# Patient Record
Sex: Female | Born: 1955 | Race: White | Hispanic: No | Marital: Married | Smoking: Never smoker
Health system: Northeastern US, Academic
[De-identification: ages and names within clinical notes are randomized; demographics above are authoritative.]

---

## 2015-05-04 ENCOUNTER — Ambulatory Visit

## 2015-05-13 ENCOUNTER — Ambulatory Visit

## 2015-05-13 NOTE — Progress Notes (Signed)
Primary Provider:  [name] MD      History of Present Illness:  [name] is a 60 Year Old Female who presents today for: 6 month f/u   Specialists seen since last visit? Dr Sherlean Foot  Has previsit planning and a huddle been performed on this patient? yes ..................................................................Marland KitchenJoannie Springs RN  May 13, 2015 9:23 AM     This note is generated with a voice-activated system, transcription errors can occur.    here for 6 month check.  Last seen by me in November 2016.  Seen by endocrine on 11/30/14.  History of osteoporosis, had been on alendronate and Actonel.  Advised no treatment now, follow-up one year.  Spine T score better then 2011  Labs done.    GI, on Prilosec.  In November 2015 B12 and magnesium were normal    Citalopram check.  On 20 mg daily.  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 each day  OMEPRAZOLE 20 MG CPDR: 1 by mouth daily  IBUPROFEN 600 MG TABS: 1 by mouth every six hours as needed for pain.  Take with food    Current Allergies- Reviewed during today's visit  PCN (Critical)  SULFA (Critical)  *  TETANUS (Critical)  * FLU VACCINATION (Critical)  * SEASONAL (Mild)    Past Medical History  No hx of mental illness    Surgical History  none  Family History  FH of Colon Cancer: FATHER d 47  fa estranged from her mother at a young age  Mo alive 31 arthritis in [place]  bro d lymphoma  - no breast or ovarian cancer    No FH of mental illness/substance abuse     Social History  Born in [place], emigrated at age 48.  Marital Status:  married--her husb at [employer]  Children:  4  Lives With:  husb, grandson, 2 youngest children.  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, community in [place]   one child with drug problem: h/o incarcerated for flunking drug tests and not coming for prob officer appt  now in jail for 2 yrs  96 yo grandson  Occupation: shift leader at [employer]  during day takes care of her grandchild  65 yo (daughters child) 4 d  Personnel officer of maintenance at [employer]      Risk Factors  Smoking Status:never smoked  Passive smoke exposure: No    Drug use: no  Alcohol use: no  HIV high risk behavior: no    Exercise: No  Times per week: no  Exercise Comments:  "when I can"  Caffeine (drinks/day): 1  Sun exposure: rarely  Seatbelt use (%): 100            Vital Signs     Weight: 118.20 lb. Height: 59  in.    BMI: 23.96  BSA: 1.48    Wt chg: -0.80  Weight: 119 lbs   BMI: 24.12 on 11/30/2014  Temperature: 98.01 deg F.     Temp Site: oral    Pulse rate: 80    Pulse rhythm: regular    Respirations: 16  On Oxygen? No  BP: 112/78, right arm      Patient is experiencing Pain  Location: left shoulder    Intensity: 8    Type: aching    Duration: constant    Comments: Contact list- in chart  Health care proxy- in chart  Med Compliance and SE's: Pt is compliant with meds with no side effects   Pharmacy- correct   Over the counter medications, vitamins or herbal supplements?   citracal, multivitamins, omeprazole, lutein   Pain in left shoulder x many months.     Medications and Allergies Reviewed    Signed: Joannie Springs RN....May 13, 2015 9:25 AM  PHQ 2    Over the last 2 weeks, how often have you been bothered by any of the following problems?  1. Little interest or pleasure in doing things:  0   - Not at all  2. Feeling down, depressed, or hopeless:  0   - Not at all    Barriers of Learning:  Visual, Language, Other  Comments(Visual): glasses  Comments(Language): English, Portugeses  Comments(Other): Feels Safe in her life            Physical Exam    General:      well developed, well nourished, in no acute distress.    Lungs:      clear bilaterally to auscultation.    Heart:      non-displaced PMI, chest non-tender; regular rate and rhythm, S1, S2 without murmurs, rubs, or gallops  Extremities:      left shoulder decr ROM  abduction to 80.  Markedly guarding movement of the left shoulder.   unable to tolerate any pressure on forward extended left arm    Psych:      overall doing well.  Does not cry during the visit.  Steady tone of voice.         Assessment and Plan:     next appointment already scheduled  12/16/15  Mammogram: MAMDIGSC on 04/06/2015.  Bone Density: Complete on 11/16/2014  Colonoscopy: Complete on 08/14/2012.  PAP: SOUT on 11/11/2013.  Hemoccult: negative on 11/23/2010  Above represents pre-visit preparations for this patient.     ~ EPIGASTRIC PAIN:    Symptoms minimal now.  Only takes the omeprazole periodically.  But increase omeprazole to taking daily while you are taking the ibuprofen.     ~ ADJUSTMENT DISORDER WITH ANXIOUS MOOD:    things going better at home and at work.  Hopes to adjust her work hours.  Son is doing remarkably well.  Life is more stable.     ~ OSTEOPOROSIS:    followed by endocrine.  At this point not on any osteoporosis medication.  Plan to repeat bone density in 2018       ~ SHOULDER PAIN, LEFT  x-ray today.  Rotator cuff tear versus impingement syndrome.  Begin ibuprofen 600 mg 3 times daily always with food for 10 days.  If  not improving follow-up with shoulder specialist  orthopedic doctor name given        Impression:            No acute osseous abnormality is identified. Slight lateral downsloping      of the acromion which can cause impingement type symptoms with      shoulder abduction. AC joint osteoarthritis.            Dictated: 05/13/15 1021    Med Compliance and SE's: Pt is compliant with meds with no side effects   Patient/Caregiver understand instructions and plan.    New/Revised  Medications Today:   IBUPROFEN 600 MG TABS (IBUPROFEN) 1 by mouth every six hours as needed for pain.  Take with food        Patient Instructions    next appointment already scheduled  12/16/15  Mammogram: MAMDIGSC on 04/06/2015.  Bone Density: Complete on 11/16/2014  Colonoscopy: Complete on 08/14/2012.  PAP: SOUT on 11/11/2013.  Hemoccult: negative on 11/23/2010  Above represents pre-visit preparations for this patient.     ~ EPIGASTRIC PAIN:    Symptoms minimal now.  Only takes the omeprazole periodically.  But increase omeprazole to taking daily while you are taking the ibuprofen.     ~ ADJUSTMENT DISORDER WITH ANXIOUS MOOD:    things going better at home and at work.  Hopes to adjust her work hours.  Son is doing remarkably well.  Life is more stable. no changes made in citalopram.  Discussed further at next visit     ~ OSTEOPOROSIS:    followed by endocrine.  At this point not on any osteoporosis medication.  Plan to repeat bone density in 2018       ~ SHOULDER PAIN, LEFT  x-ray today.  Rotator cuff tear versus impingement syndrome.  Begin ibuprofen 600 mg 3 times daily always with food for 10 days.  If not improving follow-up with shoulder specialist orthopedic doctor name given.      Prescriptions:  IBUPROFEN 600 MG TABS (IBUPROFEN) 1 by mouth every six hours as needed for pain.  Take with food  #30 x 1   Entered and Authorized by: [name] MD   Signed by: [name] MD on 05/13/2015   Method used: Electronically to      [facility] (retail)     [address] [place],  [place]  [id number]     Ph: [phone]     Fax: [phone]   RxID: [id number]  OMEPRAZOLE 20 MG CPDR (OMEPRAZOLE) 1 by mouth daily  #30[Unspecified] x 3   Entered by: Joannie Springs RN   Authorized by: [name] MD   Signed by: [name] MD on 05/13/2015   Method used: Electronically to      [facility] (retail)     [address] [place], [place]  [id number]     Ph: [phone]     Fax: [phone]   RxID: [id number]  CITALOPRAM HYDROBROMIDE 20 MG TABS (CITALOPRAM HYDROBROMIDE) one p.o. each day  #30 x 6   Entered by: Joannie Springs RN   Authorized by: [name] MD   Signed by: [name] MD on 05/13/2015   Method used: Electronically to      [facility] (retail)     [address] [place], [place]  [id number]     Ph: [phone]     Fax: [phone]  RxID: [id number]

## 2015-05-13 NOTE — Progress Notes (Signed)
[facility] - [name], MD   [address]   [place], [place] [address]  Office: [phone] Fax: [phone]          05/13/2015    [name]  [address]  [place], [place]  [address]    Dear [name]:    The x-rays of the left shoulder showed no fracture.  There is signs of impingement and/or rotator cuff abnormalities.  If the ibuprofen does not help please follow up with orthopedics.            Sincerely,          [name] MD

## 2015-05-16 ENCOUNTER — Ambulatory Visit

## 2015-05-16 NOTE — Progress Notes (Signed)
[facility] - [name], MD   [address]   [place], [place] [address]  Office: [phone] Fax: [phone]          05/16/2015    Kashina Edward Jolly  [address]  [place], [place]  [address]    Dear Ms. Edward Jolly:    The x-ray of the left shoulder shows signs of rotator cuff issues and possible nerve impingement.  Please follow up with orthopedics if you are not improving on the ibuprofen.  I have given you the name of the specialist.            Sincerely,          [name] MD

## 2015-05-17 ENCOUNTER — Ambulatory Visit

## 2015-05-17 NOTE — Progress Notes (Signed)
Referring MD: Dr. [name]     Type of Visit:  New Patient     Chief Complaint: Left Shoulder Pain       History of Present Illness  Dear Dr Parke Poisson,     Thank you for referring your patient [name] for evaluation of her shoulder pain.    As you know, [name] is a 60 year-old RHD female who presents with five months of left shoulder pain and limited range of motion. Today she presents with a localized stabbing pain on the superior lateral aspect of her shoulder. It does not radiate. She rates the pain seven 8/10. Subjective shoulder value is 80%. She has pain when putting on a jacket as well as reaching behind her. She has had no change last five months. She is tried heat and. Screams, and ibuprofen. These seemed to help somewhat. Pain is worse at night. She denies numbness or tingling in her hand. She occasionally gets neck pain.    Past Medical History  No Significant Medical History    Past Surgeries  No Past Surgery  Current Medication List:  1. Ibuprofen 600 mg oral tabs   2. Omeprazole 20 mg oral cpdr   3. Citalopram hydrobromide 20 mg oral tabs       Current Drug Allergies    PENICILLIN  SULFA  TETANUS  FLU VACCINE  Done    Social History   Marital Status: Married  Risk Factors   Tobacco use: never smoker  Alcohol use: Never  Drug use: Never  Exercise/Athletics: Yes  Family History   Cancer    Review of Systems   Cardio: Negative Endo: Negative ENT: Negative Eyes: Negative  GI: Acid Reflux Heme/Lymph: Negative Skin: Negative Neuro: Negative  Psych: Negative Resp: Negative Uro: Negative Other: Patient provided educational material regarding their condition including BMI and smoking cessation as necessary.     Weight: 118 lb   Height: 59 in  BMI: 23.92  Blood Pressure: 122 / 70 mm Hg    Physical Exam   [name] is a well appearing female in no acute distress. She is alert and oriented times 3. She is walking without a limp. Station is normal.  Cooperative with exam. Mood and affect are normal.    Left shoulder: Skin is  intact, no erythema or ecchymosis. Nontender palpation over the a.c. joint and biceps. Active forward flexion 120, external rotation 60, internal rotation to PSIS. Pain without overt weakness with supraspinatus testing in empty can position, no pain with internal and external rotation with her elbow at her side. Positive speeds test as well as O'Brien's sign. Negative Yocum sign. Negative Hawkins test. Negative apprehension test. She is neurovascular intact distally in her hand.    Right shoulder: Skin intact, no ecchymosis or erythema.  Supple range of motion- active forward flexion 160, external rotation 60, internal rotation to T12.  Intact strength and no pain with supraspinatus testing in empty can position as well as internal/external rotation with elbows at her side.  Negative belly press.  Negative speed's test.  Nontender palpation AC joint and biceps.  Negative apprehension sign.  Neurovascular intact distally in her hand.  Her fingers are warm and well perfused.      Diagnostic Tests   X-Ray Evaluation: Prior xr and report reviewed: Degenerative changes at the [facility] joint, glenohumeral joint space maintained, downsloping acromion.       Assessment and Plan   I discussed my findings and reviewed the x-ray with [name]. We  discussed the possible etiology of her left shoulder pain likely adhesive capsulitis with an underlying rotator cuff tendinitis and biceps tendinitis. Overall her shoulder today is quite stiff, in addition to having symptoms of rotator cuff tendinitis. We discussed these diagnoses. We discussed treatment options particularly for the adhesive capsulitis including anti-inflammatories, cortisone injection, and a home stretching program versus physical therapy. She would like to hold off on the cortisone injection at this time. She will work on a stretching exercise program. I encouraged her to do this three times per day. I'll plan to see her back in two months time for repeat evaluation. If  she would like a cortisone injection, encouraged her to follow up sooner. Her questions were satisfactorily answered. She is in agreement with the plan.    New Orders  Added new Service order of [address] - Detailed Low [phone]) - Signed        cc: Dr. [name]            E-Mail   Subject: A secure message from your provider's office        Electronically signed by Stann Mainland MD on 05/22/2015 at 3:04 PM  ________________________________________________________________________

## 2015-10-22 ENCOUNTER — Ambulatory Visit

## 2015-10-22 NOTE — Telephone Encounter (Signed)
Phone Note -     Pharmacy    **Refill Request Only**    Initial call taken by: Kathaleen Grinder,  October 22, 2015 9:56 AM  Initial Details of Call:  fax request- omeprazole 20mg  1xday #30  [facility] [address] [place] [phone]      Follow-up #1  Details: Last Appointment-05-13-2015  Next Appointment-12-16-2015  Last Refill-05-13-2015     #30 x3  Pharmacy is correct-yes    By: Dorthey Sawyer RN ~ October 22, 2015 10:20 AM    Prescriptions:  OMEPRAZOLE 20 MG CPDR (OMEPRAZOLE) 1 by mouth daily  #30[Capsule] x 2   Entered by: Dorthey Sawyer RN   Authorized by: Sharin Mons MD   Signed by: Sharin Mons MD on 10/22/2015   Method used: Electronically to      [facility] (retail)     [address] [place], [place]  [id number]     Ph: [phone]     Fax: [phone]   RxID: [id number]        Medications:  Rx of OMEPRAZOLE 20 MG CPDR (OMEPRAZOLE) 1 by mouth daily;  #30[Capsule] x 2;  Signed;  Entered by: Dorthey Sawyer RN;  Authorized by: Sharin Mons MD;  Method used: Electronically to [facility] [address], [place], [place]  [id number], Ph: [phone], Fax: [phone]

## 2015-11-09 ENCOUNTER — Ambulatory Visit

## 2015-11-09 NOTE — Progress Notes (Signed)
Endocrinology and Diabetes  [facility]  [address]  [place], [place] [address]  Phone:  [phone]  Fax:  [phone]        November 09, 2015      [name]  [address]  [place], [place] [address]    Dear Ms. Edward Jolly:      Please be advised that due to some conflicts in our schedule, we need to change your scheduled appointment on 11/29/2015 at 9:00 AM.    We apologize for any inconvenience this may cause you.   We have moved your appointment to 01/11/2016 at 8:45 AM.    If you cannot make this appointment, please call our office, and we will be more than happy to reschedule for another date and/or time that is more accommodating to your needs.    Again, thank you for your understanding and anticipated cooperation.     Sincerely,        Rosina Lowenstein, MD  [facility]  [facility] [place] [facility]  [facility]   [facility]

## 2015-12-07 ENCOUNTER — Ambulatory Visit

## 2015-12-16 ENCOUNTER — Ambulatory Visit

## 2015-12-16 NOTE — Progress Notes (Signed)
Clinical Lists Changes    Directives:  Removed directive of CONTACT INFORMATION 10/24/2012 (UPDATED 04/30/14), UPDATED 05/13/15  Added new directive of NEW CONTACT INFORMATION 12/16/15

## 2015-12-16 NOTE — Progress Notes (Signed)
Primary Provider:  [name] MD      History of Present Illness:  [name] is a 60 Year Old Female who presents today for: Complex  Specialists seen since last visit? none  Has previsit planning and a huddle been performed on this patient?yes  ..................................................................Marland KitchenEsau Grew  December 16, 2015 9:01 AM    here for comprehensive visit  Osteoporosis treated intermittently now on break.  Had seen endocrinology 11/2014.  Plan to recheck bone density in 11/2016 and hold off on restarting osteoporosis medicine now  On daily Prilosec for gastroesophageal reflux  Left shoulder impingement saw shoulder orthopedics Dr. Michail Sermon 05/17/15.  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  Cortisone injection at subsequent visit was v helpful.Return in 2 months    Mood disorder on citalopram.  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  Take with food  ERYTHROMYCIN 5 MG/GM OPHTHALMIC OINTMENT: 1/4" to left eye 3 x a day for 5 day  FAMOTIDINE 20 MG ORAL TABLET: 1 tab a day    Current Allergies- Reviewed during today's visit  PCN (Critical)  SULFA (Critical)  * TETANUS (Critical)  * FLU VACCINATION (Critical)  * SEASONAL (Mild)    Past Medical History  No hx of mental illness    Surgical History  none  Family History  FH of Colon Cancer: FATHER d 39  fa estranged from her mother at a young age  Mo alive 29 arthritis in [place]  bro d lymphoma  - no breast or ovarian cancer    No FH of mental illness/substance abuse     Social History  Born in [place], emigrated at age 40.  Marital Status:  married--her husb at [employer]  Children:  4  Lives With:  husb, grandson, 2 youngest children.  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, community in [place]   one child with drug problem: h/o incarcerated for flunking drug tests and not coming for prob officer appt  now in jail for 2 yrs  15 yo grandson  Occupation: shift leader at [employer]  during day takes care of her grandchild  62 yo (daughters child) 4 d  Personnel officer of maintenance at [employer]      Risk Factors  Smoking Status:never smoked  Passive smoke exposure: No    Drug use: no  Alcohol use: no  HIV high risk behavior: no    Exercise: No  Times per week: no  Exercise Comments:  "when I can"    Caffeine (drinks/day): 1  Sun exposure: rarely  Seatbelt use (%): 100            Review of Systems     Gen:  normal energy, sleep  Eye: No changes in vision  ENT: No changes in hearing  Card: No chest pain, pressure, , palpitations, syncope. some shortness of breath.  Pulm: No cough, sputum production, pain with inspiration  GI:  No dyspepsia, abdominal pain, change in bowel habits, blood in stool.  GU: No hesitancy, nocturia, incontinence.  Musc-Skel:  no spine /jt/musc pain  Neuro:  No headaches, dizziness,  memmory loss, falls.  Psych: No anxiety or depression--wants to try to taper celexa  Vital Signs     Weight: 118 lb. Height: 59  in.    BMI: 23.92  BSA: 1.47    Wt chg: -0.20  Weight: 118.20 lbs   BMI: 23.96 on 05/13/2015  Temperature: 98.2 deg F.   Pulse rate: 72    Respirations: 15  BP: 132/80, right arm      Patient is not experiencing pain    Comments: Contact list- in chart  Health care proxy- in chart  Med Compliance and SE's: Pt is compliant with meds with no side effects   Pharmacy- correct   Over the counter medications, vitamins or herbal supplements? calcium, multi  Medications and Allergies Reviewed    Signed: Esau Grew.Marland KitchenMarland KitchenMarland KitchenDecember  7, 2017 9:16 AM  PHQ 2    Over the last 2 weeks, how often have you been bothered by any of the following problems?  1. Little interest or pleasure in doing things:  0   - Not at all  2. Feeling down, depressed, or hopeless:  0   - Not at all    Barriers of Learning:  Visual, Other  Comments(Visual): glasses  Comments(Other): Feels Safe in her life.            Physical Exam    GENERALl:: WD/WN female, in no acute distress  EYE: PERRLA , no conjunctiva abnormality  left eye lower lid inner aspect started developing on outer part of the eye lid-red raised area  ENT: EAC normal, TMs negative.  No sinus tenderness.  Pharynx negative.  NECK: Normal thyroid, no nodules, no adenopathy.  CHEST WALLl: No tenderness, no flank tenderness.   BREASTS: No masses/tender/dimpling  LUNGS: Clear throughout.  No adventitious sounds, moves air well.  COR: S1, S2 normal.  Regular rhythm, no murmurs.  ABDOMEN: Soft, nontender, intact bowel sounds. No hepatosplenomegaly.    Vascular: no carotid bruits.  Extremities: No edema.  No venous stasis change  NEURO oriented, alert, no focal motor deficits  dtr's intact gait intact  Psych  pleasant appropriate, normal affect-no evidence of depression or anxiety.           Assessment and Plan:     follow-up visit 6 months  Defers the flu shot--History  of severe local reaction  Mammogram: MAMDIGSC on 04/06/2015     Pap: SOUT on 11/11/2013.  Bone Density: Complete on 11/16/2014.-- next 11/2016  Colonoscopy: Complete on 08/14/2012.--next 2019  Above represents pre-visit preparations for this patient.     ~ SHOULDER PAIN, LEFT (M25.512):    benefited greatly from cortisone injection.  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  Substitute with famotidine/Pepcid 20 mg a day.  Hopefully insurance will cover this.  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  Better schedule.  Family doing quite well.  Advise decrease citalopram to 15 mg a day for one month.  If you're continuing to do well may drop to 10 mg a day.  Follow-up appointment with me in 6 months.     ~ OSTEOPOROSIS (M81.0):    has appointment to follow up with endocrine on 01/11/16.  Laboratory testing ordered by them.  Bone density to be done at the end of 2018     ~ HORDEOLUM EXTERNUM, LEFT (H00.016):    discussed using baby shampoo Ilotycin ointment for 5 days.  Call if no better      Med Compliance and SE's: Pt is compliant with meds with no side effects   Patient/Caregiver understand instructions and plan.    New/Revised  Medications Today:   CITALOPRAM HYDROBROMIDE 10 MG ORAL TABLET (CITALOPRAM HYDROBROMIDE) 1 1/2 tabs a day  ERYTHROMYCIN 5 MG/GM OPHTHALMIC OINTMENT (ERYTHROMYCIN) 1/4" to left eye 3 x a day for 5 day  FAMOTIDINE 20 MG ORAL TABLET (FAMOTIDINE) 1 tab a day            Patient Instructions    follow-up visit 6 months  Defers the flu shot--History of severe local reaction  Mammogram: MAMDIGSC on 04/06/2015     Pap: SOUT on 11/11/2013.  Bone Density: Complete on 11/16/2014.-- next 11/2016  Colonoscopy: Complete on 08/14/2012.--next 2019  Above represents pre-visit preparations for this patient.     ~ SHOULDER  PAIN, LEFT (M25.512):    benefited greatly from cortisone injection.  No further issues at this time     ~ ADENOMATOUS COLONIC POLYP (D12.6):    up-to-date with colonoscopy     ~ EPIGASTRIC PAIN (R10.13):    discussed stopping Prilosec/omeprazole.  Substitute with famotidine/Pepcid 20 mg a day.  Hopefully insurance will cover this.  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  Better schedule.  Family doing quite well.  Advise decrease citalopram to 15 mg a day for one month.  If you're continuing to do well may drop to 10 mg a day.  Follow-up appointment with me in 6 months.  EKG done today   ~ OSTEOPOROSIS (M81.0):    has appointment to follow up with endocrine on 01/11/16.  Laboratory testing ordered by them.  Bone density to be done at the end of 2018    STYE in the left eye.  Use Ilotycin eye ointment 3 times a day for 5 days.  Use baby shampoo with drops of warm water to cleanse the eye.  Before putting on the eye ointment.      Prescriptions:  CITALOPRAM HYDROBROMIDE 10 MG ORAL TABLET (CITALOPRAM HYDROBROMIDE) 1 1/2 tabs a day  #50[Unspecified] x 5   Entered and Authorized by: [name] MD   Signed by: [name] MD on 12/16/2015   Method used: Electronically to      [facility] (retail)     [address] [place], [place]  [id number]     Ph: [phone]     Fax: [phone]   RxID: [id number]  FAMOTIDINE 20 MG ORAL TABLET (FAMOTIDINE) 1 tab a  day  #30[Unspecified] x 2   Entered and Authorized by: [name] MD   Signed by: [name] MD on 12/16/2015   Method used: Electronically to      [facility] (retail)     [address] [place], [place]  [id number]     Ph: [phone]     Fax: [phone]   RxID: [id number]  ERYTHROMYCIN 5 MG/GM OPHTHALMIC OINTMENT (ERYTHROMYCIN) 1/4" to left eye 3 x a day for 5 day  #1[Tube] x 0   Entered and Authorized by: [name] MD   Signed by: [name] MD on 12/16/2015   Method  used: Electronically to      [facility] (retail)     [address] [place], [place]  [id number]     Ph: [phone]     Fax: [phone]   RxID: [id number]

## 2015-12-17 ENCOUNTER — Ambulatory Visit

## 2016-01-11 ENCOUNTER — Ambulatory Visit

## 2016-01-11 ENCOUNTER — Ambulatory Visit: Admitting: Internal Medicine

## 2016-01-11 LAB — HX BASIC METABOLIC PANELX
HX ANION GAP: 17 mmol/L (ref 9.0–19.0)
HX BICARBONATE: 25 mmol/L (ref 23.0–31.0)
HX BUN: 14 mg/dL (ref 8.0–23.0)
HX CALCIUM: 9.7 mg/dL (ref 8.5–10.5)
HX CHLORIDE: 100 mmol/L (ref 98.0–110.0)
HX CREATININE: 0.7 mg/dL (ref 0.4–1.2)
HX GLOMERULAR FILTRATION RATE: 85
HX GLUCOSE: 118 mg/dL — ABNORMAL HIGH (ref 70.0–100.0)
HX HEMOLYSIS INDEX: 14 mg/dL (ref 0.0–50.0)
HX ICTERIC INDEX: 1 (ref 0.0–2.0)
HX LIPEMIC INDEX: 8 mg/dL (ref 0.0–40.0)
HX POTASSIUM: 4.7 mmol/L (ref 3.6–5.3)
HX SODIUM: 142 mmol/L (ref 137.0–146.0)

## 2016-01-11 LAB — HX VITAMIN D,25 HYDROXY: HX VITAMIN D,25 HYDROXY: 30.6 ng/mL (ref >=30)

## 2016-01-11 NOTE — Progress Notes (Signed)
ENDOCRINOLOGY ENCOUNTER    PCP: [name] MD  Chief Complaint: Osteoporosis  HPI: [name]    89 F with a history of GERD referred in endocrine consultation for osteoporosis at the request of PCP.   She was on alendronate from 2009 to 03-2013.  Had been on risedronate before that. 09-2014 BMD with T scores -2.9 in AP spine and -1.7 in left femur.  She has no history of fracture.  The patient reports puberty occurred at age 61, similar to peers. Periods were regular.  She is G5P4.  Menopause occurred at age 81.  Was briefly on HRT at that time.    At around age 85 she was diagnosed with osteoporosis.  Her dairy intake: occasional yogurt, has almond milk every night.   She is on a Citracal with vitamin D twice daily.  She reports no extended period of prednisone use. She has no history of kidney stones.  She reports no constipation.   No polyuria.    Problem List:   HORDEOLUM EXTERNUM, LEFT (ICD-373.11) (ICD10-H00.016)  SHOULDER PAIN, LEFT (ICD-719.41) (ICD10-M25.512)  PREVENTIVE HEALTH CARE (ICD-V70.0) (ICD10-Z00.00)  BLOOD IN STOOL (ICD-578.1) (XBM84-X32.1)  ADENOMATOUS COLONIC POLYP (ICD-211.3) (ICD10-D12.6)  INSOMNIA (ICD-780.52) (ICD10-G47.00)  EPIGASTRIC PAIN (ICD-789.06) (ICD10-R10.13)  Hx of CHEST PAIN (ICD-786.50) (ICD10-R07.9)  ADJUSTMENT DISORDER WITH ANXIOUS MOOD (ICD-309.24) (ICD10-F43.22)  OSTEOARTHRITIS (ICD-715.90) (ICD10-M19.90)  OSTEOPOROSIS (ICD-733.00) (ICD10-M81.0)  ALLERGY (ICD-995.3) (ICD10-T78.40)  WEIGHT GAIN (ICD-783.1) (ICD10-R63.5)  FAMILY HISTORY OF COLON CANCER (ICD-V16.0) (ICD10-Z80.0)      Medication List:   CITRACAL/VITAMIN D TABLET (CALCIUM CITRATE-VITAMIN D TABS) one tab daily  MULTIVITAMINS TABS (MULTIPLE VITAMIN) once daily  CITALOPRAM HYDROBROMIDE 10 MG ORAL TABLET (CITALOPRAM HYDROBROMIDE) 1 1/2 tabs a day  OMEPRAZOLE 20 MG ORAL CAPSULE DELAYED RELEASE (OMEPRAZOLE) 1 by mouth daily  IBUPROFEN 600 MG ORAL TABLET (IBUPROFEN) 1 by mouth every six hours as needed for pain.   Take with food  ERYTHROMYCIN 5 MG/GM OPHTHALMIC OINTMENT (ERYTHROMYCIN) 1/4" to left eye 3 x a day for 5 day  FAMOTIDINE 20 MG ORAL TABLET (FAMOTIDINE) 1 tab a day      Allergies:    PCN, SULFA, * TETANUS, * FLU VACCINATION, * SEASONAL.      Histories:   Past Medical History: No hx of mental illness    Past Surgical History: none  Social History: Born in [place], emigrated at age 80.  Marital Status:  married--her husb at [employer]  Children:  4  Lives With:  husb, grandson, 2 youngest children.  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 No cold intolerance.  No diarrhea or constipation. No change in hair or skin. No dysphagia, no odynophagia, no solid food or pill dysphagia. No  diplopia/blurry vision.No fevers/chills.No change in voice.No anterior neck pressure. No increase in neck size.No wheeze or DOE.No rash.No edema. No cough.No anxiety/Depression. All other pertinent systems reviewed and are negative      Vital Signs   Weight: 117 lb. (53.18 kg.)  (Weight change since last visit: -1 lbs.)    Height: 59 in. (149.86 cm.)  BMI: 23.72    Blood Pressure: 123 /75 mm Hg.  Postion: sitting  Pulse Rate: 76  Comment: right arm    [facility] [place]......................January 11, 2016 9:00 AM        Physical Exam:   General: General: appears well,NAD  Vitals reviwed-stable  HEENT: No thyromegaly, EOMI  CVS: no  peripheral edema  Resp:norm resp effort  Extremities: No lower extremity edema  Skin: intact, no rash  MS: ambulates well  Neuro: no tremor  Psych: Normal mood and affect.          Laboratory Results:   TSH: 0.80 (05/25/2010 10:17:00 AM)  Vit D 25 37.0 (12/01/2014 8:52:00 AM)  ESR 10 (07/17/2012 10:24:00 AM)  Glucose: 104 (12/01/2014 8:52:00 AM)  Last eye exam: Done  Total Testosterone: Done  Sodium: 142 (12/01/2014 8:52:00 AM)  Potassium: 4.5 (12/01/2014 8:52:00 AM)  Chloride: 101 (12/01/2014 8:52:00 AM)  Bicarbonate 27 (12/01/2014 8:52:00 AM)  BUN: 12 (12/01/2014 8:52:00 AM)  Creatinine: 0.7 (12/01/2014 8:52:00 AM)  Serum Calcium: 9.9 (12/01/2014 8:52:00 AM)  Serum Magnesium: 2.1 (11/11/2013 10:47:00 AM)  Cholesterol: 225 (11/17/2014 7:13:00 AM)  LDL: 115 (11/17/2014 7:13:00 AM)  HDL: 87 (11/17/2014 7:13:00 AM)  Triglyceride: 113 (11/17/2014 7:13:00 AM)    Assessment & Plan:  66 F with a history of GERD referred in endocrine consultation for osteoporosis at the request of PCP.   She was on alendronate from 2009 to 03-2013.  Had been on risedronate before that. 09-2014 BMD with T scores -2.9 in AP spine and -1.7 in left femur.  She has no history of fracture.  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   She was on alendronate for 6 years.  Her spinal BMD has declined since 2014 but probably not significant-it is still higher that it was compared to 2011. Hip BMD essentially stable.  We will check urine NTX to assess again bone turnover and, unless elevated, likely will continue drug holiday.   Will repeat again 11-2016.  We will repeat the bone densitometry in 09-2016.    In the meantime, discussed goal of 1200 mg daily calcium via diet or pill.  A handout was provided (see instructions).   Gets vitamin D via citracal-D and MVI. Recent kidney function, calcium and vitamin D normal.  Will repeat in one year.  Follow up in one year  Copy to referring  provider.    Thank you for involving me in the care of your patient.    Kind regards,    Rosina Lowenstein, MD    Copy of note sent to referring provider  Note generated with dictation software and may contain errors

## 2016-01-12 LAB — HX NTX-TELOPEPTIDE, 24HR URINE
HX NTX CREAT: 38 mg/dL
HX NTX RESULTS: 120 nmol/L
HX NTX-TELOPEPTIDE: 36

## 2016-01-14 ENCOUNTER — Ambulatory Visit

## 2016-01-14 NOTE — Telephone Encounter (Signed)
Phone Note -       Initial call taken by: Rosina Lowenstein MD,  January 14, 2016 1:18 PM  Initial Details of Call:  please let patient know  labs look fine  no need to restart osteop medication for now      Follow-up #1  Details: SPOKE WITH PT  By: Letha Cape Goldthwaite ~ January 17, 2016 7:55 AM

## 2016-04-17 ENCOUNTER — Ambulatory Visit

## 2016-06-15 ENCOUNTER — Ambulatory Visit

## 2016-06-15 NOTE — Progress Notes (Signed)
Primary Provider:  [name] MD      History of Present Illness:  [name] is a 61 Year Old Female who presents today for: Follow-Up Visit  Specialists seen since last visit?     Dr Kathrynn Humble  Has previsit planning and a huddle been performed on this patient?     yes  ..................................................................Marland KitchenDorthey Sawyer RN  June 15, 2016 9:25 AM     This note is generated with a voice-activated system, transcription errors can occur.    presenting today in follow-up.--endocrine generator 2018.  Dr. Antony Blackbird.  Advised she could stay off osteoporosis medicine because scores overall were stable. BD 11/18 scheduled.  On citalopram doing well--tapering --now has been at 5 mg a day  works 20 hrs a week Mon Wed thurs  has all weekends off  Issues with left shoulder impingement in the past post cortisone shots.--no complaints  Gastroesophageal reflux on omeprazole--now better  Due for colonoscopy in August 2019  Due for Pap smear this year    allergies are very bad this yr  dry cough  h/a rare        Current Problems  SHOULDER PAIN, LEFT  PREVENTIVE HEALTH CARE  BLOOD IN STOOL  ADENOMATOUS COLONIC POLYP  INSOMNIA  EPIGASTRIC PAIN  CHEST PAIN  ADJUSTMENT DISORDER WITH ANXIOUS MOOD  OSTEOARTHRITIS  OSTEOPOROSIS  ALLERGY  WEIGHT GAIN  FAMILY HISTORY OF COLON CANCER    Current Medications- Reviewed during today's visit  CITRACAL/VITAMIN D TABLET (CALCIUM CITRATE-VITAMIN D TABS): one tab daily  MULTIVITAMINS TABS (MULTIPLE VITAMIN): once daily  CITALOPRAM HYDROBROMIDE 10 MG ORAL TABLET: 1 1/2 tabs a day  OMEPRAZOLE 20 MG ORAL CAPSULE DELAYED RELEASE: 1 by mouth daily a needed  IBUPROFEN 600 MG ORAL TABLET: 1 by mouth every six hours as needed for pain.  Take with food  FAMOTIDINE 20 MG ORAL TABLET: 1 tab a day    Current Allergies- Reviewed during today's visit  PCN (Critical)  SULFA (Critical)  * TETANUS (Critical)  * FLU VACCINATION (Critical)  * SEASONAL (Mild)    Past Medical  History  No hx of mental illness    Surgical History  none  Family History  FH of Colon Cancer: FATHER d 88  fa estranged from her mother at a young age  Mo alive 18 arthritis in [place]  bro d lymphoma  - no breast or ovarian cancer    No FH of mental illness/substance abuse     Social History  Born in [place], emigrated at age 82.  Marital Status:  married--her husb at [employer]  Children:  4  Lives With:  husb, grandson, 2 youngest children.  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 Height: 59  in.    BMI: 23.80  BSA: 1.47    Wt chg: 0.40  Weight: 117 lbs   BMI: 23.72 on 01/11/2016  Temperature: 98.2 deg F.     Temp Site: oral    Pulse rate: 64    Pulse rhythm: regular    Respirations: 16  On Oxygen? No  Pulse Ox (SpO2): 99 BP: 126/70, right arm      Patient is not experiencing pain    Comments: Contact list- yes  Health care proxy-  No over the counter medications. Takes OTC Vitamins  Med Compliance and SE's: Pt is compliant with meds with no side effects correct  Pharmacy-[facility]    Medications and Allergies Reviewed    Signed: Dorthey Sawyer RN....June 15, 2016 9:28 AM  PHQ 2    Over the last 2 weeks, how often have you been bothered by any of the following problems?  1. Little interest or pleasure in doing things:  0   - Not at all  2. Feeling down,  depressed, or hopeless:  0   - Not at all    Barriers of Learning:  Visual, Other  Comments(Visual): wears eye glasses        Physical Exam    General:      well developed, well nourished, in no acute distress.    Ears:      TM's intact and clear with normal canals with grossly normal hearing.    Mouth:      no deformity or lesions with good dentition.    Lungs:      clear bilaterally to auscultation.    Heart:      non-displaced PMI, chest non-tender; regular rate and rhythm, S1, S2 without murmurs, rubs, or gallops  Abdomen:       normal bowel sounds; no hepatosplenomegaly no ventral,umbilical hernias or masses noted.    Extremities:      no ankle edema  Psych:      pleasant relaxed         Assessment and Plan:     follow up 12/21/16  shingrix vaccine given  Mammogram: MGSCRTOMO on 04/17/2016    on .  Pap: SOUT on 11/11/2013.---plan to do it next visit  Bone Density: Complete on 11/16/2014.  Colonoscopy: Complete on 08/14/2012.--due in August 2019 will need new gastroenterologist  Above represents pre-visit preparations for this patient.     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  Planning to bring her mother to live with her from [place].  Advise decrease Celexa from 5 mg daily to 5 mg every other day for one month then stop.  Call if concerns.     ~ ALLERGY (T78.40) :    allergy symptoms pressing today.  Advised trial of Claritin/loratadine 10 mg a day.  This is a nonsedating antihistamine     ~ EPIGASTRIC PAIN (R10.13) :    symptoms much improved since she has a more manageable schedule      Med Compliance and SE's: Pt is compliant with meds with no side effects     Medications Removed Today:   ERYTHROMYCIN 5 MG/GM OPHTHALMIC OINTMENT (ERYTHROMYCIN) 1/4" to left eye 3 x a day for 5 day    New/Revised  Medications Today:   OMEPRAZOLE 20  MG ORAL CAPSULE DELAYED RELEASE (OMEPRAZOLE) 1 by mouth daily a needed            Patient Instructions    follow up 12/21/16  shingrix vaccine  given  Mammogram: MGSCRTOMO on 04/17/2016    on .  Pap: SOUT on 11/11/2013.---plan to do it next visit  Bone Density: Complete on 11/16/2014.  Colonoscopy: Complete on 08/14/2012.--due in August 2019 will need new gastroenterologist  Above represents pre-visit preparations for this patient.     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  Planning to bring her mother to live with her from [place].  Advise decrease Celexa from 5 mg daily to 5 mg every other day for one month then stop.  Call if concerns.     ~ ALLERGY (T78.40) :    allergy symptoms pressing today.  Advised trial of Claritin/loratadine 10 mg a day.  This is a nonsedating antihistamine     ~ EPIGASTRIC PAIN (R10.13) :    symptoms much improved since she has a more manageable schedule        Prescriptions:  OMEPRAZOLE 20 MG ORAL CAPSULE DELAYED RELEASE (OMEPRAZOLE) 1 by mouth daily a needed  #30 x 0   Entered and Authorized by: [name] MD   Signed by: [name] MD on 06/15/2016   Method used: Faxed to ...     [facility] (retail)     [address] [place], [place]  [id number]     Ph: [phone]     Fax: [phone]   RxID: [id number]            ]

## 2016-12-12 ENCOUNTER — Ambulatory Visit

## 2016-12-21 ENCOUNTER — Ambulatory Visit

## 2016-12-21 NOTE — Progress Notes (Signed)
Primary Provider:  [name] MD      History of Present Illness:  [name] is a 61 Year Old Female who presents today for: [facility]  Specialists seen since last visit? none  Is there an updated release of information to speak for the patient on file (valid for 1 year only)?   Has previsit planning and a huddle been performed on this patient? yes  ..................................................................Marland KitchenEsau Grew  December 21, 2016 9:41 AM     This note is generated with a voice-activated system, transcription errors can occur.    patient is seen today for comprehensive visit.  Osteoporosis,  Mood disorder Celexa was being tapered. she has completely stopped and doing well    Gastroesophageal reflux on omeprazole--takes as needed--us 1 x a month.    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  Take with food  FAMOTIDINE 20 MG ORAL TABLET: 1 tab a day    Past Medical History  No hx of mental illness    Surgical History  none  Family History  FH of Colon Cancer: FATHER d 107  fa estranged from her mother at a young age  Mo alive 79 arthritis in [place]  bro d lymphoma  - no breast or ovarian cancer    No FH of mental illness/substance abuse     Social History  Born in [place], emigrated at age 69.  Marital Status:  married--her husb at  [employer]  Children:  4  Lives With:  husb, grandson, 2 youngest children.  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, community in [place]   one child with drug problem: h/o incarcerated for flunking drug tests and not coming for prob officer appt  now in jail for 2 yrs  43 yo grandson  Occupation: shift leader at [employer]  during day takes care of her grandchild  38 yo (daughters child) 4 d  Personnel officer of maintenance at [employer]      Risk Factors  Tobacco User: no  Smoking Status:never smoked  Passive smoke exposure: No    Drug use: no  Alcohol use: no  HIV high risk behavior: no    Exercise: No  Times per week: no  Exercise Comments:  "when I can"    Caffeine (drinks/day): 1  Sun exposure: rarely  Seatbelt use (%): 100    Additional Data Recorded today    [facility] Exam:  [facility]  on  11/19/2016  Dental Exam:  Done  on  06/09/2016      Review of Systems     Gen:  normal energy, sleep  Eye: No changes in vision  ENT: No changes in hearing  Card: No chest pain, pressure, , palpitations, syncope. some shortness of breath.  Pulm: No cough, sputum production, pain with inspiration  GI:  No dyspepsia, abdominal pain, change  in bowel habits, blood in stool.  GU: No hesitancy, nocturia, incontinence.  Musc-Skel:  no spine /jt/musc pain  Neuro:  No headaches, dizziness, memory loss, falls.  Psych: No anxiety or depression  Vital Signs     Patient: 61 Years Old Female  Height:  59 in.  Weight: 116 lbs      Wt Chg: -1.40 since 06/15/2016  BMI:  23.51        23.80 on 06/15/2016  BP:  142/76 left arm, normal cuff, seated     126/70 on 06/15/2016     130/74  Temp:  97.7  F    oral  Pulse:  60       regular    Patient is not experiencing pain    Comments: Contact list- in chart  Health care proxy- in chart  Med Compliance and SE's: Pt is compliant with meds with no side effects   Pharmacy- correct   Over the counter medications, vitamins or herbal supplements? multi calcium  Signed: Esau Grew.Marland KitchenMarland KitchenMarland KitchenDecember 13, 2018  9:52 AM    PHQ 2    Over the last 2 weeks, how often have you been bothered by any of the following problems?  1. Little interest or pleasure in doing things:  0   - Not at all  2. Feeling down, depressed, or hopeless:  0   - Not at all        Vaccines Ordered: Zoster  Ordering Provider:  Dr. [name]  Vaccine Counselling by Provider     Vaccines Given  Zoster     Dose:  0.5 mL  Given By:  Esau Grew  Manufacturer:  Gl[employer]oSmithK[employer]     Lot Number:  [id number]     Expiration Date:  02/21/2019  Vaccine Type:  Shingrix [CVX187]  NDC Number:  [id number]  VIS Version Provided, Date:  02/21/2016     Source:  Private Purchase  Route:  IM     Site:  Left Deltoid          Physical Exam    Genitalia:      Pelvic Exam:         External: normal female genitalia without lesions or masses         Vagina: normal without lesions or masses--dried vaginal mucosa         Cervix: normal without lesions or masses         Adnexa: normal bimanual exam without masses or fullness         Uterus: normal by palpation         Pap smear: performed  Additional Exam:      GENERALl:: WD/WN female, in no acute distress  EYE: PERRLA , no conjunctiva abnormality  ENT: EAC normal, TMs negative.  No sinus tenderness.  Pharynx negative.  NECK: Normal thyroid, no nodules, no adenopathy.  CHEST WALLl: No tenderness, no flank tenderness.   BREASTS: No masses/tender/dimpling  LUNGS: Clear throughout.  No adventitious sounds, moves air well.  COR: S1, S2 normal.  Regular rhythm, no murmurs.  ABDOMEN: Soft, nontender, intact bowel sounds. No hepatosplenomegaly.    Vascular: pedal pulses intact.  No carotid bruits.  Extremities: No edema.  No venous stasis change  NEURO oriented, alert, no focal motor deficits  dtr's intact gait intact  Psych  pleasant appropriate, normal affect-no evidence of depression or anxiety.           Assessment and Plan:     defers the  flu shot--previous reaction  Shingrix vaccine #1 given today  Mammogram: MGSCRTOMO on 04/17/2016     on .  Pap: SOUT on 11/11/2013.---done today.  If normal --- no further screening Pap smears needed  Bone Density: Complete on 11/16/2014.--To schedule follow-up bone density.  Colonoscopy: Complete on 08/14/2012.  Above represents pre-visit preparations for this patient.     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  History of taking Fosamax.  Check bone density scores now.  Presently on no  Medications:  IBUPROFEN 600 MG ORAL TABLET: 1 by mouth every six hours as needed for pain.  Take with food,  FAMOTIDINE 20 MG ORAL TABLET: 1 tab a day.      ~ BLURRED VISION (H53.8) :    seen by [facility].  Reportedly given the wrong prescription.  Reportedly told that she has signs of hypertension in the eyes.  Hypertension graff given to her.  Will request  a note from [facility].     ~ MOOD DISORDER (F39) :    doing well.  On no medications.  Things are stable at home.  Monitor.      Med Compliance and SE's: Pt is compliant with meds with no side effects   Patient/Caregiver understand instructions and plan.            Patient Instructions    defers the flu shot--previous reaction  Shingrix vaccine #1 given today---shingrix vaccine #2 must be given within 6 months  Mammogram: MGSCRTOMO on 04/17/2016    on .  Pap: SOUT on 11/11/2013.---done today.  If normal --- no further screening Pap smears needed  Bone Density: Complete on 11/16/2014.--To schedule follow-up bone density.  Colonoscopy: Complete on 08/14/2012.  Above represents pre-visit preparations for this patient.     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  History of taking Fosamax.  Check bone density scores now.  Presently on no  Medications:  IBUPROFEN 600 MG ORAL TABLET: 1 by mouth every six hours as needed for pain.  Take with food,  FAMOTIDINE 20 MG ORAL  TABLET: 1 tab a day.      ~ BLURRED VISION (H53.8) :    seen by [facility].  Reportedly given the wrong prescription.  Reportedly told that she has signs of hypertension in the eyes.  Hypertension graff given to her.  Will request  a note from [facility].     ~ MOOD DISORDER (F39) :    doing well.  On no medications.  Things are stable at home.  Monitor.

## 2016-12-25 ENCOUNTER — Ambulatory Visit

## 2016-12-25 NOTE — Progress Notes (Signed)
[facility] - [name], MD   [address]   [place], [place] [address]  Office: [phone] Fax: [phone]        December 25, 2016        [name]  [address]  [place], [place]  [address]    Dear [name]      Your pap smear is normal.       Sincerely,        [name] MD

## 2017-01-15 ENCOUNTER — Ambulatory Visit

## 2017-01-15 ENCOUNTER — Ambulatory Visit: Admitting: Internal Medicine

## 2017-01-15 LAB — HX BASIC METABOLIC PANELX
HX ANION GAP: 11 mmol/L (ref 9.0–19.0)
HX BICARBONATE: 27 mmol/L (ref 23.0–31.0)
HX BUN: 13 mg/dL (ref 8.0–23.0)
HX CALCIUM: 9.7 mg/dL (ref 8.5–10.5)
HX CHLORIDE: 102 mmol/L (ref 98.0–110.0)
HX CREATININE: 0.6 mg/dL (ref 0.4–1.2)
HX GLOMERULAR FILTRATION RATE: 101
HX GLUCOSE: 96 mg/dL (ref 70.0–100.0)
HX HEMOLYSIS INDEX: 2 mg/dL (ref 0.0–50.0)
HX ICTERIC INDEX: 1 (ref 0.0–2.0)
HX LIPEMIC INDEX: 5 mg/dL (ref 0.0–40.0)
HX POTASSIUM: 4.8 mmol/L (ref 3.5–5.1)
HX SODIUM: 140 mmol/L (ref 137.0–146.0)

## 2017-01-15 LAB — HX TSH WITH REFLEX: HX TSH WITH REFLEX: 0.85 u[IU]/mL (ref 0.27–4.2)

## 2017-01-15 LAB — HX VITAMIN D,25 HYDROXY: HX VITAMIN D,25 HYDROXY: 31 ng/mL (ref 30.0–100.0)

## 2017-01-15 NOTE — Progress Notes (Signed)
ENDOCRINOLOGY ENCOUNTER    PCP: [name] MD  Chief Complaint: Osteoporosis  HPI: [name]    62 F with a history of GERD referred in endocrine consultation for osteoporosis at the request of PCP.   She was on alendronate from 2009 to 03-2013.  Had been on risedronate before that. 09-2014 BMD with T scores -2.9 in AP spine and -1.7 in left femur.      01-2017:BMD is due and scheduled for 02-2017.She has no history of fracture.She is on a Citracal with vitamin D twice daily.currently on drug holiday  The patient reports puberty occurred at age 29, similar to peers. Periods were regular.  She is G5P4.  Menopause occurred at age 72.  Was briefly on HRT at that time.    At around age 46 she was diagnosed with osteoporosis.  Her dairy intake: occasional yogurt, has almond milk every night.     She reports no extended period of prednisone use. She has no history of kidney stones.  She reports no constipation.   No polyuria.    Problem List:   VACCINE AGAINST DISEASE (ICD-V05.9) ([id number]-K93)  MOOD DISORDER (ICD-296.90) (ICD10-F39)  BLURRED VISION (ICD-368.8) (ICD10-H53.8)  PREVENTIVE HEALTH CARE (ICD-V70.0) (ICD10-Z00.00)  ADENOMATOUS COLONIC POLYP (ICD-211.3) (ICD10-D12.6)  OSTEOARTHRITIS (ICD-715.90) (ICD10-M19.90)  OSTEOPOROSIS (ICD-733.00) (ICD10-M81.0)  ALLERGY (ICD-995.3) (ICD10-T78.40)  FAMILY HISTORY OF COLON CANCER (ICD-V16.0) (ICD10-Z80.0)      Medication List:   CITRACAL/VITAMIN D TABLET (CALCIUM CITRATE-VITAMIN D TABS) one tab daily  MULTIVITAMINS TABS (MULTIPLE VITAMIN) once daily  CITALOPRAM HYDROBROMIDE 10 MG ORAL TABLET (CITALOPRAM HYDROBROMIDE) 1 1/2 tabs a day; Route: ORAL  OMEPRAZOLE 20 MG ORAL CAPSULE DELAYED RELEASE (OMEPRAZOLE) 1 by mouth daily a needed  IBUPROFEN 600 MG ORAL TABLET (IBUPROFEN) 1 by mouth every six hours as needed for pain.  Take with food; Route: ORAL  FAMOTIDINE 20 MG ORAL TABLET (FAMOTIDINE) 1 tab a day; Route: ORAL      Allergies:    PCN, SULFA, * TETANUS, * FLU  VACCINATION, * SEASONAL.      Histories:   Past Medical History: No hx of mental illness    Past Surgical History: none  Social History: Born in [place], emigrated at age 67.  Marital Status:  married--her husb at [employer]  Children:  4  Lives With:  husb, grandson, 2 youngest children.  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 No cold intolerance.  No diarrhea or constipation. No change in hair or skin. No dysphagia, no odynophagia, no solid food or pill dysphagia. No diplopia/blurry vision.No fevers/chills.No change in voice.No anterior neck pressure. No increase in neck size.No wheeze or DOE.No rash.No edema. No cough.No anxiety/Depression. All other pertinent systems reviewed and  are negative      Vital Signs   Weight: 117 lb. (53.18 kg.)  (Weight change since last visit: 1 lbs.)    Height: 59 in. (149.86 cm.)  BMI: 23.72    Blood Pressure: 128 /74 mm Hg.  Postion: sitting  Pulse Rate: 73  Comment: right arm    Letha Cape [place]......................January 15, 2017 9:30 AM        Physical Exam:   General: General: appears well,NAD  Vitals reviwed-stable  HEENT: No thyromegaly, EOMI  CVS: no peripheral edema  Resp:norm resp effort  Extremities: No lower extremity edema  Skin: intact, no rash  MS: ambulates well  Neuro: no tremor  Psych: Normal mood and affect.          Laboratory Results:   TSH: 0.80  (05/25/2010 10:17:00 AM)  Vit D 25 30.6 (01/11/2016 8:46:00 AM)  ESR 10 (07/17/2012 10:24:00 AM)  Glucose: 118 (01/11/2016 8:46:00 AM)  Last eye exam: [facility]  Total Testosterone: [facility]  Sodium: 142 (01/11/2016 8:46:00 AM)  Potassium: 4.7 (01/11/2016 8:46:00 AM)  Chloride: 100 (01/11/2016 8:46:00 AM)  Bicarbonate 25 (01/11/2016 8:46:00 AM)  BUN: 14 (01/11/2016 8:46:00 AM)  Creatinine: 0.7 (01/11/2016 8:46:00 AM)  Serum Calcium: 9.7 (01/11/2016 8:46:00 AM)  Serum Magnesium: 2.1 (11/11/2013 10:47:00 AM)  Cholesterol: 225 (11/17/2014 7:13:00 AM)  LDL: 115 (11/17/2014 7:13:00 AM)  HDL: 87 (11/17/2014 7:13:00 AM)  Triglyceride: 113 (11/17/2014 7:13:00 AM)    Assessment & Plan:  62 F with a history of GERD referred in endocrine consultation for osteoporosis at the request of PCP.   She was on alendronate from 2009 to 03-2013.  Had been on risedronate before that. 09-2014 BMD with T scores -2.9 in AP spine and -1.7 in left femur.  She has no history of fracture.  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   She was on alendronate for 6 years.  Her spinal BMD has declined since 2014 but probably not significant-it is still higher that it was compared to 2011. Hip BMD essentially stable and due now.  We will check urine NTX to assess again bone turnover and, unless elevated, likely will continue drug holiday.     In the meantime, discussed goal of 1200 mg daily calcium via diet or pill.  A handout was provided (see instructions).   Gets vitamin D via citracal-D and MVI. Recent kidney function, calcium and vitamin D normal.  Will repeat in one year.  Follow up in one year  Copy to referring provider.    Thank you for involving me in the care of your patient.    Kind regards,    Rosina Lowenstein, MD    Copy of note sent to referring provider  Note generated with dictation software and may contain errors        Patient's Instructions:    Disposition: RTC 62yr w/labs, labs for this visit

## 2017-01-18 ENCOUNTER — Ambulatory Visit: Admitting: Internal Medicine

## 2017-01-18 ENCOUNTER — Ambulatory Visit

## 2017-01-22 LAB — HX NTX-TELOPEPTIDE, 24HR URINE
HX NTX CREAT: 48 mg/dL
HX NTX RESULTS: 111 nmol/L
HX NTX-TELOPEPTIDE: 26

## 2017-01-23 ENCOUNTER — Ambulatory Visit

## 2017-01-23 NOTE — Telephone Encounter (Signed)
Phone Note -       Initial call taken by: Rosina Lowenstein MD,  January 23, 2017 11:00 AM  Initial Details of Call:  please let patient know  labs look good  contnue drug holiday      Follow-up #1  Action: Left Message for Patient  By: Letha Cape [place] ~ January 23, 2017 2:45 PM    Action: Left Message for Patient  By: Letha Cape [place] ~ January 24, 2017 7:58 AM    Follow-up #2  Details: spoke with pt  By: Letha Cape [place] ~ January 24, 2017 8:00 AM

## 2017-02-12 ENCOUNTER — Ambulatory Visit

## 2017-02-15 ENCOUNTER — Ambulatory Visit

## 2017-02-15 NOTE — Telephone Encounter (Signed)
----   Converted from flag ----  ---- 02/14/2017 8:16 PM, [name] MD wrote:  do you mind to  address the results of the bone density of 02/12/2017 with Byrd Hesselbach  slight deterioration   thanks, paula  ------------------------------  Phone Note -       Initial call taken by: Rosina Lowenstein MD,  February 15, 2017 9:39 AM  Initial Details of Call:  please let patient know  BMD results show worsening   prorbably advisable to start Rx  should come in to discuss diff options      Follow-up #1  Details: Appt schedule 02/22/17.  Action: Patient Notified  By: Milinda Hirschfeld [place] ~ February 15, 2017 10:20 AM

## 2017-02-22 ENCOUNTER — Ambulatory Visit

## 2017-02-22 ENCOUNTER — Ambulatory Visit: Admitting: Internal Medicine

## 2017-02-22 NOTE — Progress Notes (Signed)
ENDOCRINOLOGY ENCOUNTER    PCP: [name] MD  Chief Complaint: Osteoporosis  HPI: [name]    62 F with a history of GERD referred in endocrine consultation for osteoporosis at the request of PCP.   She was on alendronate from 2009 to 03-2013.  Had been on risedronate before that. 09-2014 BMD with T scores -2.9 in AP spine and -1.7 in left femur.      02-2017: BMD revwied and worsening scores, spine -3.3, Hip -2.3, heer to discuss Rx options    01-2017:BMD is due and scheduled for 02-2017.She has no history of fracture.She is on a Citracal with vitamin D twice daily.currently on drug holiday  The patient reports puberty occurred at age 62, similar to peers. Periods were regular.  She is G5P4.  Menopause occurred at age 62  Was briefly on HRT at that time.    At around age 62 she was diagnosed with osteoporosis.  Her dairy intake: occasional yogurt, has almond milk every night.     She reports no extended period of prednisone use. She has no history of kidney stones.  She reports no constipation.   No polyuria.    Problem List:   VACCINE AGAINST DISEASE (ICD-V05.9) ([id number])  MOOD DISORDER (ICD-296.90) (ICD10-F39)  BLURRED VISION (ICD-368.8) (ICD10-H53.8)  PREVENTIVE HEALTH CARE (ICD-V70.0) (ICD10-Z00.00)  ADENOMATOUS COLONIC POLYP (ICD-211.3) (ICD10-D12.6)  OSTEOARTHRITIS (ICD-715.90) (ICD10-M19.90)  OSTEOPOROSIS (ICD-733.00) (ICD10-M81.0)  ALLERGY (ICD-995.3) (ICD10-T78.40)  FAMILY HISTORY OF COLON CANCER (ICD-V16.0) (ICD10-Z80.0)      Medication List:   CITRACAL/VITAMIN D TABLET (CALCIUM CITRATE-VITAMIN D TABS) one tab daily  MULTIVITAMINS TABS (MULTIPLE VITAMIN) once daily  CITALOPRAM HYDROBROMIDE 10 MG ORAL TABLET (CITALOPRAM HYDROBROMIDE) 1 1/2 tabs a day; Route: ORAL  OMEPRAZOLE 20 MG ORAL CAPSULE DELAYED RELEASE (OMEPRAZOLE) 1 by mouth daily a needed  IBUPROFEN 600 MG ORAL TABLET (IBUPROFEN) 1 by mouth every six hours as needed for pain.  Take with food; Route: ORAL  FAMOTIDINE 20 MG ORAL TABLET  (FAMOTIDINE) 1 tab a day; Route: ORAL  RECLAST 5 MG/100ML INTRAVENOUS SOLUTION (ZOLEDRONIC ACID) over 30 min; Route: INTRAVENOUS      Allergies:    PCN, SULFA, * TETANUS, * FLU VACCINATION, * SEASONAL.      Histories:   Past Medical History: No hx of mental illness    Past Surgical History: none  Social History: Born in [place], emigrated at age 62  Marital Status:  married--her husb at [employer]  Children:  4  Lives With:  husb, grandson, 2 youngest children.  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 No cold intolerance.  No diarrhea or constipation. No change in hair or skin. No dysphagia, no odynophagia, no solid food or  pill dysphagia. No diplopia/blurry vision.No fevers/chills.No change in voice.No anterior neck pressure. No increase in neck size.No wheeze or DOE.No rash.No edema. No cough.No anxiety/Depression. All other pertinent systems reviewed and are negative      Vital Signs   Weight: 121 lb. (55.00 kg.)  (Weight change since last visit: 4 lbs.)    Height: 57 in. (144.78 cm.)  BMI: 26.28    Blood Pressure: 128 /74 mm Hg.  Postion: sitting  Pulse Rate: 67  Comment: right arm    [name].....................Marland KitchenFebruary 14, 2019 10:43 AM        Physical Exam:   General: General: appears well,NAD  Vitals reviwed-stable  HEENT: No thyromegaly, EOMI  CVS: no peripheral edema  Resp:norm resp  effort  Extremities: No lower extremity edema  Skin: intact, no rash  MS: ambulates well  Neuro: no tremor  Psych: Normal mood and affect.          Laboratory Results:   TSH: 0.80 (05/25/2010 10:17:00 AM)  TSH Ultra: 0.85 (01/15/2017 10:07:00 AM)  Vit D 25 31 NGM/ML (01/15/2017 10:07:00 AM)  ESR 10 (07/17/2012 10:24:00 AM)  Total Prot: 6.7 (11/17/2014 7:13:00 AM)  Albumin: 4.2 (11/17/2014 7:13:00 AM)  AST: 20 (11/17/2014 7:13:00 AM)  Alt: 17 (11/17/2014 7:13:00 AM)  Alkaline Phosphatase: 59 (11/17/2014 7:13:00 AM)  Hep C ab: Non-reactive (02/22/2012 9:53:00 AM)  CPK: 145 (07/15/2012 12:01:00 PM)  Sodium: 140 (01/15/2017 10:07:00 AM)  Potassium: 4.8 (01/15/2017 10:07:00 AM)  Chloride: 102 (01/15/2017 10:07:00 AM)  Bicarbonate 27 (01/15/2017 10:07:00 AM)  BUN: 13 (01/15/2017 10:07:00 AM)  Creatinine: 0.6 (01/15/2017 10:07:00 AM)  Serum Calcium: 9.7 (01/15/2017 10:07:00 AM)  Serum Magnesium: 2.1 (11/11/2013 10:47:00 AM)  Cholesterol: 225 (11/17/2014 7:13:00 AM)  LDL: 115 (11/17/2014 7:13:00 AM)  HDL: 87 (11/17/2014 7:13:00 AM)  Triglyceride: 113 (11/17/2014 7:13:00 AM)    Assessment & Plan:  62 F with a history of GERD referred in endocrine consultation for osteoporosis at the request of PCP.   She was on alendronate from 2009 to 03-2013.  Had been on risedronate before that. 09-2014 BMD with T scores -2.9 in AP spine and -1.7 in left femur.02-2017 spine -3.3, Hip -2.3  She has no history of fracture.  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   She was on alendronate for 6 years.  Her spinal BMD has declined since 2014 but probably not significant-it is still higher that it was compared to 2011 as bisphos worked. Hip BMD now worst.  We discussed options of both Reclast versus Prolia.  She would like to proceed with Reclast.  Reclast side effects discussed, she understands she needs to drink enough water on the day of infusion and the day after, she  will do BMP prior.  In the meantime, discussed goal of 1200 mg daily calcium via diet or pill. Gets vitamin D via citracal-D and MVI. Recent kidney function, calcium and vitamin D   Follow up in one year  Copy to referring provider.    Thank you for involving me in the care of your patient.    Kind regards,    Rosina Lowenstein, MD    Copy of note sent to referring provider  Note generated with dictation software and may contain errors        Patient's Instructions:   Disposition: set up reclast, BMP 1 week prior,RTc 1 yr       Medications:  RECLAST 5 MG/100ML INTRAVENOUS SOLUTION (ZOLEDRONIC ACID)  over 30 min  #1 x 0   Route:INTRAVENOUS   Entered and Authorized by: Rosina Lowenstein MD   Signed by: Rosina Lowenstein MD on 02/22/2017   Method used: Printed then faxed to ...     [id number] (retail)     [address] [place], [place]  [id number]     Ph: [phone]     Fax: [phone]   Note to Pharmacy: Route: INTRAVENOUS;    RxID: [id number]

## 2017-04-23 ENCOUNTER — Ambulatory Visit

## 2017-05-14 ENCOUNTER — Ambulatory Visit

## 2017-05-14 NOTE — Telephone Encounter (Signed)
Phone Note -       Initial call taken by: Rosina Lowenstein MD,  May 14, 2017 7:39 AM  Initial Details of Call:  what is the status of her recalst? requested tos et up in feb 2019 per my note      Follow-up #1  Details: will call bc. never got a response. Faxed twice  By: Letha Cape [place] ~ May 14, 2017 8:14 AM

## 2017-05-16 ENCOUNTER — Ambulatory Visit

## 2017-05-17 ENCOUNTER — Ambulatory Visit

## 2017-05-17 NOTE — Progress Notes (Signed)
Primary Provider:  [name] MD      History of Present Illness:  [name] is a 62 Year Old Female who presents today for:     Follow-Up Visit  Specialists seen since last visit?    Dr Donneta Romberg Thakkar/Endocrine  Has previsit planning and a huddle been performed on this patient?     yes  ..................................................................Marland KitchenDorthey Sawyer RN  May 17, 2017 9:20 AM     This note is generated with a voice-activated system, transcription errors can occur.    [name] is here today for follow-up.  Had been taking citalopram stopped taking in December.  Gastroesophageal reflux on Prilosec and Pepcid--but does not take meds daily, now staying wi pepcid.  Osteoporosis--was to get Reclast--saw Dr. Abigail Miyamoto 02/22/17--she states she was never contacted by the infusion center.  She now wants to delay  In 2016 bone density spine -2.9  In 2019 spine -3.3/hip -2.3  Family history of colon cancer is due for colonoscopy in August 2019 will need new gastroenterologist    complains of pain in the right ear, no stuffiness. no complaints of allergies  no air travel.  going to [place] for 3 weeks, 1st trip since 1971, going to [place].      Current Problems  EAR PAIN, RIGHT  VACCINE AGAINST DISEASE  MOOD DISORDER  BLURRED VISION  PREVENTIVE HEALTH CARE  ADENOMATOUS COLONIC POLYP  OSTEOARTHRITIS  OSTEOPOROSIS  ALLERGY  FAMILY HISTORY OF COLON CANCER    Current Medications- Reviewed during today's visit  CITRACAL/VITAMIN D TABLET (CALCIUM CITRATE-VITAMIN D TABS): one tab daily  MULTIVITAMINS TABS (MULTIPLE VITAMIN): once daily  OMEPRAZOLE 20 MG ORAL CAPSULE DELAYED RELEASE: 1 by mouth daily a needed  IBUPROFEN 600 MG ORAL TABLET: 1 by mouth every six hours as needed for pain.  Take with food  FAMOTIDINE 20 MG ORAL TABLET: 1 tab a day  RECLAST 5 MG/100ML INTRAVENOUS SOLUTION (ZOLEDRONIC ACID): over 30 min    Current Allergies- Reviewed during today's visit  PCN (Critical)  SULFA (Critical)  * TETANUS  (Critical)  * FLU VACCINATION (Critical)  * SEASONAL (Mild)    Past Medical History  No hx of mental illness    Surgical History  none  Family History  FH of Colon Cancer: FATHER d 72  fa estranged from her mother at a young age  Mo alive 74 arthritis in [place]  bro d lymphoma  - no breast or ovarian cancer    No FH of mental illness/substance abuse     Social History  Born in [place], emigrated at age 77.  Marital Status:  married--her husb at [employer]  Children:  4  Lives With:  husb, grandson, 2 youngest children.  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, community in [place]   one child with drug problem: h/o incarcerated for flunking drug tests and not coming for prob officer appt  now in jail for 2 yrs  65 yo grandson  Occupation: shift leader at [employer]  during day takes care of her grandchild  72 yo (daughters child) 4 d  Personnel officer of maintenance at [employer]        Risk Factors  Tobacco User: no  Smoking Status: never smoked  Passive smoke exposure: No    Drug use: no  Alcohol use: no  HIV high risk behavior: no    Exercise: No  Times per week: no  Exercise Comments:  "when I can"  Caffeine (drinks/day): 1  Sun exposure: rarely  Seatbelt use (%): 100            Vital Signs     Patient: 62 Years Old Female  Height:  57 in.  Weight: 121.80 lbs      Wt Chg: 0.80 since 02/22/2017  BMI:  26.45        26.28 on 02/22/2017  BP:  122/72 right arm, normal cuff, seated     128/74 on 02/22/2017   Temp:  97.81  F    oral  Pulse:  68       regular  Resp:  16  Pulse Ox: 98 %    Patient is not experiencing pain    Comments: Contact list- yes  Health care proxy-  No over the counter medications. Takes OTC Vitamins  Med Compliance and SE's: Pt is compliant with meds with no side effects   Pharmacy-[facility]    Medications and Allergies Reviewed    Signed: Dorthey Sawyer RN....May 17, 2017 9:23 AM    Barriers to Learning:  Visual  Comments: wears eye glasses    PHQ 2    Over the last 2 weeks, how often have you been  bothered by any of the following problems?  1. Little interest or pleasure in doing things:  0   - Not at all  2. Feeling down, depressed, or hopeless:  0   - Not at all    Mini-HRA   1. Has your physical and emotional health limited your social activities with family, friends, neighbors, or groups?  No  2. Have you needed help with shopping, transportation, or finances?  No  3. Do you need help completing every day social tasks?  No        Vaccines Ordered: Zoster  Ordering Provider:  Dr Parke Poisson    Vaccines Given  Zoster     Dose:  0.5 mL  Given By:  Dorthey Sawyer RN  Manufacturer:  GlaxoSmithKline     Lot Number:  [id number]     Expiration Date:  07/18/2019  Vaccine Type:  Shingrix [CVX187]  NDC Number:  [id number]  VIS Version Provided, Date:  02/21/2016     Source:  Private Purchase  Route:  IM     Site:  Left Arm (upper)    Patient was observed for 20 mins after vacc admin        Physical Exam    General:      well developed, well nourished, in no acute distress.    Ears:      TMs negative bilaterally  Lungs:      clear bilaterally to auscultation.    Heart:      non-displaced PMI, chest non-tender; regular rate and rhythm, S1, S2 without murmurs, rubs, or gallops  Abdomen:      no focal tenderness soft belly  Psych:      Pleasant appropriate relaxed         Assessment and Plan:     nnext visit with me scheduled 12/27/17  Shingles #2 given today  Mammogram: MGSCRTOMO on 04/23/2017    on .  Pap: SOUT on 12/21/2016.  Bone Density: Complete on 02/12/2017.  Colonoscopy: Complete on 08/14/2012.  Above represents pre-visit preparations for this patient.  1. MOOD DISORDER (F39)  Overall doing well.  On no medications.  Excited about upcoming trip    2. 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  Polette please check your insurance plan to see what you are responsible for    3. OSTEOPOROSIS (M81.0)  should have gotten Reclast fell through the cracks.  Now she wants to hold off until 2020    4. EAR PAIN,  RIGHT (H92.01)  no clear abnormality on exam today.  May benefit from aallergy medications like Allegra or Claritin by mouth  Before you fly [place] you could try taking Afrin nasal spray--this will help the ears when you are in flight        Med Compliance and SE's: Pt is compliant with meds with no side effects     Medications Removed Today:   CITALOPRAM HYDROBROMIDE 10 MG ORAL TABLET (CITALOPRAM HYDROBROMIDE) 1 1/2 tabs a day; Route: ORAL  Orders:   Added new Service order of Patient Encounter ([id number]) - Signed  Added new Service order of Shingrix Vaccine ([id number]) - Signed  Added new Service order of Est Patient Visit - Level 3 (CLI1^EST3) - Signed  Added new Service order of Pulse Ox. (CLI1^PO) - Signed  Added new Service order of Immunization Admin. Single (CLI1^IMS) - Signed  Added new Service order of Est Patient Visit - Level III ([id number]) - Signed    Patient Instructions    nnext visit with me scheduled 12/27/17  Shingles #2 given today  Mammogram: MGSCRTOMO on 04/23/2017    on .  Pap: SOUT on 12/21/2016.  Bone Density: Complete on 02/12/2017.  Colonoscopy: Complete on 08/14/2012.  Above represents pre-visit preparations for this patient.  1. MOOD DISORDER (F39)  Overall doing well.  On no medications.  Excited about upcoming trip    2. 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  Yuritza please check your insurance plan to see what you are responsible for    3. OSTEOPOROSIS (M81.0)  should have gotten Reclast fell through the cracks.  Now she wants to hold off until 2020    4. EAR PAIN, RIGHT (H92.01)  no clear abnormality on exam today.  May benefit from aallergy medications like Allegra or Claritin by mouth  Before you fly [place] you could try taking Afrin nasal spray--this will help the ears when you are in flight

## 2017-05-17 NOTE — Telephone Encounter (Signed)
Internal Correspondence   Call back at [facility] [phone]  Initial call taken by: Talmadge Coventry,  May 17, 2017 11:22 AM  Summary of Call: Screening Colo    Patient does not mind gender, would like it in the morning, would perfer it on a Friday.    Thank you,  Toriee

## 2017-05-18 ENCOUNTER — Ambulatory Visit

## 2017-05-18 NOTE — Progress Notes (Signed)
[facility] [place]   [address]   [place], [place] [address]  Office: [phone] Fax: [phone]  May 18, 2017        Talin Rozeboom  [address]  [place], [place]  [address]    Dear Ms. Edward JollyZigmund Gottron is important information regarding your scheduled procedure.  DR. Chestine Spore has reviewed all of your medical information available and has reserved a time for your specified appointment.  Please note that this appointment requires a time slot at the endoscopy suite, booking of nursing staff, and (depending on the type of procedure) an anesthesiologist.  Cancelling your appointment on short notice, or not showing up for the appointment, leads to unnecessary downtime and cost and results in decreased access to timely care for our patients.  Try to avoid the cancellation of your appointment, if at all possible, by making the necessary arrangements needed in order to keep your appointment.  If you have to cancel - Please call at least 5 business days ahead of time, to allow for another patient to be schedule in that time slot.  If you cancel on shorter notice, or do not show for your appointment, you may be billed a nominal administrative fee of $150.00 to help cover some of the incurred overhead costs.  We appreciate your cooperation and understanding in this matter.  Sincerely,  [facility]

## 2017-05-18 NOTE — Progress Notes (Signed)
[facility] [place]   [address]  [place], [place] [address]  Phone: [phone]  Fax: [phone]     May 18, 2017      Kaisyn Reinhold  [address]  [place], [place] [address]      Dear Ms. Grode:    Your appointment has been made for you. Please see details below.  Please bring a photo ID and your insurance card to your appointment.      Appointment Type: COLONOSCOPY  Facility/Provider  DR. CLARK  Facility/Provider Phone Number: [phone]  Date: 09/21/2017   Day of Week: Friday  Time: 9:00 AM  Arrival Time: 8:00 AM      Appointment Details:   [facility]  [address]  [place], [place] [address]  [phone]  ***Our Insurance Coordination Department will be reaching out to you 3-5 days prior to your procedure to pre-register you for your appointment. During this phone call your demographic and insurance information will be confirmed and/or updated.  At this time they will also make you aware of any financial responsibilities you may have, such as co-payment, co-insurance or deductible.    Payments can be made over the phone at the time of preregistration.  Otherwise, payment is expected at check in on the day of your procedure.   If you have not heard from a member of the Insurance Coordination Department within 48 hours of your procedure please contact them at [phone].  You may also reach out to your insurance company with any questions regarding your financial responsibility.        Patient Instructions for Miralax Colonoscopy Preparation:    PURCHASE THESE OVER THE COUNTER LAXATIVES:  1. GATORADE (96 ounces): lemonade, orange, clear, blue, lemon-lime (purchase three 32 OZ bottles)  2. DULCOLAX 5mg  tablets (two tablets)  3. MIRALAX BOTTLE 238 grams     IF YOU ARE TAKING BLOOD THINNERS (PLAVIX, XARELTO, OR COUMADIN), DIABETES MEDICATIONS (INSULIN OR TABLETS), HEART MEDICATIONS (DIGOXIN), OR IF YOU HAVE A PACEMAKER OR DEFIBRILLATOR (AICD):  PLEASE NOTIFY us WELL IN  ADVANCE OF THE PROCEDURE TO RECEIVE SPECIFIC INSTRUCTIONS.    THE DAY BEFORE YOUR COLONOSCOPY: CLEAR LIQUIDS ONLY. ABSOLUTLEY NO SOLID FOOD    Drink only clear liquids.  Examples of clear liquids:  Water, clear fruit juices such as apple or white grape, chicken or beef bouillon, Jell-O (NO RED, GREEN, OR GRAPE), Gatorade, or similar sports drink (NO RED, GREEN OR PURPLE), popsicles (NO RED, GREEN, OR GRAPE), clear soft drinks (Sprite, ginger ale, 7UP etc.), coffee/tea  without cream.     NO MILK OR MILK PRODUCTS, NO ORANGE JUICE, NO RED, GREEN, OR GRAPE JELLO OR JUICES    3 PM:  Take 2 Dulcolax tablets   5PM-10PM:  Pour the  entire bottle of Miralax in a large pitcher filled with the three bottles of Gatorade. Mix well to dissolve all of the Miralax.  Pour the solution back into the three bottles. Begin drinking the first two bottles of the solution. You may drink it cold or room temperature, whichever you prefer. If you need a break, that's okay. You can stop for 30 minutes and resume.   MIDNIGHT:  ***Very Important-Please read carefully***  After you have completed the two 32 OZ bottles of Gatorade, you must finish the remaining bottle by 3AM. Split dosing preparations are especially important because they help ensure the best clean out over conventional preps. Consuming the second dose later allows for clean out  of residual stool because your body continues to produce stool.**    THE DAY OF YOUR COLONOSCOPY:  FOR AM PROCEDURES:  You may take your morning medications with a sip of water.     FOR AFTERNOON PROCEDURES:  You may drink clear liquids until 6AM.  NO SOLID FOOD.  You may take your morning medications with a sip of water.     **If you typically have caffeine, and are prone to caffeine headaches, you may have a  small cup of coffee or tea  WITHOUT milk or cream in the morning.   You should have this at least four hours before your arrival time.  **    REMEMBER:  The preparation is very important.  An  adequate clean out allows for the best evaluation of your entire colon.  During the prep, using baby wipes may ease some of your discomfort (if any). Applying Vaseline or similar to the anal area can also help with any discomfort.    *If you become ill with cold or flu symptoms, you should call the office immediately and speak to the nurse.*    All patients must make arrangements to be picked up and accompanied home by a friend or relative at time of discharge.  You will not be allowed to go home by yourself, including public transportation, or taxi.  You should NOT plan on working or driving the rest of the day due to the sedation given at the procedure.     You will be able to return home approximately 3 hours from your arrival time.         Sincerely,    Josiah Lobo, M.D.

## 2017-05-28 ENCOUNTER — Ambulatory Visit

## 2017-06-12 ENCOUNTER — Ambulatory Visit

## 2017-06-22 ENCOUNTER — Ambulatory Visit

## 2017-06-22 ENCOUNTER — Ambulatory Visit: Admitting: Emergency Medicine

## 2017-06-22 NOTE — Telephone Encounter (Signed)
Phone Note -       Follow up Call:: Post ER  Initial call taken by: [name] MD,  June 22, 2017 10:57 PM  Initial Details of Call:  follow up call  allergic rhinitis in urg care      Follow-up #1  Details: Spoke to [facility], she is cont to take her OTC allergy medication and motrin, she has f/u with dr Parke Poisson 6/27 she thanked me for checking in  By: Esau Grew ~ June 25, 2017 9:21 AM

## 2017-07-05 ENCOUNTER — Ambulatory Visit

## 2017-07-05 NOTE — Progress Notes (Signed)
Primary Provider:  [name] MD      History of Present Illness:  [name] is a 61 year old Female who presents today for: Follow up  Specialists seen since last visit?  Is there an updated release of information to speak for the patient on file (valid for 1 year only)?   Has previsit planning and a huddle been performed on this patient? yes  ..................................................................Marland KitchenEsau Grew  July 05, 2017 10:33 AM     This note is generated with a voice-activated system, transcription errors can occur.    she is here in follow-up.  In 06/22/17 seen in urgent care for right ear pressure and diffuse headaches.  AdvisedZyrtec and Flonase. motrin alternate wi tylenol  Had seen ENT prior--Dr. bowling I did not receive that note.  She is so much better today.  She has no headache.  She has no ear pain.  Overall feeling back to baseline      Current Problems  ELEVATED BLOOD PRESSURE READING WITHOUT DIAGNOSIS OF HYPERTENSION (ICD10-R03.0)  EAR PAIN, RIGHT (ICD10-H92.01)  MOOD DISORDER (ICD10-F39)  BLURRED VISION (ICD10-H53.8)  PREVENTIVE HEALTH CARE (ICD10-Z00.00)  ADENOMATOUS COLONIC POLYP (ICD10-D12.6)  OSTEOARTHRITIS (ICD10-M19.90)  OSTEOPOROSIS (ICD10-M81.0)  ALLERGY (ICD10-T78.40)  FAMILY HISTORY OF COLON CANCER (ICD10-Z80.0)    Current Medications  CITRACAL/VITAMIN D TABLET (CALCIUM CITRATE-VITAMIN D TABS): one tab daily  MULTIVITAMINS TABS (MULTIPLE VITAMIN): once daily  OMEPRAZOLE 20 MG ORAL CAPSULE DELAYED RELEASE: 1 by mouth daily a needed  IBUPROFEN 600 MG ORAL TABLET: 1 by mouth every six hours as needed for pain.  Take with food  FAMOTIDINE 20 MG ORAL TABLET: 1 tab a day  RECLAST 5 MG/100ML INTRAVENOUS SOLUTION (ZOLEDRONIC ACID): over 30 min    Past Medical History  No hx of mental illness    Surgical History  none  Family History  FH of Colon Cancer: FATHER d 68  fa estranged from her mother at a young age  Mo alive 2 arthritis in [place]  bro d lymphoma  - no breast or  ovarian cancer    No FH of mental illness/substance abuse     Social History  Born in [place], emigrated at age 41.  Marital Status:  married--her husb at [employer]  Children:  4  Lives With:  husb, grandson, 2 youngest children.  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, community in [place]   one child with drug problem: h/o incarcerated for flunking drug tests and not coming for prob officer appt  now in jail for 2 yrs  67 yo grandson  Occupation: shift leader at [employer]  during day takes care of her grandchild  5 yo (daughters child) 4 d  Personnel officer of maintenance at [employer]      Risk Factors  Tobacco User: no  Smoking Status: never smoked  Passive smoke exposure: No    Drug use: no  Alcohol use: no  HIV high risk behavior: no    Exercise: No  Times per week: no  Exercise Comments:  "when I can"    Caffeine (drinks/day): 1  Sun exposure: rarely  Seatbelt use (%): 100            Vital Signs     Patient: 62 Years Old Female  Height:  57 in.  Weight: 125 lbs      Wt Chg: 3.20 since 05/17/2017  BMI:  27.15        26.45 on 05/17/2017  BP:  140/92 left arm, normal cuff, seated     122/72 on 05/17/2017     146/86  Temp:  97.9  F    oral  Pulse:  74       regular    Patient is not experiencing pain    Signed: Esau Grew....July 05, 2017 10:45 AM          Physical Exam    General:      well developed, well nourished, in no acute distress.    Eyes:      pupils equal extraocular movements intact  Ears:      TMs negative  hearing intact to soft sound--rubbing of the fingers  Nose:      no maxillary sinus tenderness  Lungs:      clear bilaterally to auscultation.    Heart:      non-displaced PMI, chest non-tender; regular rate and rhythm, S1, S2 without murmurs, rubs, or gallops  Abdomen:       normal bowel sounds; no hepatosplenomegaly no ventral,umbilical hernias or masses noted.           Assessment and Plan:     follow-up visit for blood pressure check 6-8 weeks  Mammogram: MGSCRTOMO on 04/23/2017    on  .  Pap: SOUT on 12/21/2016.  Bone Density: Complete on 02/12/2017.  Colonoscopy: Complete on 08/14/2012.  Above represents pre-visit preparations for this patient.  1. EAR PAIN, RIGHT (H92.01)  Her pain has resolved.  Exam negative    2. ALLERGY (T78.40)  suspect most of the symptoms of headache ear discomfort have come from allergy//change in barometric pressures.  when  flying:  Take Afrin nasal spray 2 hours before getting on the plane--do not continue  Get meclizine/Bonine/Dramamine to to take iif you experience any dizziness or vertigo  Keep yourself well hydrated    3. ELEVATED BLOOD PRESSURE READING WITHOUT DIAGNOSIS OF HYPERTENSION (R03.0)  blood pressure borderline high today but husband just had a very serious car accident.  Advise:  Try to really cut back on any caffeinated beverage and chocolate  Watch out for salt-containing foods  Continue to be active  See me for blood pressure check in 6-8 weeks        Med Compliance and SE's: Pt is compliant with meds with no side effects   Orders:   Added new Service order of Patient Encounter ([id number]) - Signed  Added new Service order of Est Patient Visit - Level 3 (CLI1^EST3) - Signed  Added new Service order of Est Patient Visit - Level III ([id number]) - Signed    Patient Instructions    follow-up visit for blood pressure check 6-8 weeks  Mammogram: MGSCRTOMO on 04/23/2017    on .  Pap: SOUT on 12/21/2016.  Bone Density: Complete on 02/12/2017.  Colonoscopy: Complete on 08/14/2012.  Above represents pre-visit preparations for this patient.  1. EAR PAIN, RIGHT (H92.01)  Her pain has resolved.  Exam negative    2. ALLERGY (T78.40)  suspect most of the symptoms of headache ear discomfort have come from allergy//change in barometric pressures.  when  flying:  Take Afrin nasal spray 2 hours before getting on the plane--do not continue  Get meclizine/Bonine/Dramamine to to take iif you experience any dizziness or vertigo  Keep yourself well hydrated    3. ELEVATED  BLOOD PRESSURE READING WITHOUT DIAGNOSIS OF HYPERTENSION (R03.0)  blood pressure borderline high today but husband just had a very serious car accident.  Advise:  Try  to really cut back on any caffeinated beverage and chocolate  Watch out for salt-containing foods  Continue to be active  See me for blood pressure check in 6-8 weeks

## 2017-07-09 ENCOUNTER — Ambulatory Visit

## 2017-09-04 ENCOUNTER — Ambulatory Visit

## 2017-09-07 ENCOUNTER — Ambulatory Visit

## 2017-09-07 NOTE — Telephone Encounter (Signed)
Phone Note -     Outgoing Call    Initial call taken by: Eugenia Mcalpine,  September 07, 2017 2:58 PM  Summary of Call: SWP, confirmed appt date, time/arrival, transportation, prep instructions

## 2017-09-11 ENCOUNTER — Ambulatory Visit

## 2017-09-13 ENCOUNTER — Ambulatory Visit

## 2017-09-13 NOTE — Progress Notes (Signed)
Primary Provider:  [name] MD      History of Present Illness:  [name] is a 62 year old Female who presents today for:  f/u B/P  Specialists seen since last visit?  Is there an updated release of information to speak for the patient on file (valid for 1 year only)?  yes  Has previsit planning and a huddle been performed on this patient? yes  ..................................................................Marland KitchenLysbeth Galas RN  September 13, 2017 9:44 AM     This note is generated with a voice-activated system, transcription errors can occur.    return visit.  Blood pressure very elevated when last seen 07/05/17--coinciding with her husband being in a serious MVA.  Had been seen in urgent care for right ear pressure headache noted at that visit.          Current Problems- Reviewed during today's visit  ELEVATED BLOOD PRESSURE READING WITHOUT DIAGNOSIS OF HYPERTENSION  MOOD DISORDER  BLURRED VISION  PREVENTIVE HEALTH CARE  ADENOMATOUS COLONIC POLYP  OSTEOARTHRITIS  OSTEOPOROSIS  ALLERGY    Current Medications- Reviewed during today's visit  CITRACAL/VITAMIN D TABLET (CALCIUM CITRATE-VITAMIN D TABS): one tab daily  MULTIVITAMINS TABS (MULTIPLE VITAMIN): once daily  OMEPRAZOLE 20 MG ORAL CAPSULE DELAYED RELEASE: 1 by mouth daily a needed  IBUPROFEN 600 MG ORAL TABLET: 1 by mouth every six hours as needed for pain.  Take with food  FAMOTIDINE 20 MG ORAL TABLET: 1 tab a day  RECLAST 5 MG/100ML INTRAVENOUS SOLUTION (ZOLEDRONIC ACID): over 30 min    Current Allergies- Reviewed during today's visit  PCN (Critical)  SULFA (Critical)  * TETANUS (Critical)  * FLU VACCINATION (Critical)  * SEASONAL (Mild)    Past Medical History  No hx of mental illness      Surgical History  none    Family History  FH of Bladder Cancer: FATHER d 40  fa estranged from her mother at a young age  Mo alive 82 arthritis in [place]  bro d lymphoma  - no breast or ovarian cancer    No FH of mental illness/substance abuse       Social History  Born  in [place], emigrated at age 17.  Marital Status:  married--her husb at [employer]  Children:  4  Lives With:  husb, grandson, 2 youngest children.  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, community in [place]   one child with drug problem: h/o incarcerated for flunking drug tests and not coming for prob officer appt  now in jail for 2 yrs  59 yo grandson  Occupation: shift leader at [employer]  during day takes care of her grandchild  24 yo (daughters child) 4 d  Personnel officer of maintenance at [employer]      Risk Factors  Smoking Status: never smoked  Passive smoke exposure: No    Drug use: no  Alcohol use: no  HIV high risk behavior: no    Exercise: No  Times per week: no  Exercise Comments:  "when I can"    Caffeine (drinks/day): 1  Sun exposure: rarely  Seatbelt use (%): 100            Vital Signs     Patient: 62 Years Old Female  Height:  57 in.  Weight: 126 lbs  0 oz       Wt Chg: 1 since 07/05/2017  BMI:  27.36        27.15 on 07/05/2017  BP:  132/78 left arm, normal cuff, seated     140/92 on 07/05/2017     128/70  Temp:  97.9  F    oral  Pulse:  70         Resp:  16  Pulse Ox: 98 %  On Oxygen: No    Patient is not experiencing pain    Comments: Contact Sheet up to date  Health Care Proxy in chart  Med compliance and SE's: Pt is compliant with   meds and no side effects  Pharmacy is correct  Do you take any OTC medications  Med compliance and SE's;patient is compliant with  meds with no side effects  Medication checked, no problems  Flu shot up to date  Pt feels good        Medications and Allergies Reviewed    Signed: Lysbeth Galas RN.Marland KitchenMarland KitchenMarland KitchenSeptember  5, 2019 9:43 AM    PHQ 2    Over the last 2 weeks, how often have you been bothered by any of the following problems?  1. Little interest or pleasure in doing things:  0   - Not at all  2. Feeling down, depressed, or hopeless:  0   - Not at all        Physical Exam    General:      well developed, well nourished, in no acute distress.    Ears:      bilat tm's  neg  Neck:      no carotid bruits  Lungs:      clear bilaterally to auscultation.    Heart:      non-displaced PMI, chest non-tender; regular rate and rhythm, S1, S2 without murmurs, rubs, or gallops  Psych:      Pleasant appropriate in good spirits         Assessment and Plan:     follow up scheduled next appointment 12/27/17  allergic to flu shot.  Allergic to tetanus.  Up-to-date with immunizations.  Mammogram: MGSCRTOMO on 04/23/2017    on .  Pap: SOUT on 12/21/2016.  Bone Density: Complete on 02/12/2017.  Colonoscopy: Complete on 08/14/2012.--Scheduled 09/21/17 istory of adenomatous polyp.  Family history has bladder cancer not colon cancer  Above represents pre-visit preparations for this patient.  1. ELEVATED BLOOD PRESSURE READING WITHOUT DIAGNOSIS OF HYPERTENSION (R03.0)  follow-up blood pressure much better.  Probably stress related at last visit.  On no antihypertensive medications.  Continue    2. ADENOMATOUS COLONIC POLYP (D12.6)  colonoscopy appropriately scheduled next week    3. OSTEOPOROSIS (M81.0)  has follow-up appointment with endocrine in 2020        Orders:   Added new Service order of Patient Encounter ([id number]) - Signed  Added new Service order of Est Patient Visit - Level 3 (CLI1^EST3) - Signed  Added new Service order of Est Patient Visit - Level III ([id number]) - Signed    Patient Instructions    follow up scheduled next appointment 12/27/17    thanks again for the thoughtful gift  allergic to flu shot.  Allergic to tetanus.  Up-to-date with immunizations.  Mammogram: MGSCRTOMO on 04/23/2017    on .  Pap: SOUT on 12/21/2016.  Bone Density: Complete on 02/12/2017.  Colonoscopy: Complete on 08/14/2012.--Scheduled 09/21/17 istory of adenomatous polyp.  Family history has bladder cancer not colon cancer  Above represents pre-visit preparations for this patient.  1. ELEVATED BLOOD PRESSURE READING WITHOUT DIAGNOSIS OF HYPERTENSION (R03.0)  follow-up blood pressure much  better.  Probably stress  related at last visit.  On no antihypertensive medications.  Continue    2. ADENOMATOUS COLONIC POLYP (D12.6)  colonoscopy appropriately scheduled next week    3. OSTEOPOROSIS (M81.0)  has follow-up appointment with endocrine in 2020

## 2017-09-17 ENCOUNTER — Ambulatory Visit

## 2017-09-17 NOTE — Progress Notes (Signed)
Directives:  Removed directive of NEW CONTACT INFORMATION 12/16/15  Changed directive from #1 JOSE SIVA (HUSB) #2SONIA BROGNA (DAUGHTER) to 09/13/17 AUTH TO DISCUSSJOSE SIVA (HUSB) /

## 2017-09-21 ENCOUNTER — Ambulatory Visit: Admitting: Internal Medicine

## 2017-09-21 ENCOUNTER — Ambulatory Visit

## 2017-09-24 ENCOUNTER — Ambulatory Visit: Admitting: Internal Medicine

## 2017-09-24 NOTE — Progress Notes (Signed)
[facility] [place]   [address]   [place], [place] [address]  Office: [phone] Fax: [phone]  September 24, 2017    [name]  [address]   [place], [place]  [address]          Dear [name]:    I just wanted to be sure you received the final results from your recent colonoscopy at [facility]. As you know, polyp tissue was removed from your large intestine at the time of the procedure.  As expected the polyp tissue was benign, i.e., non-cancerous. More specifically the types of polyps removed are referred to as `adenoma` and 'sessile serrated polyp'. These types of polyps, when not removed can grow larger over many years and have the potential to turn into a colon cancer. There is no longer a significant risk for polyps after they are removed; however, given the type and number of polyps removed, we recommend a repeat colonoscopy in 3 years to be sure that you have not developed any new polyps.     I would also recommend colon cancer screening for any first degree relatives (brothers, sisters, children, and parents).  Make your family members aware of these results so they can discuss screening with their respective physicians.     In addition, you could also consider changing any health habits that might increase your risk of forming more polyps and thus, colon cancer.  You may lower your risk of future polyps and colon cancer by adopting healthy habits such as not smoking, being physically active, eating a diet which includes fruits and vegetables and maintaining a healthy weight.        Please feel free to contact me should you have any questions and thank you for allowing me to take care of your health.        Regards,     Josiah Lobo, MD  [phone]        CC: [name]

## 2017-09-25 ENCOUNTER — Ambulatory Visit

## 2017-09-25 NOTE — Progress Notes (Signed)
Risk of Patient: High Risk  Status:  Up-to-date    Last Colonoscopy: Complete (09/21/2017 5:52:00 AM)  Next Due: 09/21/2020    Last FIT: NEGATIVE (12/24/2013 2:47:00 PM)  Last Guaiac: negative (11/23/2010 9:05:13 AM)   Observations:  Added new observation of COLONNXTDUE: 09/21/2020 (09/25/2017 10:20)  Added new observation of COLON CA RSK: High Risk (09/25/2017 10:20)  Added new observation of COLONSTATUS: Up-to-date (09/25/2017 10:20)

## 2017-10-24 ENCOUNTER — Ambulatory Visit: Admitting: Emergency Medicine

## 2017-10-24 ENCOUNTER — Ambulatory Visit

## 2017-10-24 NOTE — Telephone Encounter (Signed)
Phone Note -       Initial call taken by: [name] MD,  October 24, 2017 5:40 PM  Initial Details of Call:  seen for severe headache in urg care then ER  given tramadol 25  needs follow up call  CT B neg      Follow-up #1  Details: Call to patient in follow up  a little better with tramadol. ER advised to follow up with neurology. she is going to make appointment and call referral line to get referral.    Advisesd to call office if her sx change or get worse     By: [name] [place] ~ October 25, 2017 9:23 AM

## 2017-10-24 NOTE — ED Provider Notes (Signed)
ED Note  Rpt # [phone]          [facility]  Department of Emergency Medicine  [address]  [place], [place] [address]  [phone]    Patient          [name],[name]  DOB:             2055/09/01  Pt. Location:    [facility]  MR #:            [id number]             Account #:   [id number]  Service Date:    10/24/17  Primary Care:    Evelina Bucy MD  Report Status:   Signed  -----------------------------------------------------------------------------        ED Report    *  * * *    Report:      Service Date:  10/24/17      Documenting Provider:      Rowland Lathe MD      CHIEF COMPLAINT: Headache    HISTORY OF PRESENT ILLNESS: Patient complains of a right-sided headache ongoing  for a number of weeks now. She states there feels like a lot of pressure" behind  her right ear that extends down to her right neck and up the back of her her  right head. She denies any trauma. She states the headache as a constant dull  throbbing sensation for which she takes NSAIDs over-the-counter and gets a  little bit relief but never total relief. She states it was gradual in onset.  Denies any fever or chills, blurry or double vision, nasal congestion, ear pain,  neck pain, chest pain, shortness of breath or difficulty breathing, trouble  speaking, trouble using her arms or legs or other complaints.    ROS: All other systems reviewed and negative except as noted in the HPI.    PMHx/PSHx: Osteoporosis, anxiety,? Reflux    SHx/FHx: Nonsmoker.    VITAL SIGNS: 152/69, 71, 60, 97.8, 100% on room air  PHYSICAL EXAM:  CONSTITUTIONAL/GENERAL APPEARANCE: No apparent discomfort.  NEUROLOGICAL: Alert and oriented  3. Sensory and motor functions grossly intact.  Cranial Nerve Exam - II,III pupils react equally; IV, VI - EOMI; V - intact  sensation face bilateral; VII - intact movement face bilateral; VIII- hearing  intact bilateral; IX, X, XII - tongue and palate motion equal and intact; XI -  shoulder shrug equal and intact.  Motor and sensory function equal and symmetric.  HEAD: Normal cephalic.  Atraumatic. No palpable tenderness to the head or face  other than as indicated below around the right mastoid area  FACE:  No tenderness, swelling, or signs of trauma.  EYES: No injection.  No discharge.  Pupils reactive at 5 mm bilaterally.  EARS: External ear appears to be normal. When I palpate the right mastoid area,  the patient indicates that the "exact location of the pain." Inspection of the  mastoid surface reveals no erythema or soft tissue swelling.  NOSE: No injection, exudate or septal hematoma.   No nasal congestion.  MOUTH: Mucous membranes moist.  No infection to the gums.  THROAT: Normal speech. Airway is patent.  Oropharynx clear, no signs of  erythema.  Uvula midline.  No trismus or drooling.  NECK: Range of motion grossly normal.  Supple.  Nontender.  No lymphadenopathy.  Supple. No palpable anterior or posterior asymmetry.  CARDIOVASCULAR: No cyanosis. Regular rate and rhythm.  RESPIRATORY/CHEST: No distress. Clear to auscultation bilaterally.  ABDOMEN:  Nondistended.  MUSCULOSKELETAL, EXTREMITY AND/OR BACK: Range of motion grossly normal. No pain  with range of motion.  SKIN: No acute lesions or rash, normal skin turgor.  HEME/LYMPH: No petechiae.    COURSE IN THE ED: The location of the patient's headache appears to be at the  origin of the right mastoid area however there is no obvious clinical exam to  support acute mastoiditis. The rest of her head, face as well as neck exam is  unremarkable. I did order a head CT which will evaluate the sinuses including  mastoid. Head CT unremarkable for any acute findings.Work up is unremarkable.  Patient understands that no exact cause was discovered to explain the reason for  the symptoms however they are no urgent or emergent findings. I do not feel  there is any need for further ED investigation, ED consultation or hospital  admission. I do feel the patient is safe for  discharge.  I have explained to the  patient to be vigilant for any further/additional signs or symptoms that may  develop that may help to explain the cause of the original symptoms. Patient  agrees to this. Patient told to seek medical attention immediately for any  further concerns, worsening of condition, or not getting better in the expected  time thought to. Patient informed of evaluation results.  I have answered all  questions.  Patient told to inform primary care doctor today of this ER visit  and to obtain follow-up visit as soon as possible or return to the ER for a  check up if unable to see primary care doctor and still concerned.  Patient has  been discharged. I did prescribe the patient tramadol 25 mg p.o. t.i.d. p.r.n.  20 tablets no refills. I have asked the patient to follow-up with her primary  care doctor for possible referral to neurology such as Salvatore Marvel who  specializes in headaches. She agrees to do so.    DIAGNOSITIC IMPRESSION: Acute headache    CONDITION: Stable.    PROCEDURES: None.                ELECTRONICALLY SIGNED  Rodrigo Ran MD  10/24/17   1118        ATTENTION: Dictation performed using a voice recognition program. Please excuse  spelling and/or grammar errors. Please bring to the author's attention any  material  errors that need to be corrected especially those that may change the meaning of  the sentence.  The physician's electronic signature is applicable to the entire Emergency  Department record for the above date of service/date of discharge. This  signature  indicates that the professional services portion of the Emergency Department  record, including all orders, has been reviewed by the above signed provider  and serves as a quality review of a physician extender's chart, if applicable.  Please see Emergency Department Medical Record for additional patient  information;  this may include discharge diagnosis, interpretation of EKG, laboratory,  and/or  radiological studies, Emergency Department course, etc.  This document was reproduced from its electronic form and should not, under any  circumstances, be considered a complete and final reproduction of the legal  medical  record unless signed by the responsible author.

## 2017-12-27 ENCOUNTER — Ambulatory Visit

## 2017-12-27 LAB — HX COMPREHENSIVE METABOLIC PANEL
HX ALBUMIN: 4.2 g/dL (ref 3.2–5.0)
HX ALKALINE PHOSPHATASE: 72 U/L (ref 30.0–117.0)
HX ALT: 47 U/L (ref 6.0–55.0)
HX ANION GAP: 5 (ref 3.0–11.0)
HX AST: 27 U/L (ref 6.0–40.0)
HX BICARBONATE: 31 mmol/L (ref 21.0–32.0)
HX BUN: 13 mg/dL (ref 8.0–23.0)
HX CALCIUM: 9.9 mg/dL (ref 8.5–10.5)
HX CHLORIDE: 103 mmol/L (ref 98.0–110.0)
HX CREATININE: 0.65 mg/dL (ref 0.55–1.3)
HX GLOMERULAR FR AFRICAN AMERICAN: >90
HX GLOMERULAR FR NON AFRICAN AMER: >90
HX GLUCOSE: 106 mg/dL (ref 70.0–110.0)
HX POTASSIUM: 4.3 mmol/L (ref 3.6–5.2)
HX SODIUM: 139 mmol/L (ref 136.0–146.0)
HX TOTAL BILIRUBIN: 0.5 mg/dL (ref 0.2–1.2)
HX TOTAL PROTEIN: 7.6 g/dL (ref 6.0–8.4)

## 2017-12-27 LAB — HX CBC W/DIFF
HX ABSOLUTE BASO COUNT AUTODIFF: 0.03 10*3/uL (ref 0.0–0.22)
HX ABSOLUTE EOS COUNT AUTODIFF: 0.06 10*3/uL (ref 0.0–0.45)
HX ABSOLUTE LYMPHS COUNT AUTODIFF: 1.91 10*3/uL (ref 0.74–5.04)
HX ABSOLUTE MONO COUNT AUTODIFF: 0.3 10*3/uL (ref 0.0–1.34)
HX ABSOLUTE NEUTRO COUNT AUTODIFF: 2.95 10*3/uL (ref 1.48–7.95)
HX BASOPHIL AUTOMATED: 0.6 %
HX EOSINOPHIL AUTOMATED: 1.1 %
HX HEMATOCRIT: 46.4 % (ref 36.0–47.0)
HX HEMOGLOBIN: 14.8 g/dL (ref 11.8–16.0)
HX IG AUTOMATED: 0.2 % (ref 0.0–2.0)
HX LYMPHOCYTE AUTOMATED: 36.3 %
HX MEAN CORP.HEMO.CONC.: 31.9 g/dL (ref 31.0–37.0)
HX MEAN CORPUSCULAR HEMOGLOBIN: 29 pg (ref 26.0–34.0)
HX MEAN CORPUSCULAR VOLUME: 91 fL (ref 80.0–100.0)
HX MEAN PLATELET VOLUME: 11.6 fL (ref 9.4–12.4)
HX MONOCYTE AUTOMATED: 5.7 %
HX NEUTROPHIL AUTOMATED: 56.1 %
HX NUCLEATED RBC %: 0 % (ref 0.0–0.0)
HX PLATELET COUNT: 211 10*3/uL (ref 150.0–400.0)
HX RED BLOOD COUNT: 5.1 10*6/uL (ref 3.9–5.2)
HX RED CELL DISTRIBUTION WIDTH SD: 43.7 fL (ref 35.0–51.0)
HX WHITE BLOOD COUNT: 5.3 10*3/uL (ref 3.7–11.2)

## 2017-12-27 LAB — HX LIPID PANEL FASTING
HX CHOLESTEROL (LIPR): 252 mg/dL — ABNORMAL HIGH (ref <=200)
HX HDL CHOLESTEROL: 100 mg/dL (ref >=40)
HX LDL CHOLESTEROL: 137 mg/dL — ABNORMAL HIGH (ref <=130)
HX TRIGLYCERIDES: 73 mg/dL (ref <=150)

## 2017-12-27 LAB — HX LIPASE: HX LIPASE: 160 U/L (ref 73.0–393.0)

## 2017-12-27 LAB — HX AMYLASE: HX AMYLASE: 66 U/L (ref 23.0–115.0)

## 2017-12-27 NOTE — Progress Notes (Signed)
Primary Provider:  [name] MD      History of Present Illness:  [name] is a 62 year old Female who presents today for: Follow up   Specialists seen since last visit?  Is there an updated release of information to speak for the patient on file (valid for 1 year only)?   Has previsit planning and a huddle been performed on this patient? yes  ..................................................................Marland KitchenEsau Grew  December 27, 2017 9:56 AM    pt c/uo increased Heartburn, and RLQ pain- dull ache  Influenza vaccine was declined. Pt is allergic    here for comprehensive visit  osteoporosis--getting reclast with Dr Tedra Sen[place]  Headache treated wi tramadol in 10/2017 in ER, CT Brain neg  Borderline BP  GERD on ome[raxole  hx colon polyps last colo on 09/2017 ssp adenoma, repeat 3 yrs        Current Problems- Reviewed during today's visit  DYSPEPSIA (ICD10-K30)  ABDOMINAL PAIN (ICD10-R10.9)  ELEVATED BLOOD PRESSURE READING WITHOUT DIAGNOSIS OF HYPERTENSION (ICD10-R03.0)  MOOD DISORDER (ICD10-F39)  BLURRED VISION (ICD10-H53.8)  PREVENTIVE HEALTH CARE (ICD10-Z00.00)  ADENOMATOUS COLONIC POLYP (ICD10-D12.6)  OSTEOARTHRITIS (ICD10-M19.90)  OSTEOPOROSIS (ICD10-M81.0)  ALLERGY (ICD10-T78.40)    Current Medications- Reviewed during today's visit  CITRACAL/VITAMIN D TABLET (CALCIUM CITRATE-VITAMIN D TABS): one tab daily  MULTIVITAMINS TABS (MULTIPLE VITAMIN): once daily  OMEPRAZOLE 20 MG ORAL CAPSULE DELAYED RELEASE: 1 by mouth daily a needed  IBUPROFEN 600 MG ORAL TABLET: 1 by mouth every six hours as needed for pain.  Take with food  FAMOTIDINE 20 MG ORAL TABLET: 1 tab a day  RECLAST 5 MG/100ML INTRAVENOUS SOLUTION (ZOLEDRONIC ACID): over 30 min    Current Allergies- Reviewed during today's visit  PCN (Critical)  SULFA (Critical)  * TETANUS (Critical)  * FLU VACCINATION (Critical)  * SEASONAL (Mild)    Past Medical History  No hx of mental illness      Surgical History  none    Family History  FH of Bladder  Cancer: FATHER d 75  fa estranged from her mother at a young age  Mo alive 67 arthritis in [place]  bro d lymphoma  - no breast or ovarian cancer    No FH of mental illness/substance abuse       Social History  Born in [place], emigrated at age 4.  Marital Status:  married--her husb at [employer]  Children:  4  Lives With:  husb, grandson, 2 youngest children.  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, community in [place]   one child with drug problem: h/o incarcerated for flunking drug tests and not coming for prob officer appt  now in jail for 2 yrs  69 yo grandson  Occupation: shift leader at [employer]  during day takes care of her grandchild  66 yo (daughters child) 4 d  Personnel officer of maintenance at [employer]      Risk Factors  Tobacco User: no  Smoking Status: never smoked  Passive smoke exposure: No    Drug use: no  Alcohol use: no  HIV high risk behavior: no    Exercise: No  Times per week: no  Exercise Comments:  "when I can"    Caffeine (drinks/day): 1  Sun exposure: rarely  Seatbelt use (%): 100            Vital Signs     Patient: 62 Years Old Female  Height:  57 in.  Weight: 124 lbs  Wt Chg: -2 since 09/13/2017  BMI:  26.93        27.36 on 09/13/2017  BP:  118/56 right arm, normal cuff, seated     132/78 on 09/13/2017   Temp:  97.7  F    oral  Pulse:  69       regular  Pulse Ox: 97 %  On Oxygen: No    Patient is not experiencing pain    Reached Menopause   Medications and Allergies Reviewed            Physical Exam    GENERALl:: WD/WN female, in no acute distress  EYE: PERRLA , no conjunctiva abnormality  ENT: EAC normal, TMs negative.  No sinus tenderness.  Pharynx negative.  NECK: Normal thyroid, no nodules, no adenopathy.  CHEST WALLl: No tenderness, no flank tenderness.   BREASTS: No masses/tender/dimpling  LUNGS: Clear throughout.  No adventitious sounds, moves air well.  COR: S1, S2 normal.  Regular rhythm, no murmurs.  ABDOMEN: Soft, mild tenderness R periumbilibal area, no masses no rebound intact  bowel sounds. No hepatosplenomegaly.    Vascular: pedal pulses intact.  No carotid  bruits.  Extremities: No edema.  No venous stasis change  NEURO oriented, alert, no focal motor deficits  dtr's intact gait intact  Psych  pleasant appropriate, normal affect-no evidence of depression or anxiety.           Assessment and Plan:     return 1 yr or as needed  unable to tolerate tetanus or flu shots  Mammogram: MGSCRTOMO on 04/23/2017    on .  Pap: SOUT on 12/21/2016.  Bone Density: Complete on 02/12/2017.  Colonoscopy: Complete on 09/21/2017.--repeat in 3 yrs  Above represents pre-visit preparations for this patient.  1. ABDOMINAL PAIN (R10.9)  R periumbilical pain to patlpation  check labs today  no raw fruits or vegetables in next 7-14 days  start omperazole 20 mg a day  does not take caffeine or alcohol  keep head of bed up at night  call if not improving, consider CT abdo pelvis    2. ELEVATED BLOOD PRESSURE READING WITHOUT DIAGNOSIS OF HYPERTENSION (R03.0)  borderline readings    3. DYSPEPSIA (K30)  instructions as above          Changes to Medication List Documented Today:   OMEPRAZOLE 20 MG ORAL CAPSULE DELAYED RELEASE (OMEPRAZOLE) 1 by mouth daily a needed  Orders:   Added new Service order of Patient Encounter ([id number]) - Signed  Added new Service order of Est Patient Visit - Level 2 (CLI1^EST2) - Signed  Added new Test order of COMMP -Comp. Metabolic Panel (CMP) - Signed  Added new Test order of CBCD -CBC with Diff** (CBCD) - Signed  Added new Test order of AMY - Amylase (AMY) - Signed  Added new Test order of LIPASE (LIPASE) - Signed  Added new Test order of LIPID -Lipid Panel** (LIPR) - Signed  Added new Service order of Prevent Med Est Pt 40-64 Yr ([id number]) - Signed    Patient Instructions    return 1 yr or as needed  unable to tolerate tetanus or flu shots  Mammogram: MGSCRTOMO on 04/23/2017    on .  Pap: SOUT on 12/21/2016.  Bone Density: Complete on 02/12/2017.  Colonoscopy: Complete on  09/21/2017.--repeat in 3 yrs  Above represents pre-visit preparations for this patient.  1. ABDOMINAL PAIN (R10.9)  R periumbilical pain to patlpation  check labs today  no raw fruits or vegetables  in next 7-14 days  start omperazole 20 mg a day  does not take caffeine or alcohol  keep head of bed up at night  call if not improving, consider CT abdo pelvis    2. ELEVATED BLOOD PRESSURE READING WITHOUT DIAGNOSIS OF HYPERTENSION (R03.0)  borderline readings    3. DYSPEPSIA (K30)  instructions as above        Medications:  OMEPRAZOLE 20 MG ORAL CAPSULE DELAYED RELEASE (OMEPRAZOLE) 1 by mouth daily a needed  #90[Capsule] x 0   Entered and Authorized by: [name] MD   Signed by: [name] MD on 12/27/2017   Method used: Electronically to      [id number] (retail)     [address]     [place], [place]  [address]     Ph: [phone] or [phone]     Fax: [phone]   RxID: [id number]

## 2017-12-30 ENCOUNTER — Ambulatory Visit

## 2017-12-30 NOTE — Progress Notes (Signed)
[facility] - [name], [place]   [address]   [place], [place] [address]  Office: [phone] Fax: [phone]  December 30, 2017      [name]  [address]  [place], [place]  [address]    Dear [name],    I have received the results of your most recent labwork. The results are listed below:     Labs Your Value Normal Result Date   Total Cholesterol: 252 Goal: less than 200 12/27/2017   HDL (good cholesterol):  100 Normal : >40 12/27/2017   LDL (bad cholesterol):  137 Goal: less than 130 12/27/2017   Triglycerides: 73 Goal: less than 200 12/27/2017     Blood sugar 106 Normal : 70-100 12/27/2017   Creatinine (kidney function) 0.65 Normal: 0.55  1.3   12/27/2017   ALT (liver test) 47 Normal ALT 6-55   12/27/2017   AST (liver test) 27  Normal AST 6-40 12/27/2017   Hematocrit   (blood count) 46.4 Normal female: 62- 34  Normal female: 40-52 12/27/2017    NORMAL lipase and amylase, pancreatic enzymes  If you are not improving on the omeprazole, consider either evaluation with GI doctor, or CT scan of the abdomen       Sincerely,        [name] MD

## 2018-01-10 ENCOUNTER — Ambulatory Visit

## 2018-01-10 NOTE — Telephone Encounter (Signed)
 Phone Note -       Initial call taken by: Esau Grew,  January 10, 2018 10:20 AM  Initial Details of Call:  Nettie calling re abd pain, she has discussed this before with Dr. Parke Poisson the pain she is having is the same it has always been no fever,  she was told if it continued that Dr. Parke Poisson would consider CT scan, per Dr. Loretta Plume last note this is the plan: call if not improving," consider CT abdo pelvis", I will discuss with Dr. Parke Poisson and call Byrd Hesselbach back       Follow-up #1  Details: recurrent persistent right periumbilical pain.  Please confirm that this is the symptom.  Labs normal liver proteins pancreatic proteins.  Schedule abdominal pelvic CT scan with oral and IV contrast  left message for patient to call us first thing in the morning so we know her schedule before we schedule the test  By: [name] MD ~ January 10, 2018 4:59 PM    Details: Spoke with Byrd Hesselbach- any time in am would work for this   By: Esau Grew ~ January 11, 2018 8:45 AM    Follow-up #2  Details: Prior auth initiated waiting on response   By: Enrigue Catena ~ January 11, 2018 10:41 AM    Details: Physician review is required for CT the # for the pcp to call is [phone]  By: Enrigue Catena ~ January 11, 2018 11:37 AM    Follow-up #3  Details: SPOKE [place] AMES  authorized wi [employer]  #[id number]    By: [name] MD ~ January 11, 2018 3:24 PM    Details: pt has CT friday 01/18/2018 @ [facility] for 9:00am   By: Enrigue Catena ~ January 16, 2018 9:05 AM          Orders:  Added new Test order of CT-Abdomen Pelvis ([id number]) - Signed

## 2018-01-11 ENCOUNTER — Ambulatory Visit

## 2018-01-14 ENCOUNTER — Ambulatory Visit

## 2018-01-14 ENCOUNTER — Ambulatory Visit: Admitting: Internal Medicine

## 2018-01-14 NOTE — Telephone Encounter (Signed)
 Phone Note -       Initial call taken by: Rosina Lowenstein MD,  January 14, 2018 10:19 AM  Initial Details of Call:  plz book Prolia with diabetes educator      Follow-up #1  Details: will work on [place]  Pa faxed  By: Letha Cape [place] ~ January 16, 2018 10:50 AM    Details:  pT HAS NO rX COVERAGE ON HER bcbs PLAN FOR MEDICAL BENEFITS  sPOKE WITH PT  By: Letha Cape [place] ~ February 06, 2018 8:59 AM    Follow-up #2  Details: what are her options?  By: Rosina Lowenstein MD ~ February 07, 2018 9:29 AM    Details: She has no rx coverage  By: Letha Cape [place] ~ February 07, 2018 10:33 AM

## 2018-01-14 NOTE — Progress Notes (Signed)
 ENDOCRINOLOGY ENCOUNTER    PCP: [name] MD  Chief Complaint: Osteoporosis  HPI: [name]    63 F with a history of GERD referred in endocrine consultation for osteoporosis at the request of PCP.   She was on alendronate from 2009 to 03-2013.She has been on drug holiday since.  Had been on risedronate before that.     02-2017: BMD revwied and worsening scores, spine -3.3, Hip -2.3, We had discussed doing Reclast last year but she was traveling to [place] and did not reschedule her Reclast.  No new fractures or falls.,09-2014 BMD with T scores -2.9 in AP spine and -1.7 in left femur.      She is on a Citracal with vitamin D twice daily.  The patient reports puberty occurred at age 63, similar to peers. Periods were regular.  She is G5P4.  Menopause occurred at age 44.  Was briefly on HRT at that time.    At around age 63 she was diagnosed with osteoporosis.  Her dairy intake: occasional yogurt, has almond milk every night.     She reports no extended period of prednisone use. She has no history of kidney stones.  She reports no constipation.   No polyuria.    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  Take with food; Route: ORAL  FAMOTIDINE 20 MG  ORAL TABLET (FAMOTIDINE) 1 tab a day; Route: ORAL  PROLIA 60 MG/ML SUBCUTANEOUS SOLUTION (DENOSUMAB) 60mg  subcutaneous every 6 months; Route: SUBCUTANEOUS      Allergies:    PCN, SULFA, * TETANUS, * FLU VACCINATION, * SEASONAL.          Review of Systems   General: No palpitations, no tremor, no heat intolerance. No cold intolerance.  No diarrhea or constipation. No change in hair or skin. No dysphagia, no odynophagia, no solid food or pill dysphagia. No diplopia/blurry vision.No fevers/chills.No change in voice.No anterior neck pressure. No increase in neck size.No wheeze or DOE.No rash.No edema. No cough.No anxiety/Depression. All other pertinent systems reviewed and are negative      Vital Signs   Weight: 125 lb. (56.82 kg.)  (Weight change since last visit: 1 lbs.)    Height: 57 in. (144.78 cm.)  BMI: 27.15    Blood Pressure: 130 /81 mm Hg.  Postion: sitting  Pulse Rate: 67  Comment: right arm    Letha Cape [place]......................January 14, 2018 9:11 AM        Physical Exam:   General: General: appears well,NAD  Vitals reviwed-stable  HEENT: No thyromegaly, EOMI  CVS: no peripheral edema  Resp:norm resp effort  Extremities: No lower extremity edema  Skin: intact, no rash  MS: ambulates well  Neuro: no tremor  Psych: Normal mood and affect.          Laboratory Results:   TSH: 0.80 (05/25/2010 10:17:00 AM)  TSH Ultra: 0.85 (01/15/2017 10:07:00 AM)  Vit D 25 31 NGM/ML (01/15/2017 10:07:00 AM)  ESR 10 (07/17/2012 10:24:00 AM)  Total Prot: 7.6 (12/27/2017 10:51:00 AM)  Albumin: 4.2 (12/27/2017 10:51:00 AM)  AST: 27 (12/27/2017 10:51:00 AM)  Alt: 47 (12/27/2017 10:51:00 AM)  Alkaline Phosphatase: 72 (12/27/2017 10:51:00 AM)  Hep C ab: Non-reactive (02/22/2012 9:53:00 AM)  CPK: 145 (07/15/2012 12:01:00 PM)  Glucose: 106 (12/27/2017 10:51:00 AM)  [facility]  Total Testosterone: [facility]  Sodium: 139 (12/27/2017 10:51:00 AM)  Potassium: 4.3 (12/27/2017 10:51:00 AM)  Chloride: 103  (12/27/2017 10:51:00 AM)  Bicarbonate 31 (12/27/2017 10:51:00 AM)  BUN: 13 (12/27/2017 10:51:00 AM)  Creatinine: 0.65 (12/27/2017 10:51:00 AM)  Serum Calcium: 9.9 (12/27/2017 10:51:00 AM)  Serum Magnesium: 2.1 (11/11/2013 10:47:00 AM)  Cholesterol: 252 (12/27/2017 10:51:00 AM)  LDL: 137 (12/27/2017 10:51:00 AM)  HDL: 100 (12/27/2017 10:51:00 AM)  Triglyceride: 73 (12/27/2017 10:51:00 AM)    Assessment & Plan:  63 F with a history of GERD referred in endocrine consultation for osteoporosis at the request of PCP.   She was on alendronate from 2009 to 03-2013.  Had been on risedronate before that. 09-2014 BMD with T scores -2.9 in AP spine and -1.7 in left femur.02-2017 spine -3.3, Hip -2.3  She has no history of fracture.  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   She was on alendronate for 6 years.  Her spinal BMD has declined since 2014 but probably not significant?it is still higher that it was compared to 2011 as bisphos worked. Hip BMD now worst.  We discussed options of both Reclast versus Prolia.  She had initially wanted to do Reclast but is very afraid of side effects and today wants to proceed with Prolia.  We will call her as soon as it is approved.  In the meantime, discussed goal of 1200 mg daily calcium via diet or pill. Gets vitamin D via citracal-D and MVI. Recent kidney function, calcium and vitamin D   Follow up in one year  Copy to referring provider.    Thank you for involving me in the care of your patient.    Kind regards,    Rosina Lowenstein, MD    Copy of note sent to referring provider  Note generated with dictation software and may contain errors        Patient's Instructions:   Disposition: prolia w/CDE

## 2018-01-18 ENCOUNTER — Ambulatory Visit

## 2018-01-19 ENCOUNTER — Ambulatory Visit

## 2018-01-19 NOTE — Telephone Encounter (Signed)
 Phone Note -       Initial call taken by: [name] MD,  January 19, 2018 10:45 AM  Initial Details of Call:  left message on phone that results normal--will send letter

## 2018-01-31 ENCOUNTER — Ambulatory Visit

## 2018-02-07 ENCOUNTER — Ambulatory Visit

## 2018-02-07 NOTE — Telephone Encounter (Signed)
 Phone Note -       Call back at [facility] [phone]  Initial call taken by: Rosina Lowenstein MD,  February 07, 2018 10:36 AM  Initial Details of Call:  called pt   left vm To see if she is able to get drug coverage.  If not the least we should do is start an oral bisphosphonate.  Asked her to call us back          Follow-up #1  Details: pt has no med coverage  what is the next step  By: Susan [place] ~ February 11, 2018 9:24 AM    Details: Fosamax may still be not expensive out-of-pocket.  She can call if pharmacy like [facility] and [facility] to see how much they will charge for 70 mg once a week  By: Rosina Lowenstein MD ~ February 14, 2018 9:24 AM    Follow-up #2  Details: Confirmed appt. for 02/18/2018  Action: Patient Notified  By: Alvy Beal  ~ February 14, 2018 11:36 AM

## 2018-02-18 ENCOUNTER — Ambulatory Visit

## 2018-02-18 ENCOUNTER — Ambulatory Visit: Admitting: Internal Medicine

## 2018-02-18 NOTE — Telephone Encounter (Signed)
 Phone Note -       Initial call taken by: Rosina Lowenstein MD,  February 18, 2018 1:02 PM  Initial Details of Call:  Please see if patient call her insurance to see about osteoporosis medication coverage.  She was told by her pharmacy the Fosamax would be covered.  So I think she does have drug coverage.      Follow-up #1  Details: S/W pt confirmed Fosamax is covered  By: Jarvis Newcomer [place] ~ February 18, 2018 1:12 PM

## 2018-02-18 NOTE — Progress Notes (Signed)
 ENDOCRINOLOGY ENCOUNTER    PCP: [name] MD  Chief Complaint: Osteoporosis  HPI: [name]    63 F with a history of GERD referred in endocrine consultation for osteoporosis at the request of PCP.   She was on alendronate from 2009 to 03-2013.She has been on drug holiday since.  Had been on risedronate before that.     02-2017: BMD reviewed and worsening scores, spine -3.3, Hip -2.3, We had discussed doing Reclast last year but she was traveling to [place] and did not reschedule her Reclast.  No new fractures or falls.Most recently we discussed doing Prolia but it turns out she does not have drug coverage.  [facility] and she would told that Fosamax will be covered.,09-2014 BMD with T scores -2.9 in AP spine and -1.7 in left femur.      She is on  vitamin D 1000 IU daily.  The patient reports puberty occurred at age 54, similar to peers. Periods were regular.  She is G5P4.  Menopause occurred at age 55.  Was briefly on HRT at that time.    At around age 63 she was diagnosed with osteoporosis.  Her dairy intake is minimal: occasional yogurt, has almond milk every night.     She reports no extended period of prednisone use. She has no history of kidney stones.  She reports no constipation.   No polyuria.    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  Take with food; Route: ORAL  FAMOTIDINE 20 MG ORAL TABLET (FAMOTIDINE) 1 tab a day; Route: ORAL  PROLIA 60 MG/ML SUBCUTANEOUS SOLUTION (DENOSUMAB) 60mg  subcutaneous every 6 months; Route: SUBCUTANEOUS      Allergies:    PCN, SULFA, * TETANUS, * FLU VACCINATION, * SEASONAL.          Review of Systems   General: No palpitations, no tremor, no heat intolerance. No cold intolerance.  No diarrhea or constipation. No change in hair or skin. No dysphagia, no odynophagia, no solid food or pill dysphagia. No diplopia/blurry vision.No fevers/chills.No change in voice.No anterior neck pressure. No increase in neck size.No wheeze or DOE.No rash.No edema. No cough.No anxiety/Depression. All other pertinent systems reviewed and are negative      Vital Signs   Weight: 125 lb. (56.82 kg.)  (Weight change since last visit: 0 lbs.)    Height: 57 in. (144.78 cm.)  BMI: 27.15    Blood Pressure: 132 /77 mm Hg.  Postion: sitting  Pulse Rate: 75  Comment: right arm    [name] [place].....................Marland KitchenFebruary 10, 2020 9:23 AM        Physical Exam:   General: General: appears well,NAD  Vitals reviwed-stable  HEENT: No thyromegaly, EOMI  CVS: no peripheral edema  Resp:norm resp effort  Extremities: No lower extremity edema  Skin: intact, no rash  MS: ambulates  well  Neuro: no tremor  Psych: Normal mood and affect.          Laboratory Results:   TSH: 0.80 (05/25/2010 10:17:00 AM)  TSH Ultra: 0.85 (01/15/2017 10:07:00 AM)  Vit D 25 31 NGM/ML (01/15/2017 10:07:00 AM)  ESR 10 (07/17/2012 10:24:00 AM)  Total Prot: 7.6 (12/27/2017 10:51:00 AM)  Albumin: 4.2 (12/27/2017 10:51:00 AM)  AST: 27 (12/27/2017 10:51:00 AM)  Alt: 47 (12/27/2017 10:51:00 AM)  Alkaline Phosphatase: 72 (12/27/2017 10:51:00 AM)  Hep C ab: Non-reactive (02/22/2012 9:53:00 AM)  CPK: 145 (07/15/2012 12:01:00 PM)  Glucose: 106 (12/27/2017 10:51:00 AM)  [facility]  Total Testosterone: [facility]  Sodium: 139 (12/27/2017 10:51:00 AM)  Potassium: 4.3 (12/27/2017 10:51:00 AM)  Chloride: 103 (12/27/2017 10:51:00 AM)  Bicarbonate 31 (12/27/2017 10:51:00 AM)  BUN: 13 (12/27/2017 10:51:00 AM)  Creatinine: 0.65 (12/27/2017 10:51:00 AM)  Serum Calcium: 9.9 (12/27/2017 10:51:00 AM)  Serum Magnesium: 2.1 (11/11/2013 10:47:00 AM)  Cholesterol: 252 (12/27/2017 10:51:00 AM)  LDL: 137 (12/27/2017 10:51:00 AM)  HDL: 100 (12/27/2017 10:51:00 AM)  Triglyceride: 73 (12/27/2017 10:51:00 AM)    Assessment & Plan:  63 F with a history of GERD referred in endocrine consultation for osteoporosis at the request of PCP.   She was on alendronate from 2009 to 03-2013.  Had been on risedronate before that. 09-2014 BMD with T scores -2.9 in AP spine and -1.7 in left femur.02-2017 spine -3.3, Hip -2.3  She has no history of fracture.  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   She was on alendronate for 6 years.  Her spinal BMD has declined since 2014 but probably not significant?it is still higher that it was compared to 2011 as bisphos worked. Hip BMD now worst.  We discussed options of both Reclast versus Prolia .  She had initially wanted to do Reclast but is very afraid of side effects and today wants to proceed with Prolia.  But it appears that she does not have a drug coverage.  She will confirm this again.  It does appear that oral Fosamax would be covered.      In the meantime, discussed goal of 1200 mg daily calcium via diet or pill. Gets vitamin D via citracal-D and MVI. Recent kidney function, calcium and vitamin D     Copy to referring provider.    Thank you for involving me in the care of your patient.    Kind regards,    Rosina Lowenstein, MD    Copy of note sent to referring provider  Note generated with dictation software and may contain errors

## 2018-04-17 ENCOUNTER — Ambulatory Visit

## 2018-04-17 NOTE — Telephone Encounter (Signed)
 Phone Note -       Initial call taken by: [name] MD,  April 17, 2018 3:50 PM  Initial Details of Call:  left message for pt to call  1)  ? is the abd pain better--peri umbilical--is she taking anything  2)  what did she decide to do re her bone health wi Dr Tedra [place]? take the prolia or the reclast? go back on fosomax?--it was not clear from the note      Follow-up #1  Details: called patient left message that I was calling from Dr Karen Kays office number given.   By: [name] [place] ~ April 18, 2018 8:56 AM    Details: I tried patient again  spoke with patient  her stomach is much better.   the omeprazole works she is taking daily.    for her bone health she wants to take fosamax she has been waiting for Dr Quentin Mulling office to call her. they have not I suggested she call them she has but unable to get through.   I gave her their office number [phone] to try she did not have this number will call office regarding fosamax.   By: [name]  ~ April 19, 2018 10:45 AM    Follow-up #2  Details: please let patient know  fosaamx sent   take it with full glass of water once a week, no lying down for 30 min after  I am sorry she had trouble getting through  does she have an email portal-she can email me next time  f/u in 51m   By: Rosina Lowenstein MD ~ April 19, 2018 12:48 PM    Details: s/w pt above info abaout fosamx relayed to pt  Action: Phone call completed  By: Susan [place]  ~ April 19, 2018 2:03 PM      Medications:  FOSAMAX 70 MG ORAL TABLET (ALENDRONATE SODIUM) Take 1 tab by mouth once a week with FULL glass water and NO lying down for 30 mins after  #12[Tablet] x 3   Route:ORAL   Entered and Authorized by: Rosina Lowenstein MD   Signed by: Rosina Lowenstein MD on 04/19/2018   Method used: Electronically to      [id number]     [address]     [place], [place]  [address]     Ph: [phone] or [phone]     Fax: [phone]   Note to Pharmacy: Route: ORAL;     RxID: [id number]        Medications:  Removed medication of PROLIA 60 MG/ML SUBCUTANEOUS SOLUTION (DENOSUMAB) 60mg  subcutaneous every 6 months; Route: SUBCUTANEOUS - Signed  Added new medication of FOSAMAX 70 MG ORAL TABLET (ALENDRONATE SODIUM) Take 1 tab by mouth once a week with FULL glass water and NO lying down for 30 mins after; Route: ORAL - Signed  Rx of FOSAMAX 70 MG ORAL TABLET (ALENDRONATE SODIUM) Take 1 tab by mouth once a week with FULL glass water and NO lying down for 30 mins after; Route: ORAL  #12[Tablet] x 3;  Signed;  Entered by: Rosina Lowenstein MD;  Authorized by: Rosina Lowenstein MD;  Method used: [facility] to [id number] [address] [place], [place]  [address], Ph: [phone] or [phone], Fax: [phone]; Note to Pharmacy: Route: ORAL;

## 2018-04-30 ENCOUNTER — Ambulatory Visit

## 2018-05-09 ENCOUNTER — Ambulatory Visit

## 2018-09-11 ENCOUNTER — Ambulatory Visit

## 2018-10-28 ENCOUNTER — Ambulatory Visit

## 2018-11-03 ENCOUNTER — Ambulatory Visit

## 2018-11-03 ENCOUNTER — Ambulatory Visit: Admitting: Physician Assistant

## 2018-11-03 ENCOUNTER — Ambulatory Visit: Admitting: Emergency Medicine

## 2018-11-03 NOTE — Telephone Encounter (Signed)
 Phone Note -       Follow up Call:: Post ER  Initial call taken by: [name] MD,  November 03, 2018 7:36 PM  Actual Caller: Nurse  Detail: F/U call Post ER 11/03/2018  Call For: Physician  Initial Details of Call:  please follow up  tripped over a pothole smashed her face  XR/CT all neg brain C sp face   for acute frx  she will be sore--dx concussion      Follow-up #1  Details: I called pt at [phone], she did not answer the phone, I did leave her a message this is Bonita Quin calling from Dr Karen Kays office to check in and see how your feeling after your fall, we hope you are feeling better, please call me back at [phone], thank you.  By: Dorthey Sawyer RN ~ November 04, 2018 8:21 AM    Details: Pt is returning call.  Attempted to transfer.  CB # [id number]  By: Alberteen Spindle ~ November 04, 2018 9:09 AM    Follow-up #2  Details: I spoke with Byrd Hesselbach, she said her face is still swollen, she is icing and taking Tylenol for discomfort and using soft pillow, I advised no asa products, she said she stayed home from work today to rest and she thanked me for calling, I told her to call me anytime if she needs.  By: Dorthey Sawyer RN ~ November 04, 2018 9:16 AM

## 2018-11-22 ENCOUNTER — Ambulatory Visit: Admitting: Physician Assistant

## 2018-11-22 ENCOUNTER — Ambulatory Visit

## 2018-11-22 NOTE — Telephone Encounter (Signed)
 Phone Note -     Patient    ***URGENT***    Call back at [facility] [phone]  Initial call taken by: Abel Presto (Patient Access Center),  November 22, 2018 9:14 AM  Initial Details of Call:  Patient states she fell 2 weeks ago, went to emergency room, and got a CAT Scan. Patient states her head feels heavy and is experiencing headaches. Patient states she has been taking ibuprofen. Transferred to [place] in office.    Best call back #: [phone]        Follow-up #1  Details: Spoke with patient  She fell 2 weeks ago went to ER had testing   c/o ongoing headache since then top of head pressure feeling 10/10 numbness at top of head. some dizzyness when she walks down stairs room is not spinning difficulty focusing.   no fever or chills no issues with vision   non relenting h/a    reviewed with Dr Parke Poisson needs to go back to ER for further imaging regarding recent fall and continuing h/a  By: [name] [place] ~ November 22, 2018 9:56 AM    Details: Called patient and informed her that Dr Parke Poisson would like her to return to Er for further evaluation of ongoing 10/10 headaches focusing issues some dizzyness. the patient understands but she said she was there  5 hours before they attended to her.  I explained that with the ongoing h/a at 10/10 she really needs to go back for further imaging.   she understood she is also concerned that something may be wrong.   she needs to wait until her husband comes home.at 1pm. Then will go to ER.   I called ER at [facility] and spoke with Dot Lanes and gave her patient information   By: [name] [place] ~ November 22, 2018 10:19 AM    Follow-up #2  Details: APPT 12/31/18  By: [name] MD ~ December 25, 2018 8:44 AM

## 2018-12-27 ENCOUNTER — Ambulatory Visit

## 2018-12-31 ENCOUNTER — Ambulatory Visit

## 2018-12-31 NOTE — Progress Notes (Signed)
 Primary Provider:  [name] MD      History of Present Illness:  [name] is a 63 year old Female who presents today for: Complex Visit  Is there an updated release of information to speak for the patient on file ?yes   Specialists seen since last visit?ER  Has previsit planning and a huddle been performed on this patient? yes ..................................................................Marland KitchenArlana Pouch Rivers  December 31, 2018 9:18 AM      This note is generated with a voice-activated system, transcription errors can occur.    [name] is here today for comprehensive complex visit.  She has been dealing with headaches post fall in the parking lot.  She missed the pothall because it was the evening--she smashed her face and head.  Had 2 ER visits.  CT of the head done twice show no acute findings.  She has had some intermittent dizziness and difficulty with concentration.  She been advised to take ibuprofen for headache.  Symptoms took up to 4 weeks to clear, but she was able to work.  she struggles with osteoporosis, having trouble with her insurance, Reclast had been advised but this was not covered.  Her most recent bone density from 2019 showed spine -3.3/hip -2.3.  She will be due in February 2021.  Up-to-date with mammogram and Pap smear.  Due for colonoscopy in 2022.          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  Marital Status:  married--her husb at [employer]  Children:  4  Lives With:  husb, grandson, 2 youngest children.  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, community in [place]   one child with drug problem: h/o incarcerated for flunking drug tests and not coming for prob officer appt  now in jail for 2 yrs  77 yo grandson  Occupation: shift leader at [employer]    3 yo (daughters child) 4 d  husb Interior and spatial designer of maintenance at [employer]      Risk Factors  Tobacco User: no  Smoking Status: never smoked  Passive smoke exposure: No    Drug use: no  Alcohol use: no  HIV high risk behavior: no    Exercise: No  Times per week: no  Exercise Comments:  "when I can"    Caffeine (drinks/day): 1  Sun exposure: rarely  Seatbelt use (%): 100  Bladder Control  issues: no  Safety concerns: no  Falls Information:  Past year - yes    Additional Data Recorded today    Eye Exam:  vision works  on  12/03/2017      Gen:  normal energy, sleep  Eye: No changes in vision  ENT: No changes in hearing  Card: No chest pain, pressure, , palpitations, syncope. some shortness of breath.  Pulm: No cough, sputum production, pain with  inspiration  GI:  No dyspepsia, abdominal pain, change in bowel habits, blood in stool.  GU: No hesitancy, nocturia, incontinence.  Musc-Skel:  no spine /jt/musc pain  Neuro:  No headaches, dizziness, memory loss, falls.--After her fall she had dizziness and headaches, all have subsided  Psych: No anxiety or depression  Review of Systems     Vital Signs     Patient: 63 Years Old Female  Height:  57 in.  Weight: 130 lbs      Wt Chg: 5 since 02/18/2018  BMI:  28.23        27.15 on 02/18/2018  BP:  130/80 right arm, normal cuff, seated     132/77 on 02/18/2018   Temp:  97.7  F      Pulse:  68         Pulse Ox: 98 %    Patient is not experiencing pain    Medications and Allergies Reviewed    Signed: [name] Driscoll.Marland KitchenMarland KitchenMarland KitchenDecember 22, 2020 9:23 AM    PHQ 2    Over the last 2 weeks, how often have you been bothered by any of the following problems?  1. Little interest or pleasure in doing things:  0   - Not at all  2. Feeling down, depressed, or hopeless:  0   - Not at all          Physical Exam    GENERALl:: WD/WN female, in no acute distress  EYE: PERRLA , no conjunctiva abnormality  no tenderness of the forehead scalp or face on the right side  ENT: EAC normal, TMs negative.  No sinus tenderness.  Pharynx wearing mask.  NECK: Normal thyroid, no nodules, no adenopathy.  CHEST WALLl: No tenderness, no flank tenderness.   BREASTS: No masses/tender/dimpling  LUNGS: Clear throughout.  No adventitious sounds, moves air well.  COR: S1, S2 normal.  Regular rhythm, no murmurs.  ABDOMEN: Soft, nontender, intact bowel sounds. No hepatosplenomegaly.    Vascular:, carotid, pedal pulses intact.  No carotid  bruits.  Extremities: No edema.  No venous stasis change--small bruise left upper thigh  NEURO oriented, alert, no focal motor deficits  dtr's intact gait intact  Psych  pleasant appropriate, normal affect-no evidence of depression or anxiety.           Assessment and Plan:     follow-up appointments 5 months welcome to Medicare  visit  She is allergic to the tetanus and the flu shot  Mammogram: MGSCRTOMO on 09/11/2018    on .  Pap: SOUT on 12/21/2016.  Bone Density:  Complete on 02/12/2017.  Colonoscopy: Complete on 09/21/2017.  Above represents pre-visit preparations for this patient.  1. DYSPEPSIA (K30)  Continues with omeprazole as needed.  Being careful with her diet.  Monitor    2. ELEVATED BLOOD PRESSURE READING WITHOUT DIAGNOSIS OF HYPERTENSION (R03.0)  blood pressure nicely controlled today    3. MOOD DISORDER (F39)  on no mood medications.  Some stability at home.  No longer cares for her grandchild because of the virus and her daughter is at home taking care of the baby    4. 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 OSTEOPOROSIS (M81.0)  advise repeat bone density after February 4 in 2021, assess status   In 2019 spine -3.3/hip -2.3  Dr. Antony Blackbird had seen her        Med Compliance and SE's: Pt is compliant with meds with no side effects     Medications Removed Today:   FAMOTIDINE 20 MG ORAL TABLET (FAMOTIDINE) 1 tab a day; Route: ORAL  Orders:   Added new Service order of Patient Encounter ([id number]) - Signed  Added new Service order of Est Patient Visit - Level 3 (CLI1^EST3) - Signed  Added new Test order of Bone Density** (BEDEN) - Signed  Added new Service order of Est Patient Visit - Level IV ([id number]) - Signed    Patient Instructions    follow-up appointments 5 months welcome to Medicare visit  She is allergic to the tetanus and the flu shot  Mammogram: MGSCRTOMO on 09/11/2018    on .  Pap: SOUT on 12/21/2016.  Bone Density: Complete on 02/12/2017.  Colonoscopy: Complete on 09/21/2017.  Above represents pre-visit preparations for this patient.  1. DYSPEPSIA (K30)  Continues with omeprazole as needed.  Being careful with her diet.  Monitor    2. ELEVATED BLOOD PRESSURE READING WITHOUT DIAGNOSIS OF HYPERTENSION (R03.0)  blood pressure nicely controlled today    3. MOOD DISORDER (F39)  on no  mood medications.  Some stability at home.  No longer cares for her grandchild because of the virus and her daughter is at home taking care of the baby    4. 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 OSTEOPOROSIS (M81.0)  advise repeat bone density after February 4 in 2021, assess status   In 2019 spine -3.3/hip -2.3  Dr. Antony Blackbird had seen her

## 2019-02-19 ENCOUNTER — Ambulatory Visit

## 2019-03-03 ENCOUNTER — Ambulatory Visit

## 2019-03-09 ENCOUNTER — Ambulatory Visit

## 2019-03-09 NOTE — Progress Notes (Signed)
 [facility] - [name], MD   [address]   [place], [place] [address]  Office: [phone] Fax: [phone]  Understanding your Bone Density Results    03/09/2019    [name]  [address]   [place], [place] [address]    Your T-Score is:      Spine:  -3.0  Left Hip:  -1.9  Right Hip: These suggest that you have:  (  ) Normal Bones     (  ) Thinning Bones   x(  ) Osteoporosis  (  ) Normal Bones     (x  ) Thinning Bones   (  ) Osteoporosis  (  ) Normal Bones     (  ) Thinning Bones   (  ) Osteoporosis     What a T-Score Means:  If a T-Score  at the Hip is:   Then Bone Mass is: And Risk of Fracture  at the Spine is:                   at the Hip is:   (Normal)     0.0  to  -0.9 Normal to low-normal Minimal                                   Minimal     (Thinning)  -1.0  to  -1.4 10% to 15% below average =2.3 times greater                =2.6 times greater     (Thinning)  -1.5  to  -1.9 15% to 20% below average =3 times greater                  =4 times greater     (Thinning)  -2.0  to  -2.4 20% to 25% below average =5 times greater                  =7 times greater     (Osteoporosis)-2.5 or lower More that 25% below average =8 times greater                  =11 times greater            Treatment?  0.0  to -1.5 Maintain your bone health with proper diet, exercise, calcium and vitamin D supplements.   -1.5 to -2.0 Treatment is recommended if you have one or more risk factors listed below.   -2.0 or below Prescription treatment is recommended. Speak with your doctor today.   Major risk factors for osteoporosis and related fracture in Caucasian postmenopausal women:   Additional risk factors include:   (  ) Personal history of fracture as an adult  (  ) Current smoker  (  ) History of fragility fracture in a first-degree relative  (  ) Use of oral corticosteroid therapy for more that 3 months  (  ) Low body weight (<127 lb)   (  ) Impaired vision  (  ) Recent falls  (  ) Estrogen deficiency at an  early age   (<45 years)  (  ) Low calcium intake (lifelong)  (  ) Dementia  (  ) Low physical activity  (  ) Poor health/frailty  (  ) Alcohol in amounts >2 drinks per day   Treatment is recommended if you  have had a prior vertebral hip fracture, regardless   of your T-score.  improved from 2019 spine -3.3/hip -2.3   please follow up with Dr Tedra Senegal for recommendations re next steps    [name] MD

## 2019-03-14 ENCOUNTER — Ambulatory Visit

## 2019-03-14 NOTE — Telephone Encounter (Signed)
 Phone Note -       Initial call taken by: Letha Cape [place],  March 14, 2019 2:04 PM  Initial Details of Call:  rx requested  pt needs appt      Follow-up #1  Action: Left Message for Patient  By: Letha Cape Lyons ~ March 14, 2019 2:04 PM    Action: Left Message for Patient  By: Letha Cape [place] ~ March 17, 2019 8:26 AM    Follow-up #2  Details: Spoke to the pt and a appt was booked   By: Yasmin C. Ramos [place] ~ March 17, 2019 10:02 AM

## 2019-03-15 ENCOUNTER — Ambulatory Visit: Admitting: Infectious Disease

## 2019-03-15 ENCOUNTER — Ambulatory Visit

## 2019-03-15 NOTE — Progress Notes (Signed)
 COVID-19 Moderna 100 mcg  [CVX:207 OYD:[id number]]  SERIES:  1  ADMINISTERED:  03/15/2019  ADMINISTERED BY:  STEPHANIE R. PALMER  SITE:  Left Deltoid  OBSERVED FOR 15 MINUTES:  Y  TRANSFERRED TO THE ED:  N  REACTION:  N  REACTION COMMENTS:

## 2019-04-08 ENCOUNTER — Ambulatory Visit

## 2019-04-12 ENCOUNTER — Ambulatory Visit: Admitting: Infectious Disease

## 2019-04-12 ENCOUNTER — Ambulatory Visit

## 2019-04-12 NOTE — Progress Notes (Signed)
COVID-19 Moderna 100 mcg  [CVX:207 [id number]:[id number]]  SERIES:  2  ADMINISTERED:  04/12/2019  ADMINISTERED BY:  STEPHANIE R. PALMER  SITE:  Left Deltoid  OBSERVED FOR 15 MINUTES:  Y  TRANSFERRED TO THE ED:  N  REACTION:  N  REACTION COMMENTS:

## 2019-05-02 ENCOUNTER — Ambulatory Visit

## 2019-05-02 NOTE — Telephone Encounter (Signed)
 Phone Note -     Patient    Call back at [facility] [phone]  Initial call taken by: Barron Schmid,  May 02, 2019 1:18 PM  Initial Details of Call:  Confirmed DOS 4/27 @ 1015am w/ Dr. Tedra Senegal at [facility]. We are asking you to attest for your own safety and the safety of our staff.    Fever  SOB  Headaches  New loss of smell/taste  Fatigue  New cough(not related to chronic condition)  New nasal congestion or runny nose (not related to seasonal allergies)  Muscle aches  Aches and Pains  Sore throat  Diarrhea  Have you been tested for COVID?  If so, Where (facility) ?  Date?  Covid Result:    Positive / Negative  Has anyone you?ve been in contact with or in your household tested positive for covid? If YES When?  NO sypmtoms - had both vaccinations.

## 2019-05-06 ENCOUNTER — Ambulatory Visit

## 2019-05-06 ENCOUNTER — Ambulatory Visit: Admitting: Internal Medicine

## 2019-05-06 NOTE — Progress Notes (Signed)
 ENDOCRINOLOGY ENCOUNTER    PCP: [name] MD  Chief Complaint: Osteoporosis  HPI: [name]    64 F with a history of GERD referred in endocrine consultation for osteoporosis at the request of PCP.   She was on alendronate from 2009 to 03-2013.She has been on drug holiday since.  Had been on risedronate before that.     04-2019: on fosamamx again for past 66yr-no s/e, She is on  vitamin D 1000 IU daily., no falls/fractures  02-2017: BMD reviewed and worsening scores, spine -3.3, Hip -2.3, We had discussed doing Reclast last year but she was traveling to [place] and did not reschedule her Reclast.  No new fractures or falls.Most recently we discussed doing Prolia but it turns out she does not have drug coverage.  Patient went to [facility] and she would told that Fosamax will be covered.,09-2014 BMD with T scores -2.9 in AP spine and -1.7 in left femur.        The patient reports puberty occurred at age 58, similar to peers. Periods were regular.  She is G5P4.  Menopause occurred at age 25.  Was briefly on HRT at that time.    At around age 2 she was diagnosed with osteoporosis.  Her dairy intake is minimal: occasional yogurt, has almond milk every night.     She reports no extended period of prednisone use. She has no history of kidney stones.  She reports no constipation.   No polyuria.    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) (ICD10-E55.9)  DYSPEPSIA (ICD-536.8) (ICD10-K30)  ABDOMINAL PAIN (ICD-789.00) (ICD10-R10.9)  ELEVATED BLOOD PRESSURE READING WITHOUT DIAGNOSIS OF HYPERTENSION (ICD-796.2) (ICD10-R03.0)  MOOD DISORDER (ICD-296.90) (ICD10-F39)  BLURRED VISION (ICD-368.8) (ICD10-H53.8)  PREVENTIVE HEALTH CARE (ICD-V70.0) (ICD10-Z00.00)  ADENOMATOUS COLONIC POLYP (ICD-211.3) (ICD10-D12.6)  OSTEOARTHRITIS (ICD-715.90) (ICD10-M19.90)  OSTEOPOROSIS (ICD-733.00) (ICD10-M81.0)  ALLERGY (ICD-995.3) (ICD10-T78.40)      Medication List:   CITRACAL/VITAMIN D TABLET (CALCIUM CITRATE-VITAMIN D TABS) one tab  daily  MULTIVITAMINS TABS (MULTIPLE VITAMIN) once daily  OMEPRAZOLE 20 MG ORAL CAPSULE DELAYED RELEASE (OMEPRAZOLE) 1 by mouth daily a needed  IBUPROFEN 600 MG ORAL TABLET (IBUPROFEN) 1 by mouth every six hours as needed for pain.  Take with food; Route: ORAL  FOSAMAX 70 MG ORAL TABLET (ALENDRONATE SODIUM) Take 1 tab by mouth once a week with FULL glass water and NO lying down for 30 mins after; Route: ORAL  ALENDRONATE SODIUM 70 MG TABS (ALENDRONATE SODIUM) TAKE 1 TAB BY MOUTH ONCE A WEEK WITH FULL GLASS WATER AND NO LYING DOWN FOR 30 MINS AFTER      Allergies:    PCN, SULFA, * TETANUS, * FLU VACCINATION, * SEASONAL.      Histories:   Past Medical History: No hx of mental illness    Past Surgical History: none  Social History: Born in [place], emigrated at age 83.  Marital Status:  married--her husb at [employer]  Children:  4  Lives With:  husb, grandson, 2 youngest children.  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 No cold intolerance.  No diarrhea or constipation. No change in hair or skin. No dysphagia, no odynophagia, no solid food or pill dysphagia. No diplopia/blurry vision.No fevers/chills.No change in voice.No anterior neck pressure. No increase in neck size.No wheeze or DOE.No rash.No edema. No cough.No anxiety/Depression. All other pertinent systems reviewed and are  negative      Vital Signs   Weight: 135 lb. (61.36 kg.)  (Weight change since last visit: 5 lbs.)    Height: 57 in. (144.78 cm.)  BMI: 29.32    Blood Pressure: 132 /83 mm Hg.  Postion: sitting  Pulse Rate: 736  Comment: right arm    Letha Cape Lequire....................Marland KitchenMarland KitchenApril 27, 2021 10:07 AM        Physical Exam:   General: General: appears well,NAD  Vitals reviwed-stable  HEENT: No thyromegaly, EOMI  CVS: no peripheral edema  Resp:norm resp effort  Extremities: No lower extremity edema  Skin: intact, no rash  MS: ambulates well  Neuro: no tremor  Psych: Normal mood and affect.          Laboratory Results:   TSH: 0.80 (05/25/2010 10:17:00 AM)  TSH Ultra: 0.85 (01/15/2017 10:07:00 AM)  Vit D 25 31 NGM/ML (01/15/2017 10:07:00 AM)  ESR 10 (07/17/2012 10:24:00 AM)  Sodium: 139 (12/27/2017 10:51:00 AM)  Potassium: 4.3 (12/27/2017 10:51:00 AM)  Chloride: 103 (12/27/2017 10:51:00 AM)  Bicarbonate 31 (12/27/2017 10:51:00 AM)  BUN: 13 (12/27/2017 10:51:00 AM)  Creatinine: 0.65 (12/27/2017 10:51:00 AM)  Serum Calcium: 9.9 (12/27/2017 10:51:00 AM)  Serum Magnesium: 2.1 (11/11/2013 10:47:00 AM)  Cholesterol: 252 (12/27/2017 10:51:00 AM)  LDL: 137 (12/27/2017 10:51:00 AM)  HDL: 100 (12/27/2017 10:51:00 AM)  Triglyceride: 73 (12/27/2017 10:51:00 AM)    Assessment & Plan:  64 F with a history of GERD referred in endocrine consultation for osteoporosis at the request of PCP.   She was on alendronate from 2009 to 03-2013.  Had been on risedronate before that. 09-2014 BMD with T scores -2.9 in AP spine and -1.7 in left femur.02-2017 spine -3.3, Hip -2.3  She has no history of fracture.  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   She was on alendronate for 6 years.  Her spinal BMD has declined since 2014 but probably not significant?it is still higher that it was compared to 2011 as bisphos worked. Hip BMD now worst.  We discussed options of both Reclast versus  Prolia .  She had initially wanted to do Reclast but is very afraid of side effects and today wanted Prolia.  But it appears that she does not have a drug coverage.  She has been oral Fosamax again since 03-2019, f/u BMD 03-2020, labs next month    In the meantime, discussed goal of 1200 mg daily calcium via diet or pill. Gets vitamin D via citracal-D and MVI. Recent kidney function, calcium and vitamin D     Copy to referring provider.    Thank you for involving me in the care of your patient.    Kind regards,    Rosina Lowenstein, MD    Copy  of note sent to referring provider  Note generated with dictation software and may contain errors        Patient's Instructions:   Disposition: labs next month,RTC 03-2020 w/BMD

## 2019-05-06 NOTE — Telephone Encounter (Signed)
 Phone Note -       Initial call taken by: Letha Cape [place],  May 06, 2019 10:11 AM  Initial Details of Call:  We are asking you to attest for your own safety and the safety of our staff.    Fever  SOB  Headaches  New loss of smell/taste  Fatigue  New cough(not related to chronic condition)  New nasal congestion or runny nose (not related to seasonal allergies)  Muscle aches  Aches and Pains  Sore throat  Diarrhea  Have you been tested for COVID?  If so, Where (facility) ?  Date?  Covid Result:    Positive / Negative  Has anyone you?ve been in contact with or in your household tested positive for covid? If YES When?  pt answered no to all questions

## 2019-05-06 NOTE — Telephone Encounter (Signed)
 Phone Note -       Initial call taken by: Barron Schmid,  May 06, 2019 10:43 AM  Initial Details of Call:  Booked 60yr f/u and gave lab orders. Working on Bone Density.       Follow-up #1  Details: Left VM to adv Bone Density will not be done until 02/2021. PT will continue as is and Dr will see in 71yr.   By: Barron Schmid ~ May 08, 2019 2:39 PM

## 2019-05-06 NOTE — Telephone Encounter (Signed)
 Phone Note -       Initial call taken by: Barron Schmid,  May 06, 2019 2:10 PM  Initial Details of Call:  Dr. Tedra Senegal - the bone density is dated 05/06/2019 - should it be 2022? The PT last Bone Density was 02/23/2018. Thanks      Follow-up #1  Details: Last BMD was on 03/03/2019, Dr. Parke Poisson ordered it.   Patient will be due after 03/02/2021.   By: Alvy Beal Lyons ~ May 07, 2019 3:03 PM    Details: please let patient know  BMD revwied that was orered by Dr Parke Poisson and shows improvement  continue fosaamx and I will see her in1 yr   By: Rosina Lowenstein MD ~ May 08, 2019 12:24 PM    Follow-up #2  Details: Left VM for PT to advise - thank you everyone!.   ByMarland Kitchen Barron Schmid ~ May 08, 2019 2:48 PM          Orders:  Added new Test order of Bone Density** (BEDEN) - Signed

## 2019-06-02 ENCOUNTER — Ambulatory Visit

## 2019-06-02 LAB — HX VITAMIN D,25 HYDROXY: HX VITAMIN D,25 HYDROXY: 33 ng/mL (ref 30.0–100.0)

## 2019-06-02 LAB — HX ALBUMIN: HX ALBUMIN: 3.9 g/dL (ref 3.2–5.0)

## 2019-06-02 NOTE — Progress Notes (Signed)
 Visit Type:  medicare welcome   Primary Provider:  [name] MD      History of Present Illness:  [name] is a 64 year old Female who presents today for: medicare welcome     Is there an updated release of information to speak for the patient on file ?     Specialists seen since last visit? Orthopedic    Has previsit planning and a huddle been performed on this patient? yes     ..................................................................Marland KitchenWilburn Mylar [place]  Jun 02, 2019 9:76 AM    64 year old woman here in follow-up but he also here for a Medicare welcome visit.  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  Take with food  ALENDRONATE SODIUM 70 MG TABS: TAKE 1 TAB BY MOUTH ONCE A WEEK WITH FULL GLASS WATER AND NO LYING DOWN FOR 30 MINS AFTER    Current Allergies- Reviewed during today's visit  PCN (Critical)  SULFA (Critical)  * TETANUS (Critical)  * FLU VACCINATION (Critical)  * SEASONAL (Mild)    Current Directives- Reviewed during today's  visit  09/13/17 AUTH TO DISCUSSJOSE SIVA (HUSB) /  FULL CODE  HEALTH CARE PROXY 07-15-13    Risk Factors  Bladder Control  issues: no  Safety concerns: no  Falls Information:  Past 6 months - No  Past year - No            Vital Signs     Patient: 64 Years Old Female  Height:  57 in.  Weight: 133 lbs      Wt Chg: -2 since 05/06/2019  BMI:  28.88        29.32 on 05/06/2019  BP:  134/78 right arm, normal cuff, seated     132/83 on 05/06/2019   Temp:  96.91  F    temporal  Pulse:  71       regular  Pulse Ox: 97 %  On Oxygen: No    Patient is not experiencing pain    Comments: NO Concerns today  - [facility]   Medications and Allergies Reviewed    Signed: Wilburn Mylar [place]....Jun 02, 2019 9:21 AM    PHQ 2    Over the last 2 weeks, how often have you been bothered by any of the following problems?  1. Little interest or pleasure in doing things:  0   - Not at all  2. Feeling down, depressed, or hopeless:  0   - Not at all  Eye Exam   Corrected Vision Right Eye 20/50  Corrected Vision Left Eye 20/50      PATIENT HEALTH QUESTIONNAIRE-9  (PHQ-9)    Over the last 2 weeks, how often have you been bothered by any of the following problems?   1. Little interest or pleasure in doing things:  0   - Not at all  2. Feeling down, depressed, or hopeless:  0   - Not at all  3. Trouble falling or staying asleep, or sleeping too much:  0   - Not at all  4. Feeling tired or having little energy:  0   - Not at all  5. Poor appetite or overeating:  0   - Not at all  6. Feeling bad about yourself - or that you are a failure or have let yourself or your family down:  0   - Not at all  7. Trouble concentrating on things, such as reading the newspaper or watching television:  0   - Not at all  8. Moving or speaking so slowly that other people could have noticed?  Or the opposite - being so fidgety or restless that you have been moving   around a lot more than usual:  0   - Not at all  9. Thoughts that you would  be better off dead or of hurting yourself in some way:  0   - Not at all    Total Score: 0    If you checked off any problems, how difficult have these problems made it for you to do your work, take care of things at home, or get along with other people?  Not difficult at all            Physical Exam    General:      well developed, well nourished, in no acute distress.    Lungs:      clear bilaterally to auscultation.    Heart:      non-displaced PMI, chest non-tender; regular rate and rhythm, S1, S2 without murmurs, rubs, or gallops  Extremities:      no ankle edema  Psych:      pleasant and appropriate         Assessment and Plan:     next appointment with me is scheduled for 01/06/2020  Has received the COVID vaccine  Needs to get THE pNEUMOVAX VACCINE AT THE LOCAL PHARMACY    Mammogram: MGSCRTOMO on 09/11/2018    on .  Pap: SOUT on 12/21/2016.  Bone Density: Complete on 03/03/2019.  Colonoscopy: Complete on 09/21/2017.---due with Dr. Chestine Spore in 2022  Above represents pre-visit preparations for this patient.  1. DYSPEPSIA (K30)  her symptoms of heartburn are controlled with omeprazole which she takes on an as-needed basis.      2. OSTEOPOROSIS (M81.0)  continues on alendronate.  Discussed doing exercises including walking and upper extremity exercises.  150-180 minutes a week.--getting the lab work for endocrine today, follow-up with them in 2022    3. VITAMIN D DEFICIENCY (E55.9)  having vitamin D level done today through endocrine.        Med Compliance and SE's: Pt is compliant with meds with no side effects     Medications Removed Today:   FOSAMAX 70 MG ORAL TABLET (ALENDRONATE SODIUM) Take 1 tab by mouth once a week with FULL glass water and NO lying down for 30 mins after; Route: ORAL  Orders:   Added new  Service order of Patient Encounter ([id number]) - Signed  Added new Service order of Est Patient Visit - Level 3 (CLI1^EST3) - Signed  Added new Service order of MCR Welcome ([id number]) - Signed  Added new  Service order of Est Patient Visit - Level III ([id number]) - Signed    Patient Instructions    next appointment with me is scheduled for 01/06/2020  Has received the COVID vaccine  Needs to get THE pNEUMOVAX VACCINE AT THE LOCAL PHARMACY    Mammogram: MGSCRTOMO on 09/11/2018    on .  Pap: SOUT on 12/21/2016.  Bone Density: Complete on 03/03/2019.  Colonoscopy: Complete on 09/21/2017.---due with Dr. Chestine Spore in 2022  Above represents pre-visit preparations for this patient.  1. DYSPEPSIA (K30)  her symptoms of heartburn are controlled with omeprazole which she takes on an as-needed basis.      2. OSTEOPOROSIS (M81.0)  continues on alendronate.  Discussed doing exercises including walking and upper extremity exercises.  150-180 minutes a week.--getting the lab work for endocrine today, follow-up with them in 2022    3. VITAMIN D DEFICIENCY (E55.9)  having vitamin D level done today through endocrine.        Medications:  Cancelled FOSAMAX 70 MG ORAL TABLET (ALENDRONATE SODIUM) Take 1 tab by mouth once a week with FULL glass water and NO lying down for 30 mins after  #4[Tablet] x 0   Route:ORAL   Entered by: [name] MD   Authorized by: Rosina Lowenstein MD   Signed by: [name] MD on 06/02/2019   Method used: Electronically to      [id number] (retail)     [address]     [place], [place]  [address]     Ph: [phone] or [phone]     Fax: [phone]   RxID: [id number]            Medicare Wellness Visit    Vital Signs   Weight: 133 lb.     Height: 57 in.   BMI: 28.88   BSA: 1.51    BP: 134/78  Pulse: 71  Temperature:  96.91  Reached menopausal  Visual Acuity    Corrected - L: 20/50 R: 20/50  BP #1: 134 / 78    List of providers and suppliers caring for patient   Other Providers or Suppliers: Dr Tedra Senegal  vision works  Dr Cherly Hensen [facility] for specialist  Dr Chestine Spore GI        Current Problems  VITAMIN D DEFICIENCY (ICD-268.9) (ICD10-E55.9)  DYSPEPSIA (ICD-536.8) (ICD10-K30)  ELEVATED BLOOD PRESSURE READING WITHOUT  DIAGNOSIS OF HYPERTENSION (ICD-796.2) (ICD10-R03.0)  MOOD DISORDER (ICD-296.90) (ICD10-F39)  PREVENTIVE HEALTH CARE (ICD-V70.0) (ICD10-Z00.00)  ADENOMATOUS COLONIC POLYP (ICD-211.3) (ICD10-D12.6)  OSTEOARTHRITIS (ICD-715.90) (ICD10-M19.90)  OSTEOPOROSIS (ICD-733.00) (ICD10-M81.0)  ALLERGY (ICD-995.3) (ICD10-T78.40)    Current Medications- Reviewed during today's visit  CITRACAL/VITAMIN D TABLET (CALCIUM CITRATE-VITAMIN D TABS) one tab daily  MULTIVITAMINS TABS (MULTIPLE VITAMIN) once daily  OMEPRAZOLE 20 MG ORAL CAPSULE DELAYED RELEASE (OMEPRAZOLE) 1 by mouth daily a needed  IBUPROFEN 600 MG ORAL TABLET (IBUPROFEN) 1 by mouth every six hours as needed for pain.  Take with food; Route: ORAL  ALENDRONATE SODIUM 70 MG TABS (ALENDRONATE SODIUM) TAKE 1 TAB BY MOUTH ONCE A WEEK WITH FULL GLASS WATER AND NO LYING DOWN FOR 30 MINS AFTER    Current Allergies- Reviewed during today's visit  PCN (Critical)  SULFA (Critical)  * TETANUS (Critical)  * FLU VACCINATION (Critical)  * SEASONAL (Mild)  Current Directives- Reviewed during today's visit  09/13/17 AUTH TO DISCUSSJOSE SIVA (HUSB) /  FULL CODE  HEALTH CARE PROXY 07-15-13    Past Medical History  No hx of mental illness      Surgical History  none    Social History  Born in [place], emigrated at age 66.  Marital Status:  married--her husb at [employer]  Children:  4  Lives With:  husb, grandson, 2 youngest children.  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  1. Little interest or pleasure in doing things:  0   - Not at all  2. Feeling down, depressed, or hopeless:  0   - Not at all    Myrtis Ser Index of Independence in ADL's  Bathing: Independence  Dressing: Independence  Toileting: Independence  Transferring: Independence  Continence: Independence  Feeding: Independence    ADL Score: 6 (The patient is independent.)    Home Safety  Adequate housing - Yes  Transportation - Yes  Sufficient food - Yes  Telephone access - Yes  Actual/threats of physical/emotional abuse - No  Actual/threats of sexual abuse - No  Feel safe at home - Yes  Home Safety Assessment Needed :  No    Balance:  Difficulties with balance - No  Experienced lightheadedness/dizziness w/ position changes - No  Episodes of dizziness - No  Experienced blackouts - No    LOF Comments:  glasses progressive            Education and Counseling for patient due within the next 10 years  The following Services are recommended for screening and disease prevention.

## 2019-06-02 NOTE — Progress Notes (Signed)
 Directives:  Changed directive from 09/13/17 AUTH TO DISCUSSJOSE SIVA (HUSB) / to 06/02/19  AUTH TO DISCUSS JOSE SIVA (HUSB) / SONIUA BROGNA (DAUGHTER)

## 2019-06-04 ENCOUNTER — Ambulatory Visit

## 2019-06-04 NOTE — Telephone Encounter (Signed)
 Phone Note -       Initial call taken by: Rosina Lowenstein MD,  Jun 04, 2019 9:49 AM  Initial Details of Call:  please let patient know  her labs including calcium, vitamin D look normal      Follow-up #1  Details: swp above infor relayed to pt   Action: Phone call completed  By: Susan [place] ~ Jun 04, 2019 10:40 AM

## 2019-06-12 ENCOUNTER — Ambulatory Visit

## 2019-09-22 ENCOUNTER — Ambulatory Visit

## 2019-10-01 ENCOUNTER — Ambulatory Visit

## 2019-10-20 ENCOUNTER — Ambulatory Visit

## 2019-10-20 ENCOUNTER — Ambulatory Visit: Admitting: Emergency Medicine

## 2019-10-20 LAB — HX COVID19 BY PCR CIRCLE HEALTH: HX COVID19 RESULT: NOT DETECTED

## 2019-10-21 ENCOUNTER — Ambulatory Visit

## 2019-10-21 NOTE — Telephone Encounter (Signed)
 Phone Note -       Initial call taken by: Rondall Allegra MD,  October 21, 2019 7:02 AM  Initial Details of Call:  let her know that she is covid neg  felt to have virus--rest and fluids  delsym cough syrup      Follow-up #1  Details: called patient left message with husband to have her return call   By: [name] [place] ~ October 21, 2019 1:51 PM    Details: Patient returing call for Misty Stanley, please follow up with patient.    CB [phone]  By: Leticia Clas (Patient Access Center) ~ October 21, 2019 2:58 PM    Follow-up #2  Details: Return call to patient   she is feeling better UC gave her something for her cough she is doing better   [facility] thank you   By: [name] [place] ~ October 21, 2019 3:07 PM

## 2019-12-26 ENCOUNTER — Ambulatory Visit

## 2019-12-30 ENCOUNTER — Ambulatory Visit

## 2020-01-06 ENCOUNTER — Ambulatory Visit

## 2020-01-06 NOTE — Progress Notes (Signed)
 Primary Provider:  [name] MD      History of Present Illness:  [name] is a 64 year old Female who presents today for: Complex Exam  Is there an updated release of information to speak for the patient on file ? yes  Specialists seen since last visit?MEE eye injections Left eye  Has previsit planning and a huddle been performed on this patient? yes ..................................................................Marland KitchenArlana Rivers [place]  January 06, 2020 8:47 AM   Sick 3 weeks ago.    This note is generated with a voice-activated system, transcription errors can occur.  64 year old woman here today for review of complex issues.  Followed by Dr. Pricilla Rivers endocrine for osteoporosis.  On alendronate.  Bone density up-to-date 03/03/19 spine -3.0/hip -1.9.  Vitamin D 33 and 05/2019  Gastroesophageal reflux omeprazole twice weekly.  B12 and magnesium levels normal in 2015.  General mild anxiety.  Situational.  On no medications.  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  Take with food  ALENDRONATE SODIUM 70 MG TABS: TAKE 1 TAB BY MOUTH ONCE A WEEK WITH FULL GLASS WATER AND NO LYING DOWN FOR 30 MINS AFTER  Omeprazole 20 mg capsule,delayed release(DR/EC): 1 capsule by mouth once a day as needed     Current Allergies- Reviewed during today's visit  PCN (Critical)  SULFA (Critical)  * TETANUS (Critical)  * FLU VACCINATION (Critical)  * SEASONAL (Mild)    Past Medical History  No hx of mental illness      Specialists  Dr [name]  vision works  Dr [name], Dr [name] for specialist  Dr [name] GI    Surgical History  none    Family History  FH of Bladder Cancer: FATHER d 48  fa estranged from her mother at a young age  Mo alive 67 arthritis in [place] falls breathing probs  bro d lymphoma  - no breast or ovarian cancer    No FH of mental illness/substance abuse       Social History  Born in [place], emigrated at age 89.  Marital Status:  married--her husb at [employer]  Children:  4  Lives With:  husb, grandson, 2 youngest children.  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      Risk Factors  Tobacco User: no  Smoking Status: never smoked  Passive smoke exposure: No    Drug use: no  Alcohol use: no  HIV high risk behavior: no    Exercise: No  Times per week: no  Exercise Comments:  "when I can"    Caffeine (drinks/day): 1  Sun exposure: rarely  Seatbelt use (%): 100  Bladder Control  issues: no  Safety concerns: no  Falls Information:  Past year - no    Additional Data Recorded today    Eye Exam:  Done  on   12/29/2019      Review of Systems     Gen:  normal energy, sleep  Eye: No changes in vision--right is better--left is still needing observation  ENT: No changes in hearing  Card: No chest pain, pressure, , palpitations, syncope. some shortness of breath.  Pulm: No cough, sputum production, pain with inspiration--walks 40 mins 1 x a day  GI:  No dyspepsia, abdominal pain, change in bowel habits, blood in stool.  heartburn if not tking omepraz daily  GU: No hesitancy, nocturia, incontinence.  Musc-Skel:  no spine /jt/musc pain  Neuro:  No headaches, dizziness, memory loss, falls.  Psych: No anxiety or depression--picks up grandchild daily  Vital Signs     Patient: 65 Years Old Female  Height:  57 in.  Weight: 133 lbs        BMI:  28.88        28.88 on 06/02/2019  BP:  138/88 right arm, normal cuff, seated     134/78 on 06/02/2019   Temp:  97.2  F      Pulse:  76         Pulse Ox: 98 %    Patient is not experiencing pain  Are you having trouble paying for your medications?  No    Medications and Allergies Reviewed    Signed: Arlana Rivers [place].Marland KitchenMarland KitchenMarland KitchenDecember 28, 2021 8:47 AM    PHQ 2    Over the last 2 weeks, how often have you been bothered by any of the following problems?  1. Little interest or pleasure in doing things:  0   - Not at all  2. Feeling down, depressed, or hopeless:  0   - Not at all          Physical Exam    GENERALl:: WD/WN female, in no acute distress  EYE: PERRLA , no conjunctiva abnormality  ENT: EAC normal, TMs negative.  No sinus tenderness.  Pharynx wearing mask.  NECK: Normal thyroid, no nodules, no adenopathy.  CHEST WALLl: No tenderness, no flank tenderness.   BREASTS: No masses/tender/dimpling  LUNGS: Clear throughout.  No adventitious sounds, moves air well.  COR: S1, S2 normal.  Regular rhythm, no murmurs.  ABDOMEN: Soft, nontender, intact bowel sounds. No hepatosplenomegaly.    Vascular:, carotid, pedal pulses intact.  No carotid  bruits.  Extremities: No edema.  No venous stasis  change  NEURO oriented, alert, no focal motor deficits  dtr's intact gait intact  Psych  pleasant appropriate, normal affect-no evidence of depression or anxiety.           Assessment and Plan:  follow-up visit 6 months Medicare wellness  advised:  Get booster shot for the COVID vaccine at the pharmacy--may need to go online to make the appointment  Get pneumonia shot Pneumovax PPSV 23 at your pharmacy  Mammogram: MGSCRTOMO on 10/01/2019    on .  Pap: SOUT on 12/21/2016.  Bone Density: Complete on 03/03/2019.  Colonoscopy: Complete on 09/21/2017.  Above represents pre-visit preparations for this patient.  1. DYSPEPSIA (K30)  Dyspepsia a be slightly worse.  Now requiring daily omeprazole.  Check B12 and magnesium levels, were normal in 2015.    Continue taking once a day multiple vitamins    2. ELEVATED BLOOD PRESSURE READING WITHOUT DIAGNOSIS OF HYPERTENSION (R03.0)  borderline blood pressure.  Watch out for your caffeine and for your salt intake.  Continue walking on a regular basis    3. ADENOMATOUS COLONIC POLYP (D12.6)  due for colonoscopy in 2022    4. OSTEOPOROSIS (M81.0)  ongoing CARE through Dr.Thakkar--continues on alendronate, vitamin D level at goal    5. VISION DISORDER (H53.9)  [facility]--injections monthly to both eyes        Orders:   Added new Service order of Patient Encounter ([id number]) - Signed  Added new Service order of Est Patient Visit - Level 2 (CLI1^EST2) - Signed  Added new Test order of B12 -Vitamin B12 (B12) - Signed  Added new Test order of MG -Magnesium (MAG) - Signed  Added new Service order of Est Patient Visit - Level IV ([id number]) - Signed    Patient Instructions      follow-up visit 6 months Medicare wellness  advised:  Get booster shot for the COVID vaccine at the pharmacy--may need to go online to make the appointment  Get pneumonia shot Pneumovax PPSV 23 at your pharmacy  Mammogram: MGSCRTOMO on 10/01/2019    on .  Pap: SOUT on  12/21/2016.  Bone Density: Complete on 03/03/2019.  Colonoscopy: Complete on 09/21/2017.  Above represents pre-visit preparations for this patient.  1. DYSPEPSIA (K30)  Dyspepsia a be slightly worse.  Now requiring daily omeprazole.  Check B12 and magnesium levels, were normal in 2015.    Continue taking once a day multiple vitamins    2. ELEVATED BLOOD PRESSURE READING WITHOUT DIAGNOSIS OF HYPERTENSION (R03.0)  borderline blood pressure.  Watch out for your caffeine and for your salt intake.  Continue walking on a regular basis    3. ADENOMATOUS COLONIC POLYP (D12.6)  due for colonoscopy in 2022    4. OSTEOPOROSIS (M81.0)  ongoing CARE through Dr.Thakkar--continues on alendronate, vitamin D level at goal    5. VISION DISORDER (H53.9)  [facility]--injections monthly to both eyes              ]

## 2020-03-09 ENCOUNTER — Ambulatory Visit

## 2020-03-09 ENCOUNTER — Ambulatory Visit: Admitting: Internal Medicine

## 2020-03-09 LAB — HX BASIC METABOLIC PANEL
HX ANION GAP: 5 (ref 3.0–11.0)
HX BICARBONATE: 27 mmol/L (ref 21.0–32.0)
HX BUN: 15 mg/dL (ref 8.0–23.0)
HX CALCIUM: 9 mg/dL (ref 8.5–10.5)
HX CHLORIDE: 106 mmol/L (ref 98.0–110.0)
HX CREATININE: 0.61 mg/dL (ref 0.55–1.3)
HX GLOMERULAR FR AFRICAN AMERICAN: >90
HX GLOMERULAR FR NON AFRICAN AMER: >90
HX GLUCOSE: 99 mg/dL (ref 70.0–110.0)
HX POTASSIUM: 4.1 mmol/L (ref 3.6–5.2)
HX SODIUM: 138 mmol/L (ref 136.0–146.0)

## 2020-03-15 ENCOUNTER — Ambulatory Visit

## 2020-03-15 NOTE — Telephone Encounter (Addendum)
 Phone Note -     Patient    Call back at [facility] [phone]  Initial call taken by: Barron Schmid,  March 15, 2020 9:02 AM  Initial Details of Call:  Confirmed DOS 3/8 @ 945a w/ Dr. Tedra Senegal in [facility].  We are asking you to attest for your own safety and the safety of our staff.    Fever  SOB  Headaches  New loss of smell/taste  Fatigue  New cough(not related to chronic condition)  New nasal congestion or runny nose (not related to seasonal allergies)  Muscle aches  Aches and Pains  Sore throat  Diarrhea  Have you been tested for COVID?  If so, Where (facility) ?  Date?         Covid Result:    Positive / Negative  Have you been vaccinated for COVID?  Anyone in house/contact positive for covid?  When?  no symptoms                  Created By Barron Schmid on 03/15/2020 at 09:02 AM    Electronically Signed By Barron Schmid on 03/15/2020 at 09:03 AM

## 2020-03-16 ENCOUNTER — Ambulatory Visit

## 2020-03-16 ENCOUNTER — Ambulatory Visit: Admitting: Internal Medicine

## 2020-03-16 NOTE — Progress Notes (Addendum)
 ENDOCRINOLOGY ENCOUNTER    PCP: [name] MD  Chief Complaint: Osteoporosis  HPI: [name]    65 F with a history of GERD referred in endocrine consultation for osteoporosis at the request of PCP.   She was on alendronate from 2009 to 03-2013.She has been on drug holiday till 03-2018.  Had been on risedronate before that.     03-2020: no s/e on faosamx,no falls or fractures takes total of 2000 vitamin D,last levels normal  04-2019: on fosamamx again for past 20yr-no s/e,BMD done by PCP on 24yr on fosamax showing improved scores,T score spine -3 ,FN -1.9.She is on  vitamin D 1000 IU daily., no falls/fractures  02-2017: BMD reviewed and worsening scores, spine -3.3, Hip -2.3, We had discussed doing Reclast last year but she was traveling to [place] and did not reschedule her Reclast.  No new fractures or falls.Most recently we discussed doing Prolia but it turns out she does not have drug coverage.  [facility]ax will be covered.,09-2014 BMD with T scores -2.9 in AP spine and -1.7 in left femur.        The patient reports puberty occurred at age 85, similar to peers. Periods were regular.  She is G5P4.  Menopause occurred at age 80.  Was briefly on HRT at that time.    At around age 71 she was diagnosed with osteoporosis.  Her dairy intake is minimal: occasional yogurt, has almond milk every night.     She reports no extended period of prednisone use. She has no history of kidney stones.  She reports no constipation.   No polyuria.    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  Take with food; Route: ORAL  omeprazole 20 mg capsule,delayed release(DR/EC) (omeprazole) 1 capsule by mouth once a day as needed   alendronate 70 mg tablet (alendronate) Take 1 tablet by mouth once a week       Allergies:    PCN, SULFA, * TETANUS, * FLU VACCINATION, * SEASONAL.      Histories:   Past Medical History: No hx of mental illness    Past Surgical History: none  Social History: Born in [place], emigrated at age 48.  Marital Status:  married--her husb at [employer]  Children:  4  Lives With:  husb, grandson, 2 youngest children.  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 No cold intolerance.  No diarrhea or constipation. No change in hair or skin. No dysphagia, no odynophagia, no solid food or pill dysphagia. No diplopia/blurry vision.No fevers/chills.No change in voice.No anterior neck pressure. No increase in neck size.No wheeze or DOE.No rash.No edema. No cough.No anxiety/Depression. All other pertinent systems reviewed and are  negative      Vital Signs   Weight: 132 lb. (60.00 kg.)  (Weight change since last visit: -1 lbs.)    Height: 57 in. (144.78 cm.)  BMI: 28.67    Blood Pressure: 131 /74 mm Hg.  Postion: sitting  Pulse Rate: 73  Comment: right arm    Letha Cape [place]......................March 16, 2020 9:32 AM        Physical Exam:   General: General: appears well,NAD  Vitals reviwed-stable  HEENT: No thyromegaly, EOMI  CVS: no peripheral edema  Resp:norm resp effort  Extremities: No lower extremity edema  Skin: intact, no rash  MS: ambulates well  Neuro: no tremor  Psych: Normal mood and affect.          Laboratory Results:   TSH: 0.80 (05/25/2010 10:17:00 AM)  TSH Ultra: 0.85 (01/15/2017 10:07:00 AM)  Vit D 25 33 NGM/ML (06/02/2019 9:58:00 AM)  ESR 10 (07/17/2012 10:24:00 AM)  Sodium: 138 (03/09/2020 9:05:00 AM)  Potassium: 4.1 (03/09/2020 9:05:00 AM)  Chloride: 106 (03/09/2020 9:05:00 AM)  Bicarbonate 27 (03/09/2020 9:05:00 AM)  BUN: 15 (03/09/2020 9:05:00 AM)  Creatinine: 0.61 (03/09/2020 9:05:00 AM)  Serum Calcium: 9.0 (03/09/2020 9:05:00 AM)  Serum Magnesium: 2.1 (11/11/2013 10:47:00 AM)    Assessment & Plan:  65 F with a history of GERD referred in endocrine consultation for osteoporosis at the request of PCP.   She was on alendronate from 2009 to 03-2013.  Had been on risedronate before that. 09-2014 BMD with T scores -2.9 in AP spine and -1.7 in left femur.02-2017 spine -3.3, Hip -2.3 , PC we did follow bone density in 2021 with just 1 year of Fosamax and was already improving as above She has no history of fracture.  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   She was on alendronate for 6 years.  Her spinal BMD has declined since 2014 but probably not significant?it is still higher that it was compared to 2011 as bisphos worked. Hip BMD now worst.  We discussed options of both Reclast versus Prolia .  She had initially wanted to do Reclast but is very afraid  of side effects and today wanted Prolia.  But it appears that she does not have a drug coverage.  She has been oral Fosamax again since 03-2019, f/u BMD 03-2021, labs next month    In the meantime, discussed goal of 1200 mg daily calcium via diet or pill. Gets vitamin D via citracal-D and MVI. Recent kidney function, calcium and vitamin D     Copy to referring provider.    Thank you for involving me in the care of your patient.    Kind regards,    Rosina Lowenstein, MD    Copy of note sent to referring provider  Note generated  with dictation software and may contain errors        Patient's Instructions:   Disposition: RTc 68yr w/labs, BMD     Medications:  alendronate 70 mg tablet (alendronate) Take 1 tablet by mouth once a week   #13 tablet x 3   Entered and Authorized by: Rosina Lowenstein MD   Signed by: Rosina Lowenstein MD on 03/16/2020   Method used: [facility]        [id number] [address], [place], [place] [address], Ph: [phone] Fax: [phone]     RxID: [id number]

## 2020-03-16 NOTE — Telephone Encounter (Addendum)
 Phone Note -       Initial call taken by: Letha Cape [place],  March 16, 2020 9:36 AM  Initial Details of Call:  We are asking you to attest for your own safety and the safety of our staff.    Fever  SOB  Headaches  New loss of smell/taste  Fatigue  New cough(not related to chronic condition)  New nasal congestion or runny nose (not related to seasonal allergies)  Muscle aches  Aches and Pains  Sore throat  Diarrhea  Have you been tested for COVID?  If so, Where (facility) ?  Date?         Covid Result:    Positive / Negative  Have you been vaccinated for COVID?  Anyone in house/contact positive for covid?  When?  pt answered no to sxs                  Created By Letha Cape [place] on 03/16/2020 at 09:36 AM    Electronically Signed By Letha Cape [place] on 03/16/2020 at 09:36 AM

## 2020-04-06 NOTE — Teleconsult (Signed)
Internal Correspondence   Call back at [facility] [phone]  Initial call taken by: Talmadge Coventry,  May 17, 2017 11:22 AM  Summary of Call: Screening [place]    Patient does not mind gender, would like it in the morning, would perfer it on a Friday.    Thank you,  Toriee

## 2020-04-06 NOTE — Teleconsult (Signed)
Phone Note -       Initial call taken by: Rosina Lowenstein MD,  May 14, 2017 7:39 AM  Initial Details of Call:  what is the status of her recalst? requested tos et up in feb 2019 per my note      Follow-up #1  Details: will call bc. never got a response. Faxed twice  By: Letha Cape [place] ~ May 14, 2017 8:14 AM

## 2020-04-06 NOTE — Teleconsult (Signed)
Phone Note -       Initial call taken by: Rosina Lowenstein MD,  January 14, 2016 1:18 PM  Initial Details of Call:  please let patient know  labs look fine  no need to restart osteop medication for now      Follow-up #1  Details: SPOKE WITH PT  By: Letha Cape [place] ~ January 17, 2016 7:55 AM

## 2020-04-06 NOTE — Progress Notes (Signed)
[facility] - [name], MD   [address]   [place], [place] [address]  Office: [phone] Fax: [phone]          05/16/2015    [name]  [address]  [place], [place]  [address]    Dear [name]:    The x-ray of the left shoulder shows signs of rotator cuff issues and possible nerve impingement.  Please follow up with orthopedics if you are not improving on the ibuprofen.  I have given you the name of the specialist.            Sincerely,          [name] MD

## 2020-04-06 NOTE — Teleconsult (Signed)
Phone Note -     Pharmacy    **Refill Request Only**    Initial call taken by: Kathaleen Grinder,  October 22, 2015 9:56 AM  Initial Details of Call:  fax request- omeprazole 20mg  1xday #[facility] [address] [place] [phone]      Follow-up #1  Details: Last Appointment-05-13-2015  Next Appointment-12-16-2015  Last Refill-05-13-2015     #30 x3  Pharmacy is correct-yes    By: Dorthey Sawyer RN ~ October 22, 2015 10:20 AM    Prescriptions:  OMEPRAZOLE 20 MG CPDR (OMEPRAZOLE) 1 by mouth daily  #30[Capsule] x 2   Entered by: Dorthey Sawyer RN   Authorized by: Sharin Mons MD   Signed by: Sharin Mons MD on 10/22/2015   Method used: Electronically to      [facility] (retail)     [address] [place], [place]  [id number]     Ph: [phone]     Fax: [phone]   RxID: [id number]        Medications:  Rx of OMEPRAZOLE 20 MG CPDR (OMEPRAZOLE) 1 by mouth daily;  #30[Capsule] x 2;  Signed;  Entered by: Dorthey Sawyer RN;  Authorized by: Sharin Mons MD;  Method used: Electronically to [facility] [address], [place], [place]  [id number], Ph: [phone], Fax: [phone]

## 2020-04-06 NOTE — Teleconsult (Signed)
Phone Note -       Initial call taken by: [name] MD,  October 24, 2017 5:40 PM  Initial Details of Call:  seen for severe headache in urg care then ER  given tramadol 25  needs follow up call  CT B neg      Follow-up #1  Details: Call to patient in follow up  a little better with tramadol. ER advised to follow up with neurology. she is going to make appointment and call referral line to get referral.    Advisesd to call office if her sx change or get worse     By: [name] [place] ~ October 25, 2017 9:23 AM

## 2020-04-06 NOTE — Teleconsult (Signed)
Phone Note -       Follow up Call:: Post ER  Initial call taken by: [name] MD,  June 22, 2017 10:57 PM  Initial Details of Call:  follow up call  allergic rhinitis in urg care      Follow-up #1  Details: Spoke to [facility], she is cont to take her OTC allergy medication and motrin, she has f/u with dr Parke Poisson 6/27 she thanked me for checking in  By: Esau Grew ~ June 25, 2017 9:21 AM

## 2020-04-06 NOTE — Progress Notes (Signed)
[facility] - [name], MD   [address]   [place], [place] [address]  Office: [phone] Fax: [phone]          05/13/2015    [name]  [address]  [place], [place]  [address]    Dear [name]:    The x-rays of the left shoulder showed no fracture.  There is signs of impingement and/or rotator cuff abnormalities.  If the ibuprofen does not help please follow up with orthopedics.            Sincerely,          [name] MD

## 2020-04-06 NOTE — Teleconsult (Signed)
Phone Note -       Initial call taken by: Rosina Lowenstein MD,  January 23, 2017 11:00 AM  Initial Details of Call:  please let patient know  labs look good  contnue drug holiday      Follow-up #1  Action: Left Message for Patient  By: Letha Cape [place] ~ January 23, 2017 2:45 PM    Action: Left Message for Patient  By: Letha Cape [place] ~ January 24, 2017 7:58 AM    Follow-up #2  Details: spoke with pt  By: Letha Cape [place] ~ January 24, 2017 8:00 AM

## 2020-04-06 NOTE — Progress Notes (Signed)
[facility] - [name], MD   [address]   [place], [place] [address]  Office: [phone] Fax: [phone]        December 25, 2016        [name]  [address]  [place], [place]  [address]    Dear [name]      Your pap smear is normal.       Sincerely,        [name] MD

## 2020-04-06 NOTE — Progress Notes (Signed)
[facility] - [name], MD   [address]   [place], [place] [address]  Office: [phone] Fax: [phone]  December 30, 2017      Trenda Corliss  [address]  [place], [place]  [address]    Dear Byrd Hesselbach,    I have received the results of your most recent labwork. The results are listed below:     Labs Your Value Normal Result Date   Total Cholesterol: 252 Goal: less than 200 12/27/2017   HDL (good cholesterol):  100 Normal : >40 12/27/2017   LDL (bad cholesterol):  137 Goal: less than 130 12/27/2017   Triglycerides: 73 Goal: less than 200 12/27/2017     Blood sugar 106 Normal : 70-100 12/27/2017   Creatinine (kidney function) 0.65 Normal: 0.55  1.3   12/27/2017   ALT (liver test) 47 Normal ALT 6-55   12/27/2017   AST (liver test) 27  Normal AST 6-40 12/27/2017   Hematocrit   (blood count) 46.4 Normal female: 62- 34  Normal female: 40-52 12/27/2017    NORMAL lipase and amylase, pancreatic enzymes  If you are not improving on the omeprazole, consider either evaluation with GI doctor, or CT scan of the abdomen       Sincerely,        [name] MD

## 2020-04-06 NOTE — Teleconsult (Signed)
----   Converted from flag ----  ---- 02/14/2017 8:16 PM, [name] MD wrote:  do you mind to  address the results of the bone density of 02/12/2017 with [name]  slight deterioration   thanks, paula  ------------------------------  Phone Note -       Initial call taken by: Rosina Lowenstein MD,  February 15, 2017 9:39 AM  Initial Details of Call:  please let patient know  BMD results show worsening   prorbably advisable to start Rx  should come in to discuss diff options      Follow-up #1  Details: Appt schedule 02/22/17.  Action: Patient Notified  By: Milinda Hirschfeld [place] ~ February 15, 2017 10:20 AM

## 2020-05-03 NOTE — Telephone Encounter (Signed)
Patient said she was a Patient of [name]. Unable to find her profile in EMR or in Epic  [place] Patient appointment scheduled with:12/14 @9 :20am  w/ [name]   Patient's last PCP (name): [name]  Location of last PCP (phone number & address):   Date of Last Physical Exam: 12/2019  Date of Last Medicare Wellness (if over 65):   Is there anyone you would authorize to verbally speak for you (make appts, get lab results, etc)?   (if yes, list person(s) & notify patient signed release needed)   Pharmacy name & city (add to Medication Management section):  [facility] [address] [place] ?PH#: [phone] Fax#: [phone]     Instruct Patient: Office no longer accepts Cash as a form of payment. Form of Payments accepted is Debit or Credit.  Please bring a Photo ID, Insurance Card, and Current medication bottles.   Please arrive 15-20 minutes prior to appointment due to paperwork needing to be filled out.    For Office: Runner, broadcasting/film/video authorization (does not need signature from patient) to the above former PCP.  Unable to verify insurance for:  [employer] ID #: [id number]  Insurance name: Medicare A & B  Phone number on back of insurance card: [phone]  Address on insurance card to send medical claims to:  Patient unable to locate number she only sees a fax.

## 2020-06-08 ENCOUNTER — Encounter (HOSPITAL_BASED_OUTPATIENT_CLINIC_OR_DEPARTMENT_OTHER)

## 2020-07-05 ENCOUNTER — Other Ambulatory Visit (HOSPITAL_BASED_OUTPATIENT_CLINIC_OR_DEPARTMENT_OTHER)

## 2020-07-05 NOTE — Telephone Encounter (Signed)
Patient needs to schedule and appointment       Appointment:        Date of patient's next encounter in the current department:  Visit date not found   Date of patient's next encounter with the current provider:  Visit date not found   Date of patient's last encounter with the current provider: @Lastencthisprov @     Last BP:   BP Readings from Last 3 Encounters:   03/16/20 131/74   01/06/20 138/88   06/02/19 134/78       Last HGBA1C: No results found for: HGBA1C    Labs  No components found for: CREAT  No results found for: AST  No results found for: ALT  No components found for: TBILI  No results found for: HGB  No results found for: HCT  No results found for: PLT  No results found for: PTT  No results found for: INR  No results found for: PROTIME  No results found for: PTADJUSTED    Last Urine Drug Screen: on .  Narcotic Medications:    Directives/Controlled Substance Agreement   PMP Appropriate __YES      ___No

## 2020-07-07 ENCOUNTER — Other Ambulatory Visit (HOSPITAL_BASED_OUTPATIENT_CLINIC_OR_DEPARTMENT_OTHER): Admitting: Internal Medicine

## 2020-07-07 MED ORDER — omeprazole (PriLOSEC) 20 mg DR capsule
20 | ORAL_CAPSULE | Freq: Every day | ORAL | 0 refills | 60.00000 days | Status: DC | PRN
Start: 2020-07-07 — End: 2020-10-04

## 2020-07-07 NOTE — Telephone Encounter (Signed)
 Appointment:  Last fill 12/31/19 #90 with 1 refill     Date of patient's next encounter in the current department:  12/30/2020   Date of patient's next encounter with the current provider:  Visit date not found   Date of patient's last encounter with the current provider: @Lastencthisprov @     Last BP:   BP Readings from Last 3 Encounters:   03/16/20 131/74   01/06/20 138/88   06/02/19 134/78       Last HGBA1C: No results found for: HGBA1C    Labs  No components found for: CREAT  No results found for: AST  No results found for: ALT  No components found for: TBILI  No results found for: HGB  No results found for: HCT  No results found for: PLT  No results found for: PTT  No results found for: INR  No results found for: PROTIME  No results found for: PTADJUSTED    Last Urine Drug Screen: on .  Narcotic Medications:    Directives/Controlled Substance Agreement   PMP Appropriate __YES      ___No

## 2020-07-08 ENCOUNTER — Telehealth (HOSPITAL_BASED_OUTPATIENT_CLINIC_OR_DEPARTMENT_OTHER): Admitting: Internal Medicine

## 2020-07-08 NOTE — Telephone Encounter (Signed)
 Patient calling in regards to receiving a notification from her pharmacy that the medication request for Omeprazole had not been approved. Informed the patient that prescription refill was sent 07/07/20. Patient will confirm with pharmacy.    Patient called to request a refill of     Omeprazole 20 mg    [facility] [address] [place] [place]    Informed patient it can take 3 business days to send prescription refill to pharmacy.    Verified insurance in [place]      Callback# [phone]

## 2020-07-08 NOTE — Telephone Encounter (Signed)
 Patient got the text yesterday in the morning. The refill wasn't sent until 2pm. Patient will call the pharmacy. If they did not receive it she will call us back.

## 2020-09-15 ENCOUNTER — Encounter (INDEPENDENT_AMBULATORY_CARE_PROVIDER_SITE_OTHER): Admitting: Internal Medicine

## 2020-09-15 ENCOUNTER — Telehealth (INDEPENDENT_AMBULATORY_CARE_PROVIDER_SITE_OTHER): Admitting: Internal Medicine

## 2020-09-20 ENCOUNTER — Telehealth (INDEPENDENT_AMBULATORY_CARE_PROVIDER_SITE_OTHER): Admitting: Family Medicine

## 2020-09-20 ENCOUNTER — Other Ambulatory Visit (INDEPENDENT_AMBULATORY_CARE_PROVIDER_SITE_OTHER)

## 2020-09-20 ENCOUNTER — Other Ambulatory Visit (HOSPITAL_BASED_OUTPATIENT_CLINIC_OR_DEPARTMENT_OTHER)

## 2020-09-20 ENCOUNTER — Ambulatory Visit: Attending: Family | Admitting: Family

## 2020-09-20 ENCOUNTER — Ambulatory Visit: Admit: 2020-09-20 | Discharge: 2020-09-20 | Payer: MEDICARE | Attending: Family | Primary: Family Medicine

## 2020-09-20 ENCOUNTER — Encounter: Payer: MEDICARE | Attending: Family | Primary: Family

## 2020-09-20 ENCOUNTER — Other Ambulatory Visit

## 2020-09-20 VITALS — BP 160/89 | HR 79 | Temp 97.6°F | Ht <= 58 in | Wt 131.0 lb

## 2020-09-20 DIAGNOSIS — R030 Elevated blood-pressure reading, without diagnosis of hypertension: Secondary | ICD-10-CM

## 2020-09-20 LAB — CBC
Hematocrit: 42.5 % (ref 32.0–47.0)
Hemoglobin: 14.1 g/dL (ref 11.0–16.0)
MCH: 30.1 pg (ref 26.0–34.0)
MCHC: 33.2 g/dL (ref 31.0–37.0)
MCV: 90.6 fL (ref 80.0–100.0)
MPV: 10.9 fL (ref 9.1–12.4)
NRBC %: 0 % (ref 0.0–0.0)
NRBC Absolute: 0 10*3/uL (ref 0.00–2.00)
Platelets: 242 10*3/uL (ref 150–400)
RBC: 4.69 M/uL (ref 3.70–5.20)
RDW-CV: 13.1 % (ref 11.5–14.5)
RDW-SD: 43.1 fL (ref 35.0–51.0)
WBC: 6.4 10*3/uL (ref 4.0–11.0)

## 2020-09-20 LAB — COMPREHENSIVE METABOLIC PANEL
ALT: 58 U/L — ABNORMAL HIGH (ref 0–55)
AST: 28 U/L (ref 6–42)
Albumin: 3.9 g/dL (ref 3.2–5.0)
Alkaline phosphatase: 62 U/L (ref 30–130)
Anion Gap: 5 mmol/L (ref 3–14)
BUN: 11 mg/dL (ref 6–24)
Bilirubin, total: 0.3 mg/dL (ref 0.2–1.2)
CO2 (Bicarbonate): 30 mmol/L (ref 20–32)
Calcium: 9.4 mg/dL (ref 8.5–10.5)
Chloride: 106 mmol/L (ref 98–110)
Creatinine: 0.73 mg/dL (ref 0.55–1.30)
Glucose: 117 mg/dL (ref 70–139)
Potassium: 4 mmol/L (ref 3.6–5.2)
Protein, total: 7.2 g/dL (ref 6.0–8.4)
Sodium: 141 mmol/L (ref 135–146)
eGFRcr: 90 mL/min/{1.73_m2} (ref >=60)

## 2020-09-20 MED ORDER — amLODIPine (Norvasc) 5 mg tablet
5 | ORAL_TABLET | Freq: Every day | ORAL | 5 refills | Status: DC
Start: 2020-09-20 — End: 2020-09-21

## 2020-09-20 NOTE — Telephone Encounter (Signed)
 Patient is calling in stating that she will be a new patient of Dr. Jiles Garter in December. Patient states she has not met Dr. Jiles Garter Patient states she has had a headache for about 3 weeks. Patient states she has pain on the crown of her head. Please call back and assist, call back#[phone]

## 2020-09-20 NOTE — Patient Instructions (Signed)
Blood pressure was elevated today. I want to start blood pressure medication in the morning.  Check blood pressure tonight and first tomorrow morning.  If top number is about 140 -160, take take the medication.  If top number is 110 -138 do not take medication. Call me     -Excedrin migraine tonight.  Drink fluids     -return to clinic in 2 weeks

## 2020-09-20 NOTE — Telephone Encounter (Signed)
 Patient is calling back a she is a former Dr Parke Poisson patient. Informed patient that the nursing staff will give her a call back asap    Call back [phone]

## 2020-09-20 NOTE — Telephone Encounter (Signed)
 I spoke to patient and she thought she had an appointment with Dr. Jiles Garter is December. I do not see that one is booked. Patient coming in today at 4:20pm to see Sam NP for headaches for 3 weeks. Patient will change her PCP to Dr. Benjiman Core.

## 2020-09-20 NOTE — Progress Notes (Signed)
[facility] [place]  [address] [place] [address]  [phone]    Patient ID: [name] is a 65 y.o. female who presents for acute visit and Headache (X 3 weeks).    Subjective       Patient is establishing care, former patient of Dr. Parke Poisson.  Last appt was December 2021.      Patient reporting headaches for 3 weeks, worse in the right ear. She was seen in UC without significant findings.  She was treated for possible sinusitis with antibiotics.  Patient states the headache pattern has changed where the headache has changed from right side to feels its at the crown of her headache and frontal sinus.  Pt states that headache is "annoying" where she stays up at night.  She is aware of the headache but not the worse headache of her life.   The zpak did not a difference.   No vision changes but reporting som nausea.  No vision changes.        Patient Active Problem List   Diagnosis   . Osteoporosis   . Osteoarthritis   . Benign neoplasm of colon, unspecified   . Elevated blood pressure reading without diagnosis of hypertension   . Vision disorder   . Functional dyspepsia     Current Outpatient Medications   Medication Instructions   . alendronate (FOSAMAX) 70 mg, oral, Weekly   . cal-D3-mag11-zinc-cop-mang-bor 600 mg calcium- 800 unit-50 mg tablet 1 tablet, oral, Daily RT   . lutein 6 mg, oral   . methocarbamol (Robaxin) 500 mg tablet Take 1 tablet in the morning and 1 tablet at night with 400 mg of ibuprofen for 5 days May cause dizziness   . multivitamin (Multi-Delyn) liquid oral solution 5 mL, oral, Daily RT   . omeprazole (PRILOSEC) 20 mg, oral, Daily PRN     Allergies   Allergen Reactions   . Influenza Vaccine Tr-S 11 (Pf)    . Sulfur    . Penicillins Hives and Rash     No past medical history on file.  No past surgical history on file.                  Objective   Visit Vitals  BP (!) 160/89 (BP Location: Left arm, Patient Position: Sitting, BP Cuff Size:  Adult)   Pulse 79   Temp 36.4 C (97.6 F) (Temporal)   Ht 1.435 m   Wt 59.4 kg   SpO2 97%   BMI 28.85 kg/m   BSA 1.54 m         Review of Systems   All other systems reviewed and are negative.      Physical Exam  Vitals reviewed.   Constitutional:       Appearance: Normal appearance.   HENT:      Head: Normocephalic.      Nose: Nose normal.      Mouth/Throat:      Mouth: Mucous membranes are moist.   Eyes:      Extraocular Movements: Extraocular movements intact.      Pupils: Pupils are equal, round, and reactive to light.   Cardiovascular:      Rate and Rhythm: Normal rate and regular rhythm.      Heart sounds: Normal heart sounds.   Pulmonary:      Effort: Pulmonary effort is normal.      Breath sounds: Normal breath sounds.   Abdominal:  General: Abdomen is flat. Bowel sounds are normal.      Palpations: Abdomen is soft.   Musculoskeletal:         General: Normal range of motion.      Cervical back: Full passive range of motion without pain, normal range of motion and neck supple.      Right lower leg: No edema.      Left lower leg: No edema.   Skin:     General: Skin is warm and dry.   Neurological:      Mental Status: She is alert.   Psychiatric:         Attention and Perception: Attention normal.         Mood and Affect: Mood normal.         Speech: Speech normal.         Behavior: Behavior normal.         Assessment/Plan   Problem List Items Addressed This Visit           ICD-10-CM    Osteoporosis M81.0    Relevant Medications    cal-D3-mag11-zinc-cop-mang-bor 600 mg calcium- 800 unit-50 mg tablet    Elevated blood pressure reading without diagnosis of hypertension R03.0    Current Assessment & Plan     Possible elevation due to pain  Patient will monitor blood pressure at home  Will not start amlodipine for blood pressure for persistent BP at 140/90           Other Issues Addressed       Codes    Elevated blood pressure reading    -  Primary R03.0    Relevant Orders    CBC (Completed)    Comprehensive  metabolic panel (Completed)    Intractable persistent migraine aura without cerebral infarction and with status migrainosus     G43.511    Will obtain MRI   will start Flexeril        Relevant Medications    methocarbamol (Robaxin) 500 mg tablet    Other Relevant Orders    MR BRAIN WO CONTRAST (Completed)

## 2020-09-21 ENCOUNTER — Telehealth (INDEPENDENT_AMBULATORY_CARE_PROVIDER_SITE_OTHER): Admitting: Family

## 2020-09-21 MED ORDER — methocarbamol (Robaxin) 500 mg tablet
500 | ORAL_TABLET | ORAL | 0 refills | 10.00000 days | Status: DC
Start: 2020-09-21 — End: 2020-09-21

## 2020-09-21 MED ORDER — cal-D3-mag11-zinc-cop-mang-bor 600 mg calcium- 800 unit-50 mg tablet
600 | Freq: Every day | ORAL | 0 refills | Status: AC
Start: 2020-09-21 — End: ?

## 2020-09-21 MED ORDER — methocarbamol (Robaxin) 500 mg tablet
500 | ORAL_TABLET | ORAL | 0 refills | 10.00000 days | Status: DC
Start: 2020-09-21 — End: 2021-05-02

## 2020-09-21 NOTE — Telephone Encounter (Signed)
 Patient states she seen Np Pierre 9-13  and blood pressure was to be checked today and top numbers 114 second time 120 and still have headache.    I transferred patient to the office no one  One picked.    I verified insurance with patient.    Call back#[phone]

## 2020-09-21 NOTE — Telephone Encounter (Addendum)
Tell patient to hold off on blood pressure medication.  I ordered brain MRI.  I can have [name] schedule.  I sent robaxin to her pharmacy take 1 tablet with 400 mg of Ibuprofen ( morning and evening) for 5 days.  Cont to monitor blood pressures. Make f/u with me in 2 weeks bring BP machine to visit

## 2020-09-22 NOTE — Telephone Encounter (Signed)
 MRI Booked for Sept 20,2022 at [address] [place] @ 5:30 pm arrival  6:00 pm MRI. [name] was in building she was handed the orders with date and time.

## 2020-09-28 ENCOUNTER — Other Ambulatory Visit

## 2020-09-28 ENCOUNTER — Inpatient Hospital Stay: Admit: 2020-09-28 | Payer: MEDICARE | Primary: Family

## 2020-09-28 ENCOUNTER — Encounter

## 2020-09-28 DIAGNOSIS — G43511 Persistent migraine aura without cerebral infarction, intractable, with status migrainosus: Secondary | ICD-10-CM

## 2020-09-29 ENCOUNTER — Other Ambulatory Visit

## 2020-09-29 ENCOUNTER — Encounter (INDEPENDENT_AMBULATORY_CARE_PROVIDER_SITE_OTHER): Admitting: Family

## 2020-09-30 NOTE — Telephone Encounter (Addendum)
 Still has the headaches but not as bad, f/u with Maggie 10/10.  Will bring BP cuff, BP readings are ok she states.    She said she saw the brain MRI results:    "IMPRESSION:  No source of headaches is identified without evidence of a brain mass, hydrocephalus, or extra-axial fluid collection."

## 2020-10-03 ENCOUNTER — Other Ambulatory Visit (HOSPITAL_BASED_OUTPATIENT_CLINIC_OR_DEPARTMENT_OTHER): Admitting: Internal Medicine

## 2020-10-04 NOTE — Telephone Encounter (Signed)
 Last refill 07/07/20    Appointment:        Date of patient's next encounter in the current department:  Visit date not found   Date of patient's next encounter with the current provider:  Visit date not found   Date of patient's last encounter with the current provider: @Lastencthisprov @     Last BP:   BP Readings from Last 3 Encounters:   09/20/20 (!) 160/89   03/16/20 131/74   01/06/20 138/88       Last HGBA1C: No results found for: HGBA1C    Labs  No components found for: CREAT  AST   Date Value Ref Range Status   09/20/2020 28 6 - 42 U/L Final     ALT   Date Value Ref Range Status   09/20/2020 58 (H) 0 - 55 U/L Final     No components found for: TBILI  Hemoglobin   Date Value Ref Range Status   09/20/2020 14.1 11.0 - 16.0 g/dL Final     Hematocrit   Date Value Ref Range Status   09/20/2020 42.5 32.0 - 47.0 % Final     Platelets   Date Value Ref Range Status   09/20/2020 242 150 - 400 K/uL Final     No results found for: PTT  No results found for: INR  No results found for: PROTIME  No results found for: PTADJUSTED    Last Urine Drug Screen: on .  Narcotic Medications:    Directives/Controlled Substance Agreement   PMP Appropriate __YES      ___No

## 2020-10-06 ENCOUNTER — Ambulatory Visit: Payer: MEDICARE | Primary: Family

## 2020-10-06 ENCOUNTER — Encounter (HOSPITAL_BASED_OUTPATIENT_CLINIC_OR_DEPARTMENT_OTHER)

## 2020-10-06 ENCOUNTER — Other Ambulatory Visit

## 2020-10-06 ENCOUNTER — Ambulatory Visit (HOSPITAL_BASED_OUTPATIENT_CLINIC_OR_DEPARTMENT_OTHER)

## 2020-10-06 DIAGNOSIS — Z1231 Encounter for screening mammogram for malignant neoplasm of breast: Secondary | ICD-10-CM

## 2020-10-10 NOTE — Assessment & Plan Note (Signed)
Possible elevation due to pain  Patient will monitor blood pressure at home  Will not start amlodipine for blood pressure for persistent BP at 140/90

## 2020-10-11 ENCOUNTER — Other Ambulatory Visit

## 2020-10-18 ENCOUNTER — Ambulatory Visit: Admit: 2020-10-18 | Discharge: 2020-10-18 | Payer: MEDICARE | Attending: Family | Primary: Family Medicine

## 2020-10-18 ENCOUNTER — Other Ambulatory Visit

## 2020-10-18 VITALS — BP 134/72 | HR 75 | Temp 97.7°F | Ht <= 58 in | Wt 129.0 lb

## 2020-10-18 DIAGNOSIS — G44221 Chronic tension-type headache, intractable: Secondary | ICD-10-CM

## 2020-10-18 MED ORDER — amitriptyline (Elavil) 10 mg tablet
10 | ORAL_TABLET | ORAL | 11 refills | Status: DC
Start: 2020-10-18 — End: 2021-05-02

## 2020-10-18 MED ORDER — loratadine (Claritin) 10 mg tablet
10 | ORAL_TABLET | Freq: Every day | ORAL | 2 refills | Status: AC
Start: 2020-10-18 — End: 2021-01-16

## 2020-10-18 NOTE — Progress Notes (Signed)
[facility] [place]  [address] [place] [address]  [phone]    Patient ID: [name] is a 65 y.o. female who presents for Follow-up (2 weeks).    Subjective   Headache: Patient complains of headache. She does have a headache at this time.  Patient seen in clinic about 1 month of ago for similar presentation and treated with flexeril and prednisone patient reporting mild improving.  However, H/A cont to be persistent nagging pain.  Pt states  Headache have never gone away.  It just moved from crown of head to frontal lope   Patient  Prior to that visit, she was also seen in UC, treated for sinusitis without improving.    Description of Headaches:  Location of pain: upper face and crown of head  Radiation of pain?:none  Character of pain:throbbing  Severity of pain: 10  Accompanying symptoms: nausea  Prodromal sx?: nausea  Rapidity of onset: unknown      Current Use of Meds to Treat HA:  Abortive meds? acetaminophen  Daily use? no  Prophylactic meds? none    Additional Relevant History:  History of head/neck trauma? no  History of head/neck surgery? no  Family h/o headache problems? no  Use of meds that might worsen HAs? no  Exposure to carbon monoxide? yes         Patient Active Problem List   Diagnosis   . Osteoporosis   . Osteoarthritis   . Benign neoplasm of colon, unspecified   . Elevated blood pressure reading without diagnosis of hypertension   . Vision disorder   . Functional dyspepsia   . Vitamin D deficiency   . Chronic tension headache     Current Outpatient Medications   Medication Instructions   . alendronate (FOSAMAX) 70 mg, oral, Weekly   . amitriptyline (Elavil) 10 mg tablet Please take 1/2 tablet for 1 week, then 1 tablet at night   . cal-D3-mag11-zinc-cop-mang-bor 600 mg calcium- 800 unit-50 mg tablet 1 tablet, oral, Daily RT   . loratadine (CLARITIN) 10 mg, oral, Daily   . lutein 6 mg, oral   . methocarbamol (Robaxin) 500 mg tablet Take 1  tablet in the morning and 1 tablet at night with 400 mg of ibuprofen for 5 days May cause dizziness   . multivitamin (Multi-Delyn) liquid oral solution 5 mL, oral, Daily RT   . omeprazole (PRILOSEC) 20 mg, oral, Daily PRN     Allergies   Allergen Reactions   . Influenza Vaccine Tr-S 11 (Pf)    . Sulfur    . Penicillins Hives and Rash     No past medical history on file.                  Objective   Visit Vitals  BP 134/72 (BP Location: Right arm, Patient Position: Sitting, BP Cuff Size: Adult)   Pulse 75   Temp 36.5 C (97.7 F) (Temporal)   Ht 1.435 m   Wt 58.5 kg   LMP  (LMP Unknown)   SpO2 99%   BMI 28.41 kg/m   OB Status Postmenopausal   BSA 1.53 m         Review of Systems   All other systems reviewed and are negative.      Physical Exam  HENT:      Head: Normocephalic and atraumatic.      Comments: + frontal sinus pressure on palpation  Assessment/Plan   Problem List Items Addressed This Visit           ICD-10-CM    Chronic tension headache - Primary G44.229    Current Assessment & Plan     Suspect headaches are exacerbated by sinusitis and allergies  Advised antihistamine daily, lots of fluids, sleep  Amitriptyline 1/2 tab at night x 1 week then increase to 1 tab QHS  F/u in 2-4 weeks         Relevant Medications    loratadine (Claritin) 10 mg tablet    amitriptyline (Elavil) 10 mg tablet

## 2020-10-25 ENCOUNTER — Other Ambulatory Visit (INDEPENDENT_AMBULATORY_CARE_PROVIDER_SITE_OTHER): Admitting: Family

## 2020-10-25 ENCOUNTER — Other Ambulatory Visit

## 2020-10-28 NOTE — Assessment & Plan Note (Signed)
Suspect headaches are exacerbated by sinusitis and allergies  Advised antihistamine daily, lots of fluids, sleep  Amitriptyline 1/2 tab at night x 1 week then increase to 1 tab QHS  F/u in 2-4 weeks

## 2020-11-01 ENCOUNTER — Ambulatory Visit: Admit: 2020-11-01 | Discharge: 2020-11-01 | Payer: MEDICARE | Attending: Family | Primary: Family Medicine

## 2020-11-01 ENCOUNTER — Other Ambulatory Visit (INDEPENDENT_AMBULATORY_CARE_PROVIDER_SITE_OTHER): Admitting: Internal Medicine

## 2020-11-01 ENCOUNTER — Telehealth (INDEPENDENT_AMBULATORY_CARE_PROVIDER_SITE_OTHER): Admitting: Internal Medicine

## 2020-11-01 ENCOUNTER — Other Ambulatory Visit

## 2020-11-01 ENCOUNTER — Ambulatory Visit: Attending: Family | Admitting: Family

## 2020-11-01 VITALS — BP 134/70 | HR 81 | Temp 97.2°F | Ht <= 58 in | Wt 129.0 lb

## 2020-11-01 DIAGNOSIS — Z1211 Encounter for screening for malignant neoplasm of colon: Secondary | ICD-10-CM

## 2020-11-01 LAB — HEPATIC FUNCTION PANEL
ALT: 34 U/L (ref 0–55)
AST: 21 U/L (ref 6–42)
Albumin: 4.3 g/dL (ref 3.2–5.0)
Alkaline phosphatase: 55 U/L (ref 30–130)
Bilirubin, direct: 0.1 mg/dL (ref 0.0–0.5)
Bilirubin, total: 0.4 mg/dL (ref 0.2–1.2)
Protein, total: 7.2 g/dL (ref 6.0–8.4)

## 2020-11-01 NOTE — Progress Notes (Signed)
[facility] [place]  [address] [place] [address]  [phone]    Patient ID: [name] is a 65 y.o. female who presents for Follow-up (2 weeks).    Subjective     Patient is follow up to headaches. She was started on antihistamine and amitriptyline.  Pt used to report daily headaches to about 1 episode per week since starting new regimen.  States " I feel so much better, more like myself again"      Patient was taking tylenol multiple times a day to about one every few days to weeks. Her liver enzymes found to be mildly elevated at last visit.       Patient Active Problem List   Diagnosis   . Osteoporosis   . Osteoarthritis   . Benign neoplasm of colon, unspecified   . Elevated blood pressure reading without diagnosis of hypertension   . Vision disorder   . Functional dyspepsia   . Vitamin D deficiency   . Chronic tension headache     Current Outpatient Medications   Medication Instructions   . alendronate (FOSAMAX) 70 mg, oral, Weekly   . amitriptyline (Elavil) 10 mg tablet Please take 1/2 tablet for 1 week, then 1 tablet at night   . cal-D3-mag11-zinc-cop-mang-bor 600 mg calcium- 800 unit-50 mg tablet 1 tablet, oral, Daily RT   . loratadine (CLARITIN) 10 mg, oral, Daily   . lutein 6 mg, oral   . methocarbamol (Robaxin) 500 mg tablet Take 1 tablet in the morning and 1 tablet at night with 400 mg of ibuprofen for 5 days May cause dizziness   . multivitamin (Multi-Delyn) liquid oral solution 5 mL, oral, Daily RT   . omeprazole (PRILOSEC) 20 mg, oral, Daily PRN     Allergies   Allergen Reactions   . Influenza Vaccine Tr-S 11 (Pf)    . Sulfur    . Penicillins Hives and Rash     No past medical history on file.  No past surgical history on file.  No family history on file.                  Objective   Visit Vitals  BP 134/70 (BP Location: Left arm, Patient Position: Sitting, BP Cuff Size: Adult)   Pulse 81   Temp 36.2 C (97.2 F) (Temporal)   Ht 1.422 m   Wt  58.5 kg   LMP  (LMP Unknown)   SpO2 100%   BMI 28.92 kg/m   OB Status Postmenopausal   BSA 1.52 m         Review of Systems   All other systems reviewed and are negative.      Physical Exam  Constitutional:       Appearance: Normal appearance.   HENT:      Head: Normocephalic and atraumatic.   Neurological:      General: No focal deficit present.      Mental Status: She is alert.         Assessment/Plan   Problem List Items Addressed This Visit           ICD-10-CM    Chronic tension headache G44.229    Current Assessment & Plan     Symptoms much improved with daily antihistamine for chronic rhinitis and amitriptyline   Advised to continue regimen    Reduce triggers   F/u in 6 months sooner with uncontrolled or new headaches  Other Issues Addressed       Codes    Colon cancer screening    -  Primary Z12.11    Relevant Orders    Ambulatory referral to Gastroenterology    Elevated LFTs     R79.89    Relevant Orders    Hepatic function panel

## 2020-11-01 NOTE — Patient Instructions (Signed)
Please call gastroenterologist for an appointment:   [phone]  - Dr. Chestine Spore

## 2020-11-01 NOTE — Telephone Encounter (Signed)
 Patient would like to make an appt for colonoscopy please     Best call back [phone]

## 2020-11-01 NOTE — Assessment & Plan Note (Addendum)
Symptoms much improved with daily antihistamine for chronic rhinitis and amitriptyline   Advised to continue regimen    Reduce triggers   F/u in 6 months sooner with uncontrolled or new headaches

## 2020-11-01 NOTE — Telephone Encounter (Signed)
 Procedure: colonoscopy  Indication: screening / hx polyps  Date: 1/16  Doctor: Dr. Chestine Spore  Location: [facility]    IS PATIENT COVID VACCINATED?: YES  Advised to bring documentation    NO Pacemaker/AICD   NO Diabetic meds   NO blood thinners   NO OSA/sleep apnea  NO BMI >45  NO immune suppressants/biologics  NO Positive COVID in last 90 days  NO need for transportation    Ph[facility]acy: [facility] [address] [facility] address confirmed for prep

## 2020-11-01 NOTE — Result Quicknote (Signed)
Pls let Markala know that liver enzymes are back to normal.  Please dont over use tylenol

## 2020-11-02 ENCOUNTER — Encounter (INDEPENDENT_AMBULATORY_CARE_PROVIDER_SITE_OTHER): Admitting: Internal Medicine

## 2020-11-02 NOTE — Telephone Encounter (Signed)
 Patient's medical records (problem list, medications, etc), recent laboratory tests, pertinent hospital/procedure documents reviewed.     Proceed with procedure as scheduled.     Miralax prep    SSA - BC

## 2020-11-02 NOTE — Telephone Encounter (Signed)
Letters generated and mailed to confirmed address

## 2020-11-05 ENCOUNTER — Telehealth (INDEPENDENT_AMBULATORY_CARE_PROVIDER_SITE_OTHER): Admitting: Family

## 2020-11-05 NOTE — Telephone Encounter (Signed)
 Patient calling in stating that she booked her colo apt       Dr Chestine Spore   01/24/2021      Call back # [phone]      Patient missed office call

## 2020-12-22 NOTE — Progress Notes (Deleted)
 Medicare initial wellness      Colonoscopy  Psa  Pap  Hpv  Lmp  Bone Density  Mammogram    Ophthalmology  Dermatology  Dentist    MIIS request failed  Covid   Shingles  Zostavax  Tdap/Td   Pcv 13   Pcv 23   silzaPcv 20  Flu

## 2020-12-26 ENCOUNTER — Other Ambulatory Visit (HOSPITAL_BASED_OUTPATIENT_CLINIC_OR_DEPARTMENT_OTHER): Admitting: Family

## 2020-12-27 NOTE — Telephone Encounter (Signed)
 Last Fill   10/04/20   #90   Zero  Refills    Appointment:        Date of patient's next encounter in the current department:  Visit date not found   Date of patient's next encounter with the current provider:  05/02/2021   Date of patient's last encounter with the current provider: @Lastencthisprov @

## 2020-12-30 ENCOUNTER — Encounter (HOSPITAL_BASED_OUTPATIENT_CLINIC_OR_DEPARTMENT_OTHER)

## 2021-01-12 ENCOUNTER — Telehealth (INDEPENDENT_AMBULATORY_CARE_PROVIDER_SITE_OTHER): Admitting: Internal Medicine

## 2021-01-12 NOTE — Telephone Encounter (Signed)
SWP, confirmed appt date/arrival time, location, prep instructions, transportation, no recent COVID infection/symptoms

## 2021-01-17 ENCOUNTER — Other Ambulatory Visit

## 2021-01-21 ENCOUNTER — Encounter (HOSPITAL_BASED_OUTPATIENT_CLINIC_OR_DEPARTMENT_OTHER)

## 2021-01-24 ENCOUNTER — Other Ambulatory Visit

## 2021-01-24 ENCOUNTER — Ambulatory Visit (HOSPITAL_BASED_OUTPATIENT_CLINIC_OR_DEPARTMENT_OTHER)

## 2021-01-24 ENCOUNTER — Encounter

## 2021-01-24 ENCOUNTER — Encounter (HOSPITAL_BASED_OUTPATIENT_CLINIC_OR_DEPARTMENT_OTHER)

## 2021-01-24 ENCOUNTER — Ambulatory Visit: Payer: MEDICARE | Attending: Internal Medicine | Primary: Family

## 2021-01-24 DIAGNOSIS — Z1211 Encounter for screening for malignant neoplasm of colon: Secondary | ICD-10-CM

## 2021-01-24 DIAGNOSIS — Z86010 Personal history of colonic polyps: Secondary | ICD-10-CM

## 2021-01-24 MED ORDER — lidocaine PF (Xylocaine) 20 mg/mL (2 %) injection
20 | INTRAMUSCULAR | Status: DC | PRN
Start: 2021-01-24 — End: 2021-01-24
  Administered 2021-01-24: 15:00:00 40 via INTRAVENOUS

## 2021-01-24 MED ORDER — sodium chloride 0.9 % infusion
0.9 | INTRAVENOUS | Status: DC | PRN
Start: 2021-01-24 — End: 2021-01-24
  Administered 2021-01-24: 15:00:00 via INTRAVENOUS

## 2021-01-24 MED ORDER — propofol (Diprivan) injection
10 | INTRAVENOUS | Status: DC | PRN
Start: 2021-01-24 — End: 2021-01-24
  Administered 2021-01-24: 15:00:00 30 via INTRAVENOUS
  Administered 2021-01-24: 15:00:00 50 via INTRAVENOUS
  Administered 2021-01-24: 15:00:00 100 via INTRAVENOUS
  Administered 2021-01-24: 15:00:00 20 via INTRAVENOUS

## 2021-01-24 NOTE — H&P (Signed)
Endoscopy Physician Short Form      Scheduled Procedure: Colonoscopy    Diagnosis:   History of colonic polyps  Colon cancer screening    ASA Class: See anesthesia record  Mallampati: See anesthesia record    Allergy:  Allergies   Allergen Reactions   . Influenza Vaccine Tr-S 11 (Pf)    . Sulfur    . Penicillins Hives and Rash        Home Medications: Reviewed  Patient's Medications   New Prescriptions    No medications on file   Previous Medications    ALENDRONATE (FOSAMAX) 70 MG TABLET    Take 70 mg by mouth 1 (one) time per week.    AMITRIPTYLINE (ELAVIL) 10 MG TABLET    Please take 1/2 tablet for 1 week, then 1 tablet at night    CAL-D3-MAG11-ZINC-COP-MANG-BOR 600 MG CALCIUM- 800 UNIT-50 MG TABLET    Take 1 tablet by mouth in the morning.    LORATADINE (CLARITIN) 10 MG TABLET    Take 1 tablet (10 mg) by mouth in the morning.    LUTEIN 6 MG CAPSULE    Take 6 mg by mouth.    METHOCARBAMOL (ROBAXIN) 500 MG TABLET    Take 1 tablet in the morning and 1 tablet at night with 400 mg of ibuprofen for 5 days May cause dizziness    MULTIVITAMIN (MULTI-DELYN) LIQUID ORAL SOLUTION    Take 5 mL by mouth in the morning.    OMEPRAZOLE (PRILOSEC) 20 MG DR CAPSULE    TAKE 1 CAPSULE (20 MG) BY MOUTH IF NEEDED EACH DAY (DYSPEPSIA).   Modified Medications    No medications on file   Discontinued Medications    No medications on file        Medical History:   Past Medical History:   Diagnosis Date   . Chronic tension headaches    . Dyspepsia    . Hx of adenomatous polyp of colon    . Mood disorder (CMS/HCC)    . Osteoarthritis    . Osteoporosis    . Vitamin D deficiency         Physical Exam:   GEN: In no acute distress, alert and oriented     EYES: No conjunctival pallor, no scleral icterus     CV: No abnormalities noted. No LE edema     PULM: No abnormalities noted; No respiratory distress     ABD: No abnormalities noted. Soft, nontender and non-distended.   No guarding, rebound or rigidity.     PSYCH: Mood and affect are  appropriate     Plan:   Colonoscopy      Mendel Corning, MD

## 2021-01-24 NOTE — Anesthesia Pre-Procedure Evaluation (Signed)
Patient: [name]    Procedure Information     Date/Time: 01/24/21 1000    Scheduled providers: Josiah Lobo, MD; Sharlet Salina, MD    Procedure: COLONOSCOPY    Location: [facility]          Relevant Problems   Neuro/Psych   (+) Chronic tension headache      Musculoskeletal   (+) Osteoarthritis       Clinical information reviewed:    Allergies                 Physical Exam    Airway  Mallampati: II  TM distance: >3 FB  Mouth opening: >3 FB  Neck ROM: full     Cardiovascular   Rhythm: regular  Rate: normal  Functional capacity: greater than or equal to 4 METS without symptoms   Dental - normal exam     Pulmonary - normal exam     Abdominal - normal exam     General   Alert                 Anesthesia Plan    ASA 2   NPO status verified    MAC     Airway: natural airway  Monitoring: standard monitors  Postoperative Pain Control: IV/PO analgesics    Essential imaging and labs available and reviewed    Anesthetic plan and risks discussed with patient.  Preprocedure Evaluation: No items outstanding.    Complex patient: No

## 2021-01-24 NOTE — Anesthesia Post-Procedure Evaluation (Signed)
Patient: Angus PalmsMaria Mintzer    Procedure Summary     Date: 01/24/21 Room / Location: [facility]    Anesthesia Start: 1006 Anesthesia Stop: 1030    Procedure: COLONOSCOPY Diagnosis:       History of colonic polyps      Colon cancer screening      (High risk colon cancer surveillance: Personal history of colonic polyps)    Scheduled Providers: Josiah LoboBrian Clark, MD Responsible Provider: Sharlet SalinaYakov Vencent Hauschild, MD    Anesthesia Type: MAC ASA Status: 2          Anesthesia Type: MAC    Vitals Value Taken Time   BP 123/86 01/24/21 1045   Temp  01/24/21 1509   Pulse 63 01/24/21 1045   Resp 14 01/24/21 1045   SpO2 100 % 01/24/21 1045       Anesthesia Post Evaluation Note:    Patient location during evaluation: PACU  Patient participation: able to participate  Level of consciousness: arousable  Cardiovascular and Hydration status: stable  Respiratory Status Stable and Airway Patent: yes  Nausea and Vomiting Control Satisfactory: yes  Pain management: adequate     Vitals reviewed: yes  Unplanned ICU Admission: no      There were no known notable events for this encounter.

## 2021-02-01 ENCOUNTER — Other Ambulatory Visit (HOSPITAL_BASED_OUTPATIENT_CLINIC_OR_DEPARTMENT_OTHER): Admitting: Internal Medicine

## 2021-03-02 ENCOUNTER — Other Ambulatory Visit

## 2021-03-09 ENCOUNTER — Encounter

## 2021-03-09 ENCOUNTER — Other Ambulatory Visit

## 2021-03-09 ENCOUNTER — Ambulatory Visit: Payer: MEDICARE | Primary: Family

## 2021-03-09 DIAGNOSIS — M810 Age-related osteoporosis without current pathological fracture: Secondary | ICD-10-CM

## 2021-03-10 ENCOUNTER — Other Ambulatory Visit: Admit: 2021-03-10 | Payer: MEDICARE | Primary: Family

## 2021-03-10 ENCOUNTER — Other Ambulatory Visit (INDEPENDENT_AMBULATORY_CARE_PROVIDER_SITE_OTHER)

## 2021-03-10 DIAGNOSIS — E559 Vitamin D deficiency, unspecified: Secondary | ICD-10-CM

## 2021-03-10 LAB — BASIC METABOLIC PANEL
Anion Gap: 6 mmol/L (ref 3–14)
BUN: 12 mg/dL (ref 6–24)
CO2 (Bicarbonate): 27 mmol/L (ref 20–32)
Calcium: 9.8 mg/dL (ref 8.5–10.5)
Chloride: 103 mmol/L (ref 98–110)
Creatinine: 0.67 mg/dL (ref 0.55–1.30)
Glucose: 110 mg/dL (ref 70–139)
Potassium: 3.8 mmol/L (ref 3.6–5.2)
Sodium: 136 mmol/L (ref 135–146)
eGFRcr: 96 mL/min/{1.73_m2} (ref >=60)

## 2021-03-10 LAB — VITAMIN D 25 HYDROXY: Vit D, 25-Hydroxy: 48 ng/mL

## 2021-03-10 LAB — ALBUMIN: Albumin: 3.2 g/dL (ref 3.2–5.0)

## 2021-03-15 ENCOUNTER — Encounter (HOSPITAL_BASED_OUTPATIENT_CLINIC_OR_DEPARTMENT_OTHER): Admitting: Internal Medicine

## 2021-03-15 ENCOUNTER — Ambulatory Visit (HOSPITAL_BASED_OUTPATIENT_CLINIC_OR_DEPARTMENT_OTHER): Admitting: Internal Medicine

## 2021-03-15 ENCOUNTER — Other Ambulatory Visit

## 2021-03-15 ENCOUNTER — Ambulatory Visit: Admit: 2021-03-15 | Discharge: 2021-03-15 | Payer: MEDICARE | Attending: Internal Medicine | Primary: Family

## 2021-03-15 VITALS — BP 111/72 | HR 73 | Ht 59.0 in | Wt 128.0 lb

## 2021-03-15 DIAGNOSIS — M810 Age-related osteoporosis without current pathological fracture: Secondary | ICD-10-CM

## 2021-03-15 DIAGNOSIS — E559 Vitamin D deficiency, unspecified: Secondary | ICD-10-CM

## 2021-03-15 MED ORDER — alendronate (Fosamax) 70 mg tablet
70 | ORAL_TABLET | ORAL | 2 refills | Status: AC
Start: 2021-03-15 — End: ?

## 2021-03-15 NOTE — Progress Notes (Signed)
[facility]  [address]  [place] [address]  [phone]    Endocrinology Clinic Note    Reason for Referral:  Referring Physician: Donato Schultz, NP    History of present illness:  [name] is a 66 y.o. female with a history of GERD referred in endocrine consultation for osteoporosis at the request of PCP.   She was on alendronate from 2009 to 03-2013.She has been on drug holiday till 03-2018.  Had been on risedronate before that.     Restarted fosamax 70 mg weekly 2020 by PCP and has been on it for 3 years with improvement in BMD    BMD  T score spine  FN   Total hip  2023           -2.8      -1.7    -1.4  2021           -3        -1.9     -1.2  2019           -3.3      -2.3    -1.7  2016           -2.9      -1.7  She is on  vitamin D 1000 IU daily., no falls/fractures  02-2017: BMD reviewed and worsening scores, spine -3.3, Hip -2.3, We had discussed doing Reclast last year but she was traveling to [place] and did not reschedule her Reclast.  No new fractures or falls.Most recently we discussed doing Prolia but it turns out she does not have drug coverage.  [facility] and [facility] would told that Fos[facility]ax will be covered.    The patient reports puberty occurred at age 58, similar to peers. Periods were regular.  She is G5P4.  Menopause occurred at age 85.  Was briefly on HRT at that time.    At around age 29 she was diagnosed with osteoporosis.  Her dairy intake is minimal: occasional yogurt, has almond milk every night.     She reports no extended period of prednisone use. She has no history of kidney stones.  She reports no constipation.   No polyuria.          Assessment/Plan:    [name] is a 66 y.o. female with a history of GERD referred in endocrine consultation for osteoporosis at the request of PCP.   She was on alendronate from 2009 to 03-2013.  Had been on risedronate before that. 09-2014 BMD with T scores -2.9 in AP spine and -1.7 in left  femur.02-2017 spine -3.3, Hip -2.3 , PC we did follow bone density in 2021 with just 1 year of Fosamax and was already improving as above She has no history of fracture.  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   She was on alendronate for 6 years.  Her spinal BMD has declined since 2014 but probably not significant--it is still higher that it was compared to 2011 as bisphos worked. Hip BMD now worst.  We discussed options of both Reclast versus Prolia .  She had initially wanted to do Reclast but is very afraid of side effects and today wanted Prolia.  But it appears that she does not have a drug coverage.  She has been oral Fosamax again since 03-2019, F/U BMD 2023  Improved.will do total 5 years of oral bisphos.  f/u BMD 03-2023, labs  next month    In the meantime, discussed goal of 1200 mg daily calcium via diet or pill. Gets vitamin D via citracal-D and MVI. Recent kidney function, calcium and vitamin D          Thank you for Involving in this patient's care. Please do not hesitate to contact with questions or concerns      Rosina Lowenstein, MD     Pertinent lab review:    Lab Results   Component Value Date    CALCIUM 9.8 03/10/2021      No results found for: TSH15, TSH, TSH, TSHBASE, TSH, TSH, TSHWREFLT4, TSHWREFL, EXTTSH3   Lab Results   Component Value Date    CALCIUM 9.8 03/10/2021    CALCIUM 9.4 09/20/2020    VITD25 48 03/10/2021    ALBUMIN 3.2 03/10/2021    ALBUMIN 4.3 11/01/2020        BMD:    === 03/09/21 ===    BD DEXA AXIAL    - Impression -  Osteoporosis        [name] 03/10/2021 7:45 AM     Medical History:    Past Medical History:   Diagnosis Date   . Chronic tension headaches    . Dyspepsia    . Hx of adenomatous polyp of colon    . Mood disorder (CMS/HCC)    . Osteoarthritis    . Osteoporosis    . Vitamin D deficiency          Surgical History:    Past Surgical History:   Procedure Laterality Date   . COLONOSCOPY             Family History:    @BIMFAMHISTORY @       Social History:    No valid surgical or medical questions entered.        Active medical problems:    Problem List Items Addressed This Visit    None  Visit Diagnoses     Encounter for long-term (current) use of medications                  Current medications:      Current Outpatient Medications:   .  alendronate (Fosamax) 70 mg tablet, TAKE 1 TABLET BY MOUTH ONE TIME PER WEEK, Disp: 12 tablet, Rfl: 4  .  amitriptyline (Elavil) 10 mg tablet, Please take 1/2 tablet for 1 week, then 1 tablet at night, Disp: 90 tablet, Rfl: 11  .  cal-D3-mag11-zinc-cop-mang-bor 600 mg calcium- 800 unit-50 mg tablet, Take 1 tablet by mouth in the morning., Disp: 90 each, Rfl: 0  .  loratadine (Claritin) 10 mg tablet, Take 1 tablet (10 mg) by mouth in the morning., Disp: 90 tablet, Rfl: 2  .  lutein 6 mg capsule, Take 6 mg by mouth., Disp: , Rfl:   .  methocarbamol (Robaxin) 500 mg tablet, Take 1 tablet in the morning and 1 tablet at night with 400 mg of ibuprofen for 5 days May cause dizziness (Patient not taking: Reported on 01/24/2021), Disp: 10 tablet, Rfl: 0  .  multivitamin (Multi-Delyn) liquid oral solution, Take 5 mL by mouth in the morning., Disp: , Rfl:   .  omeprazole (PriLOSEC) 20 mg DR capsule, TAKE 1 CAPSULE (20 MG) BY MOUTH IF NEEDED EACH DAY (DYSPEPSIA)., Disp: 90 capsule, Rfl: 0       Allergies:    Allergies   Allergen Reactions   . Influenza Vaccine Tr-S 11 (Pf)    .  Sulfur    . Penicillins Hives and Rash            ROS:    No palpitations, no tremor, no heat intolerance. No cold intolerance.  No diarrhea or constipation. No change in hair or skin. No dysphagia, no odynophagia, no solid food or pill dysphagia. No diplopia/blurry vision.No fevers/chills.No change in voice.No anterior neck pressure. No increase in neck size.No wheeze or DOE.No rash.No edema. No cough.No anxiety/Depression.Regular menses Rest of ROS negative or non-contributory.       Physical Exam:    Visit  Vitals  BP 111/72 (BP Location: Left arm, Patient Position: Sitting, BP Cuff Size: Large adult)   Pulse 73      ZOX:WRUEAGEN:awake alert no acute distress  HEENT: head atraumatic, normocephalic, no redness in eyes no discharge from nose  SKIN: facial skin does not show any cyanosis  RESP: speaking in full sentences, no increased respiratory effort seen  MSK: normal ROM in fingers  Neuro: facial nerves intact  Pysch: appearance, behavior and attitude-pleasant, cooperative and groomed                   This note has been created with voice recognition software.  While this note has been edited for accuracy, this software periodically misinterprets speech resulting in errors that might not have been caught in editing.  In the event an unusual error is found in this record, please notify us at  [phone] to resolve the error.

## 2021-05-02 ENCOUNTER — Ambulatory Visit: Admit: 2021-05-02 | Discharge: 2021-05-02 | Payer: MEDICARE | Attending: Family | Primary: Family Medicine

## 2021-05-02 DIAGNOSIS — H10409 Unspecified chronic conjunctivitis, unspecified eye: Secondary | ICD-10-CM

## 2021-05-02 MED ORDER — erythromycin (Romycin) 5 mg/gram (0.5 %) ophthalmic ointment
5 | Freq: Four times a day (QID) | OPHTHALMIC | 1 refills | Status: AC
Start: 2021-05-02 — End: 2021-05-09

## 2021-05-02 NOTE — Progress Notes (Signed)
 Medicare Annual Wellness Visit Health Risk Assessment Questionnaire    Cognitive Screen  I would like to ask you some questions that ask you to use your memory.  I am going to name three objects.  Please wait until I say all three words, then repeat them.  Remember what they are because I am going to ask you to name them again in a few minutes.  Repeat these words for me: APPLE - TABLE - PENNY.           1. What year is this? Correct  2. What month is this? Correct  3. What is the day of the week? Correct    What were the three words I asked you to remember?  APPLE: Correct  TABLE: Correct  PENNY: Correct      Hearing Impairment  Do you have trouble hearing the television or radio when others do not? No  Do you have to strain or struggle to hear/understand conversations? No     Kalz Index of Independence in ADLs   Bathing Independent-1    Continence Independent-1  Dressing Independent-1   Feeding Independent-1   Toileting Independent-1    Transferring Independent-1    ADL Score 6    (If ADL Score=5, then patient independent. If ADL Score = 0, then patient is very dependent.)    Home Safety  Do you have adequate housing? Yes    Do you have transportation? Yes  Do you have sufficient food? Yes  Do you have access to a telephone? Yes  Do you have actual or threats of physical, emotional abuse, sexual abuse? No  Do you feel safe at home? Yes     Home Safety Assessment Needed? No      Fall Risk assessment  Please answer Yes or No to the following:  1. Do you live alone? No  2. Is someone available to help you if you needed and wanted help? Yes, as much as I wanted  3. Does your home have:  1. Poor lightening? No   2. Grab bars in bathroom? No     3. Functioning smoke alarms? Yes     4. Throw rugs? No      5. A slippery tub? No     6. Handrails on stairs or steps? Yes     7. Functioning carbon monoxide alarms? Yes    4. Are you afraid of falling? No     5. Have you fallen two or more times in the past year? No        Health Risk Assessment    1. How would you rate your health in general? Good  2. During the past 4 weeks, how much have you been bothered by emotional problems such as feeling anxious, depressed, irritable, sad, or downhearted and blue? Not at all  3. During the past 4 weeks, has your physical and emotional health limited your social activities with family, friends, neighbors, or groups? Not at all  4. During the past 4 weeks, how much bodily pain have you generally had?  No pain  5. Can you get to places out of walking distance without help? (For example, can you travel alone on buses or taxis, or drive your own car) Yes     6. Can you go shopping for groceries or clothes without someone's help?  Yes     7. Can you prepare your own meals? Yes      8. Can you do your  housework without help? Yes     9. Can you handle your own money without help? Yes      10. Do you smoke or use tobacco products? No     11. During the past 4 weeks, How many drinks of wine, beer, or other alcoholic beverages did you have? 1 drink or less per week           12. Do you exercise for about 20 minutes 3 or more days a week? Yes, most of the time                (Adapted from [employer]. Related article: https://www.PopCommunication.fr)

## 2021-05-02 NOTE — Progress Notes (Signed)
[facility]  [facility] [place]  [address]  [place] [address]  Dept: [phone]  Dept Fax: [phone]     Patient ID: [name] is a 66 y.o. female who presents for Follow-up.    Subjective   Following to multisystem issues. Reporting stable headaches with daily Claritin. Pt discontinued amatriptyline after experiencing palpitations.  Patient mentioned recurrent conjunctivitis and requesting erythromycin      Patient Active Problem List   Diagnosis   . Osteoporosis   . Osteoarthritis   . Benign neoplasm of colon, unspecified   . Elevated blood pressure reading without diagnosis of hypertension   . Vision disorder   . Functional dyspepsia   . Vitamin D deficiency   . Chronic tension headache     Current Outpatient Medications   Medication Instructions   . alendronate (Fosamax) 70 mg tablet TAKE 1 TABLET BY MOUTH ONE TIME PER WEEK   . cal-D3-mag11-zinc-cop-mang-bor 600 mg calcium- 800 unit-50 mg tablet 1 tablet, oral, Daily RT   . erythromycin (Romycin) 5 mg/gram (0.5 %) ophthalmic ointment ophthalmic (eye), 4 times daily, Apply Amount per Dose: 0.5 inch (~1 cm) per dose.   . loratadine (CLARITIN) 10 mg, oral, Daily   . lutein 6 mg, oral   . multivitamin (Multi-Delyn) liquid oral solution 5 mL, oral, Daily RT   . omeprazole (PRILOSEC) 20 mg, oral, Daily PRN     Allergies   Allergen Reactions   . Influenza Vaccine Tr-S 11 (Pf)    . Sulfur    . Amitriptyline Palpitations   . Penicillins Hives and Rash     Past Medical History:   Diagnosis Date   . Chronic tension headaches    . Dyspepsia    . Hx of adenomatous polyp of colon    . Mood disorder (CMS/HCC)    . Osteoarthritis    . Osteoporosis    . Vitamin D deficiency        Objective   Visit Vitals  BP 104/60 (BP Location: Right arm, Patient Position: Sitting, BP Cuff Size: Adult)   Pulse 75   Temp 36.7 C (98 F) (Temporal)   Ht 1.499 m   Wt 55.8 kg   LMP   (LMP Unknown)   SpO2 97%   BMI 24.84 kg/m   OB Status Postmenopausal   BSA 1.52 m     Review of Systems   All other systems reviewed and are negative.        Physical Exam  HENT:      Head: Normocephalic.   Cardiovascular:      Rate and Rhythm: Normal rate.   Pulmonary:      Effort: Pulmonary effort is normal.   Neurological:      General: No focal deficit present.      Mental Status: She is alert.   Psychiatric:         Mood and Affect: Mood normal.         Assessment/Plan   [name] was seen today for follow-up.  Chronic conjunctivitis, unspecified chronic conjunctivitis type, unspecified laterality  -     erythromycin (Romycin) 5 mg/gram (0.5 %) ophthalmic ointment; Apply to affected eye(s) four times daily for 7 days. Apply Amount per Dose: 0.5 inch (~1 cm) per dose.  Chronic tension-type headache, not intractable  Assessment & Plan:  Stable condition with management of allergy symptoms  Elevated blood pressure reading without diagnosis of hypertension  Assessment & Plan:  Blood pressure stable   We will cont to monitor

## 2021-05-06 NOTE — Assessment & Plan Note (Signed)
Stable condition with management of allergy symptoms

## 2021-05-06 NOTE — Assessment & Plan Note (Signed)
Blood pressure stable   We will cont to monitor

## 2021-06-13 NOTE — Telephone Encounter (Signed)
Last refill 12/27/20 90x0    Appointment:        Date of patient's next encounter in the current department:  Visit date not found   Date of patient's next encounter with the current provider:  Visit date not found   Date of patient's last encounter with the current provider: @Lastencthisprov @     Last BP:   BP Readings from Last 3 Encounters:   05/02/21 104/60   03/15/21 111/72   01/24/21 123/86       Last HGBA1C: No results found for: HGBA1C    Labs  No components found for: CREAT  AST   Date Value Ref Range Status   11/01/2020 21 6 - 42 U/L Final   09/20/2020 28 6 - 42 U/L Final     ALT   Date Value Ref Range Status   11/01/2020 34 0 - 55 U/L Final   09/20/2020 58 (H) 0 - 55 U/L Final     No components found for: TBILI  Hemoglobin   Date Value Ref Range Status   09/20/2020 14.1 11.0 - 16.0 g/dL Final     Hematocrit   Date Value Ref Range Status   09/20/2020 42.5 32.0 - 47.0 % Final     Platelets   Date Value Ref Range Status   09/20/2020 242 150 - 400 K/uL Final

## 2021-06-30 NOTE — Telephone Encounter (Signed)
Patient calling in because she got a letter Dr. Jiles Gartereluca will be leaving and would like to switch to Dr. Benjiman CoreJha.    Patient scheduled for Medicare annual wellness appt on 12.26.23 at 9am    Please advise.    Best callback number: [phone]

## 2021-10-11 ENCOUNTER — Telehealth: Admit: 2021-10-11 | Discharge: 2021-10-11 | Payer: MEDICARE | Attending: Family Medicine | Primary: Family Medicine

## 2021-10-11 DIAGNOSIS — U071 COVID-19: Secondary | ICD-10-CM

## 2021-10-11 MED ORDER — nirmatrelvir-ritonavir (Paxlovid) 300 mg (150 mg x 2)-100 mg tablets,dose pack dose pack
300 | ORAL_TABLET | Freq: Two times a day (BID) | ORAL | 0 refills | Status: AC
Start: 2021-10-11 — End: 2021-10-16

## 2021-10-11 NOTE — Telephone Encounter (Signed)
Breathing is ok, lower back pain.  Is taking Advil for body aches, headaches.      Video appt. With Dr. Benjiman Core.

## 2021-10-11 NOTE — Patient Instructions (Signed)
Covid-19 Positive Patient information:    Now that you've tested positive for COVID-19, please do the following:     If you are asymptomatic or have mild COVID-19, you should isolate through at least day 5 (day 0 is the day symptoms appeared or the date the specimen was collected for the positive test for people who are asymptomatic). You should wear a mask through day 10. A test-based strategy may be used to remove a mask sooner.    If you have moderate or severe COVID-19 should, you should isolate through at least day 10. Those with severe COVID-19 may remain infectious beyond 10 days and may need to extend isolation for up to 20 days.    People who are moderately or severely immunocompromised should isolate through at least day 20. Use of serial testing and consultation with an infectious disease specialist is recommended in these patients prior to ending isolation.    Monitor your symptoms.  Contact your provider if you are feeling worse.  If you have shortness of breath or difficulty breathing, call 911 and tell them you are positive for COVID-19.    Social isolation after illness is recommended.    The most up to date advice for preventing the spread of COVID-19 can be found here:   https://www.cdc.gov/coronavirus/2019-ncov/hcp/guidance-prevent-spread.html

## 2021-10-11 NOTE — Telephone Encounter (Signed)
Patient is returning phone call.    Patched to office.    Call back: [phone]

## 2021-10-11 NOTE — Progress Notes (Signed)
[facility]  [facility] Internal [facility] [place]  [address]  [place] [address]  Dept: [phone]  Dept Fax: [phone]     Patient ID: [name] is a 66 y.o. female who presents for No chief complaint on file..  n/a was present who provided provided independent history during this visit.      Subjective   HPI    PAXLOVID PATIENT QUESTIONS & TALKING POINTS    Do you have a positive Covid test? Yes    What are your symptoms? Headache,sore throat, body aches, cough and sneezing,  no fevers, no sob, no wheezing.    How long have you had symptoms? [If more than 5 days, Paxlovid is not indicated]. 3 days    What is the patient's high-risk condition? [See high risk list]      Do you have high blood pressure? [Paxlovid can cause hypertension] No  BP Readings from Last 3 Encounters:   05/02/21 104/60   03/15/21 111/72   01/24/21 123/86         Do you have kidney disease? [Check most recent eGFR for dosing guidelines] No  Lab Results   Component Value Date    GLUCOSE 110 03/10/2021    CALCIUM 9.8 03/10/2021    NA 136 03/10/2021    K 3.8 03/10/2021    CO2 27 03/10/2021    CL 103 03/10/2021    BUN 12 03/10/2021    CREATININE 0.67 03/10/2021         Do you have liver disease? [Paxlovid can cause elevation in liver enzymes, clinical hepatitis, jaundice] No  Lab Results   Component Value Date    ALT 34 11/01/2020    AST 21 11/01/2020    ALKPHOS 55 11/01/2020    BILITOT 0.4 11/01/2020       What vaccines have you had against Covid-primary series, boosters?     Immunization History   Administered Date(s) Administered   . Covid-19 Moderna vaccine monovalent (12y+) 03/15/2019, 04/12/2019, 01/17/2020   . Td (adult), unspecified 05/15/1995   . Zoster, Recombinant 12/21/2016, 05/17/2017         Any allergies to ritonavir or nirmatrelvir? No    Are you pregnant? No    Are you breast feeding? No    Is the patient's medication list is up to  date. Yes    Did you Inform patient that there are MULTIPLE drug interactions with Paxlovid and that their provider will discuss with them if there are any medications requiring dose or frequency adjustment or avoidance.  Yes  Objective   Visit Vitals  LMP  (LMP Unknown)   OB Status Postmenopausal       Physical Exam  Constitutional:       Appearance: Normal appearance.   Pulmonary:      Effort: Pulmonary effort is normal. No respiratory distress.   Neurological:      Mental Status: She is alert.         Assessment/Plan   Problem List Items Addressed This Visit    None  Visit Diagnoses     COVID-19    -  Primary    Relevant Medications    nirmatrelvir-ritonavir (Paxlovid) 300 mg (150 mg x 2)-100 mg tablets,dose pack dose pack              -Discussed paxlovid use within 5 days of symptom onset.  -Encourage hydration, rest, tylenol prn.  -Indications for using  ER are worsening symtoms, uncontrolled fever. Inability to hydrate, worsening shortness of breath.  -All patient's given paxlovid are counseled regarding potential risk of paxlovid, drug drug interactions and Emergency use authorization issued for treatment of mild-to-moderate coronavirus disease 2019 (COVID-19) in adults and pediatric patients (aged ?12 years and weight ?40 kg) testing positive.  -All patients prescribed paxlovid are counseled that this medication is used to prevent hospitalization in patient's at risk for hospitalization from covid.  -Reviewed potential for COVID rebound post Paxlovid: CDC has issued a health advisory regarding potential for recurrence of COVID-19 or "viral RNA rebound" after case reports documented persons treated with nirmatrelvir/ritonavir and having recovered can experience recurrent illness. COVID-19 rebound is characterized by reoccurrence of symptoms or a new positive viral test after having testing negative. Currently, there is no evidence that additional treatment is needed for COVID-19 rebound and patient monitoring  continues to be the most appropriate management of symptom reoccurrence. Persons with COVID-19 rebound should: Re-isolate for at least 5 days, Per CDC guidance, they can end their re-isolation period after 5 full days if fever has resolved for 24 hr (without use of antipyretics) and symptoms are improving, The patient should wear a mask for a total of 10 days after rebound symptoms started. Consider clinical evaluation of patients who have COVID-19 rebound and symptoms that persist or worsen      This real-time, interactive virtual Telehealth encounter was done by video with the patient's verbal consent. Two patient identifiers were used and confirmed. Physical location of the patient: home. Patient resides in: [place]  Physical location of the provider: office. Other participants/involvement: none  Total minutes spent: 20      Follow up if symptoms worsen or fail to improve.    Electronically signed by: Pat Kocher, MD 10/11/2021

## 2021-10-11 NOTE — Telephone Encounter (Signed)
Daughter requesting callback to discuss plan of care for patient, says  patient testing positive for covid on Saturday 09/30    Congested, head ache, fatigue, chills, back pain, has not eaten in 3 days been in bed the whole time       Callback# [phone]

## 2021-10-11 NOTE — Telephone Encounter (Signed)
sxs started Sunday 10/1 and tested covid pos. sunday 10/1.  Daughter doesn't know if patient is having chest congestion or not, doesn't know if back pain is upper or lower.  Told daughter if patient has SOB/chest pain- needs to go to ER.    lmtcb with patient to assess sxs more.    We go by CDC guidelines, you may go to their website:  TonerPromos.no for the quarantine and isolation calculator.  Mild symptoms:  Isolation may end 5 days after symptom onset (1st day is day zero) and after fever ends for 24 hours (without taking antipyretics), as long as you are feeling well.  Then for the next 5 days need to wear a well fitting mask whenever around others (avoid people that may get very sick if they get COVID (elderly people, diabetics, etc.), no traveling.  Moderate symptoms:  isolate for 10 days.  Severely ill/immunocompromised patients:  may need to isolate for 20 days.  Be sure to drink plenty of fluids so you do not get dehydrated (if you are unable to drink fluids you will need to go to emergency room).  You will need to go to the emergency room if you develop shortness of breath and/or chest pain.  You can drink tea with honey for a cough (just as effective as OTC medications).  Can gargle with warm water and salt for sore throat.  Take your normal medications  you take for headaches, fever, body aches.  If you use inhalers or nasal spray, use as you normally do.  If interested in getting antiviral Paxlovid, would need a video appt.- common SE are:  diarrhea, muscle pain, elevated BP.  Can have medication interactions and would need to discuss with provider.  Paxlovid is recommended for patients 65 years or older or patients 50 years and older that have co-morbidities.

## 2021-10-12 ENCOUNTER — Ambulatory Visit: Payer: MEDICARE | Primary: Family Medicine

## 2021-11-09 NOTE — Telephone Encounter (Signed)
Last visit 10/11/21 dr Benjiman Corejha  Next visit 01/03/22 dr Benjiman Corejha  Appointment:        Date of patient's next encounter in the current department:  01/03/2022   Date of patient's next encounter with the current provider:  Visit date not found   Date of patient's last encounter in the current department: 05/02/2021   Date of patient's last annual exam    Allergies   Allergen Reactions   . Influenza Vaccine Tr-S 11 (Pf)    . Sulfur    . Amitriptyline Palpitations   . Penicillins Hives and Rash        Last BP:   BP Readings from Last 3 Encounters:   05/02/21 104/60   03/15/21 111/72   01/24/21 123/86       Last HGBA1C: No results found for: HGBA1C    Labs  No results found for: TSH  Lab Results   Component Value Date    CALCIUM 9.8 03/10/2021    ALBUMIN 3.2 03/10/2021    NA 136 03/10/2021    K 3.8 03/10/2021    CO2 27 03/10/2021    CL 103 03/10/2021    BUN 12 03/10/2021    CREATININE 0.67 03/10/2021      AST   Date Value Ref Range Status   11/01/2020 21 6 - 42 U/L Final   09/20/2020 28 6 - 42 U/L Final     ALT   Date Value Ref Range Status   11/01/2020 34 0 - 55 U/L Final   09/20/2020 58 (H) 0 - 55 U/L Final     No components found for: TBILI  Hemoglobin   Date Value Ref Range Status   09/20/2020 14.1 11.0 - 16.0 g/dL Final     Hematocrit   Date Value Ref Range Status   09/20/2020 42.5 32.0 - 47.0 % Final     Platelets   Date Value Ref Range Status   09/20/2020 242 150 - 400 K/uL Final     No results found for: PTT  No results found for: INR  No results found for: PROTIME  No results found for: PTADJUSTED    Last Urine Drug Screen: on .  Narcotic Medications:    Directives/Controlled Substance Agreement   PMP Appropriate __YES      ___No

## 2022-01-03 ENCOUNTER — Ambulatory Visit: Admit: 2022-01-03 | Discharge: 2022-01-03 | Payer: MEDICARE | Attending: Family Medicine | Primary: Family Medicine

## 2022-01-03 DIAGNOSIS — Z7689 Persons encountering health services in other specified circumstances: Secondary | ICD-10-CM

## 2022-01-03 MED ORDER — cetirizine (ZyrTEC) 10 mg tablet
10 | ORAL_TABLET | Freq: Every day | ORAL | 2 refills | 90.00000 days | Status: AC
Start: 2022-01-03 — End: ?

## 2022-01-03 MED ORDER — omeprazole (PriLOSEC) 20 mg DR capsule
20 | ORAL_CAPSULE | Freq: Every day | ORAL | 1 refills | 60.00000 days | Status: AC | PRN
Start: 2022-01-03 — End: ?

## 2022-01-03 NOTE — Assessment & Plan Note (Signed)
Followed by Dr Tedra Senegalhakkar  She was on alendronate from 2009 to 03-2013.She has been on drug holiday till 03-2018. Had been on risedronate before that.   Restarted fosamax 70 mg weekly 2020 by PCP and has been on it for 3 years with improvement in BMD. Her spinal BMD has declined since 2014 but probably not significant--it is still higher that it was compared to 2011 as bisphos worked. Hip BMD now worst. Endo discussed options of both Reclast versus Prolia . She had initially wanted to do Reclast but is very afraid of side effects and today wanted Prolia. But it appears that she does not have a drug coverage. She has been oral Fosamax again since 03-2019, F/U BMD 2023,Improved. Pt will f.u in 03/2022 for next steps with endo.

## 2022-01-03 NOTE — Assessment & Plan Note (Signed)
Controlled.  

## 2022-01-03 NOTE — Assessment & Plan Note (Signed)
Normotensive, continue to monitor.

## 2022-01-03 NOTE — Assessment & Plan Note (Signed)
Refill PPI, advised only prn use.

## 2022-01-03 NOTE — Progress Notes (Signed)
[facility]  [facility] Internal Medicine [place]  [address]  [place] [address]  Dept: [phone]  Dept Fax: [phone]     Patient ID: [name] is a 66 y.o. female who presents for Consult [facility].).  N/a  was present who provided provided independent history during this visit.      Subjective   HPI  #est care  Prior pcpAlla German, NP  Last cpe/maw: 04/2021    #headaches    #OP  She was on alendronate from 2009 to 03-2013.She has been on drug holiday till 03-2018. Had been on risedronate before that.   Restarted fosamax 70 mg weekly 2020 by PCP and has been on it for 3 years with improvement in BMD. Her spinal BMD has declined since 2014 but probably not significant--it is still higher that it was compared to 2011 as bisphos worked. Hip BMD now worst. Endo discussed options of both Reclast versus Prolia . She had initially wanted to do Reclast but is very afraid of side effects and today wanted Prolia. But it appears that she does not have a drug coverage. She has been oral Fosamax again since 03-2019, F/U BMD 2023  Improved.will do total 5 years of oral bisphos.f/u BMD 03-2023  goal of 1200 mg daily calcium via diet or pill. Gets vitamin D via citracal-D and MVI      #GERD  Does not have refill, she has been taking otc as needed.      #HM  UTD CRC, mamogram  Last Paps wnl  MAW/CPE: 4.2024    Objective   Visit Vitals  BP 130/76 (BP Location: Right arm, Patient Position: Sitting, BP Cuff Size: Adult)   Pulse 71   Temp 36.2 C (97.1 F) (Temporal)   Ht 1.461 m   Wt 57.2 kg   LMP  (LMP Unknown)   SpO2 99%   BMI 26.84 kg/m   OB Status Postmenopausal   BSA 1.52 m       Physical Exam  Vitals reviewed.   Constitutional:       Appearance: Normal appearance.   HENT:      Head: Normocephalic.   Cardiovascular:      Rate and Rhythm: Normal rate and regular rhythm.      Heart sounds: No murmur heard.  Pulmonary:       Effort: Pulmonary effort is normal.      Breath sounds: Normal breath sounds. No wheezing.   Skin:     General: Skin is warm.   Neurological:      Mental Status: She is alert and oriented to person, place, and time.         Assessment/Plan   Problem List Items Addressed This Visit        Medium    Osteoporosis     Followed by Dr Tedra Senegal  She was on alendronate from 2009 to 03-2013.She has been on drug holiday till 03-2018. Had been on risedronate before that.   Restarted fosamax 70 mg weekly 2020 by PCP and has been on it for 3 years with improvement in BMD. Her spinal BMD has declined since 2014 but probably not significant--it is still higher that it was compared to 2011 as bisphos worked. Hip BMD now worst. Endo discussed options of both Reclast versus Prolia . She had initially wanted to do Reclast but is very afraid of side effects and today wanted Prolia. But it appears that  she does not have a drug coverage. She has been oral Fosamax again since 03-2019, F/U BMD 2023,Improved. Pt will f.u in 03/2022 for next steps with endo.           Benign neoplasm of colon, unspecified     Colonoscopy in 1.2023 with Dr Chestine Spore, repeat in 5 years.           Elevated blood pressure reading without diagnosis of hypertension     Normotensive, continue to monitor.         Functional dyspepsia     Refill PPI, advised only prn use.           Relevant Medications    omeprazole (PriLOSEC) 20 mg DR capsule    Chronic tension headache     Controlled           Allergic rhinitis     Allergy symptoms are controlled with cetirizine.         Relevant Medications    cetirizine (ZyrTEC) 10 mg tablet   Other Visit Diagnoses     Establishing care with new doctor, encounter for    -  Primary              Follow up in about 4 months (around 05/05/2022) for physical.    Electronically signed by: Pat Kocher, MD 01/03/2022

## 2022-01-03 NOTE — Assessment & Plan Note (Signed)
Colonoscopy in 1.2023 with Dr Chestine Spore, repeat in 5 years.

## 2022-01-03 NOTE — Assessment & Plan Note (Signed)
Allergy symptoms are controlled with cetirizine.

## 2022-01-03 NOTE — Assessment & Plan Note (Signed)
>>  ASSESSMENT AND PLAN FOR FUNCTIONAL DYSPEPSIA WRITTEN ON 01/03/2022  9:22 AM BY Jevaughn Degollado SAL BUSTLE, MD    Refill PPI, advised only prn use.

## 2022-01-10 ENCOUNTER — Inpatient Hospital Stay: Admit: 2022-01-10 | Payer: MEDICARE | Primary: Family Medicine

## 2022-01-10 DIAGNOSIS — Z1231 Encounter for screening mammogram for malignant neoplasm of breast: Secondary | ICD-10-CM

## 2022-02-07 ENCOUNTER — Emergency Department: Admit: 2022-02-07 | Payer: MEDICARE | Primary: Family Medicine

## 2022-02-07 ENCOUNTER — Inpatient Hospital Stay
Admit: 2022-02-07 | Discharge: 2022-02-07 | Disposition: A | Discharge status: Home / Self Care | Payer: MEDICARE | Attending: Emergency Medicine

## 2022-02-07 DIAGNOSIS — R519 Headache, unspecified: Secondary | ICD-10-CM

## 2022-02-07 DIAGNOSIS — G44221 Chronic tension-type headache, intractable: Secondary | ICD-10-CM

## 2022-02-07 LAB — COMPREHENSIVE METABOLIC PANEL
ALT: 40 U/L (ref 0–55)
AST: 22 U/L (ref 6–42)
Albumin: 3.8 g/dL (ref 3.2–5.0)
Alkaline phosphatase: 52 U/L (ref 30–130)
Anion Gap: 4 mmol/L (ref 3–14)
BUN: 17 mg/dL (ref 6–24)
Bilirubin, total: 0.2 mg/dL (ref 0.2–1.2)
CO2 (Bicarbonate): 27 mmol/L (ref 20–32)
Calcium: 9.7 mg/dL (ref 8.5–10.5)
Chloride: 107 mmol/L (ref 98–110)
Creatinine: 0.67 mg/dL (ref 0.55–1.30)
Glucose: 126 mg/dL (ref 70–139)
Potassium: 3.7 mmol/L (ref 3.6–5.2)
Protein, total: 7.4 g/dL (ref 6.0–8.4)
Sodium: 138 mmol/L (ref 135–146)
eGFRcr: 95 mL/min/{1.73_m2} (ref >=60)

## 2022-02-07 LAB — CBC WITH DIFFERENTIAL
Basophils %: 0.5 %
Basophils Absolute: 0.03 10*3/uL (ref 0.00–0.22)
Eosinophils %: 1.7 %
Eosinophils Absolute: 0.11 10*3/uL (ref 0.00–0.50)
Hematocrit: 42.6 % (ref 32.0–47.0)
Hemoglobin: 14.2 g/dL (ref 11.0–16.0)
Immature Granulocytes %: 0.3 %
Immature Granulocytes Absolute: 0.02 10*3/uL (ref 0.00–0.10)
Lymphocyte %: 34.8 %
Lymphocytes Absolute: 2.22 10*3/uL (ref 0.70–4.00)
MCH: 29.6 pg (ref 26.0–34.0)
MCHC: 33.3 g/dL (ref 31.0–37.0)
MCV: 88.8 fL (ref 80.0–100.0)
MPV: 10.7 fL (ref 9.1–12.4)
Monocytes %: 6.9 %
Monocytes Absolute: 0.44 10*3/uL (ref 0.36–0.77)
NRBC %: 0 % (ref 0.0–0.0)
NRBC Absolute: 0 10*3/uL (ref 0.00–2.00)
Neutrophil %: 55.8 %
Neutrophils Absolute: 3.56 10*3/uL (ref 1.50–7.95)
Platelets: 199 10*3/uL (ref 150–400)
RBC: 4.8 M/uL (ref 3.70–5.20)
RDW-CV: 13.1 % (ref 11.5–14.5)
RDW-SD: 42.5 fL (ref 35.0–51.0)
WBC: 6.4 10*3/uL (ref 4.0–11.0)

## 2022-02-07 LAB — TROPONIN I, HIGH SENSITIVITY: Troponin I, High Sensitivity: <6 ng/L

## 2022-02-07 LAB — POCT GLUCOSE: POCT Glucose: 114 mg/dL — ABNORMAL HIGH (ref 70–110)

## 2022-02-07 LAB — PTT: aPTT: 25 s

## 2022-02-07 LAB — PROTIME-INR
INR: 0.95
Protime: 9.9 s (ref 9.4–11.4)

## 2022-02-07 MED ORDER — iohexol (OMNIPaque) 350 mg iodine/mL solution 100 mL
350 | Freq: Once | INTRAVENOUS | Status: AC
Start: 2022-02-07 — End: 2022-02-07
  Administered 2022-02-07: 16:00:00 100 mL via INTRAVENOUS

## 2022-02-07 NOTE — ED Triage Notes (Addendum)
Per EMS pt  States feels weak dizzy and nausea since waking at 0420 today. Also states racing heart that has resolved. ? New onset AF. Negative stroke exam by EMS. States R side head pressure 9/10 since waking.

## 2022-02-07 NOTE — ED Procedure Note (Addendum)
Procedure  Critical Care    Performed by: Cyndi Lennert, MD  Authorized by: Cyndi Lennert, MD    Critical care provider statement:     Critical care time (minutes):  30    Critical care time was exclusive of:  Separately billable procedures and treating other patients    Critical care was necessary to treat or prevent imminent or life-threatening deterioration of the following conditions:  CNS failure or compromise (Code stroke)    Critical care was time spent personally by me on the following activities:  Re-evaluation of patient's condition, pulse oximetry, ordering and review of radiographic studies, ordering and review of laboratory studies, ordering and performing treatments and interventions, obtaining history from patient or surrogate, examination of patient, evaluation of patient's response to treatment and discussions with consultants (Reviewed case with radiology)    I assumed direction of critical care for this patient from another provider in my specialty: no                   Cyndi Lennert, MD  02/07/22 1327       Cyndi Lennert, MD  02/07/22 1328

## 2022-02-07 NOTE — ED Provider Notes (Signed)
HPI   Chief Complaint   Patient presents with   . Dizziness   . Chest Pain   . Nausea And Vomiting   . Weakness, Gen       Brought in by EMS from home.  Patient was complaining of a tremendous amount of pressure in the back of her head as well as her head feeling numb.  This started last night but was worse this morning.  She denies any sharp pain.  She denies any blurry or double vision, nasal congestion, trouble speaking, sore throat, fever, chills.  She states the pain is localized to the back of her head and radiates down to the top part of her neck.  She denies any anterior or lateral neck pain.  She denies any back pain, chest pain or pressure, shortness of breath or difficulty in breathing, abdominal pain, trouble using her arms or legs, trouble walking or balancing.  She is never had the symptoms before.  She reports she has no medical problems and only takes daily vitamins.  Her husband is bedside and endorses the above history.  EMS is bedside and reports that the patient was found to be in atrial fibrillation with a rapid ventricular rate and markedly hypertensive.  EMS did not initiate any medications.      History provided by:  Patient and spouse  History limited by:  Acuity of condition  Language interpreter used: No                  Glasgow Coma Scale Score: 15                                  Patient History   Past Medical History:   Diagnosis Date   . Chronic tension headaches    . Dyspepsia    . Hx of adenomatous polyp of colon    . Mood disorder (CMS-HCC)    . Osteoarthritis    . Osteoporosis    . Vitamin D deficiency      Past Surgical History:   Procedure Laterality Date   . COLONOSCOPY       No family history on file.  Social History     Tobacco Use   . Smoking status: Never     Passive exposure: Never   . Smokeless tobacco: Never   Vaping Use   . Vaping Use: Never used   Substance Use Topics   . Alcohol use: Never   . Drug use: Never       Review of Systems   Review of Systems   Unable to  perform ROS: Acuity of condition   Constitutional: Positive for fatigue (Patient does complain of feeling exhausted.). Negative for chills.   HENT: Negative for congestion, ear pain, sinus pain and sore throat.    Eyes: Negative for photophobia and pain.   Respiratory: Negative for cough, chest tightness and stridor.    Cardiovascular: Negative for chest pain and palpitations.   Gastrointestinal: Negative for abdominal pain, constipation, diarrhea, nausea and vomiting.   Genitourinary: Negative for dysuria and flank pain.   Musculoskeletal: Negative for back pain and neck pain.   Neurological: Positive for headaches (Less headache and more head pressure). Negative for tremors, syncope, light-headedness and numbness.       Physical Exam   ED Triage Vitals [02/07/22 0941]   Temp Pulse Resp BP   35.6 C (96.1 F) 88 20 (!)  150/77      SpO2 Temp src Heart Rate Source Patient Position   99 % -- -- --      BP Location FiO2 (%)     -- --       Physical Exam  Vitals and nursing note reviewed.   Constitutional:       General: She is in acute distress (Appears mildly uncomfortable).      Appearance: She is well-developed and normal weight. She is not ill-appearing, toxic-appearing or diaphoretic.   HENT:      Head: Normocephalic and atraumatic.      Salivary Glands: Right salivary gland is not tender. Left salivary gland is not tender.      Right Ear: Hearing normal.      Left Ear: Hearing normal.      Nose: Nose normal. No signs of injury or nasal tenderness.      Mouth/Throat:      Lips: Pink.      Mouth: Mucous membranes are moist.      Tongue: No lesions.      Pharynx: Oropharynx is clear.   Eyes:      General: Lids are normal. Vision grossly intact. No visual field deficit.     Extraocular Movements: Extraocular movements intact.      Right eye: Normal extraocular motion.      Left eye: Normal extraocular motion.      Conjunctiva/sclera: Conjunctivae normal.      Pupils: Pupils are equal, round, and reactive to light.    Neck:      Thyroid: No thyroid mass, thyromegaly or thyroid tenderness.      Vascular: Normal carotid pulses. No carotid bruit, hepatojugular reflux or JVD.      Trachea: Trachea and phonation normal. No tracheal deviation.   Cardiovascular:      Rate and Rhythm: Normal rate and regular rhythm.      Pulses: Normal pulses.           Radial pulses are 2+ on the right side and 2+ on the left side.      Heart sounds: Normal heart sounds. Heart sounds not distant. No murmur heard.     Comments: No evidence of atrial fibrillation on the cardiac monitor  Pulmonary:      Effort: Pulmonary effort is normal. No respiratory distress.      Breath sounds: Normal breath sounds.   Abdominal:      General: Abdomen is flat.      Palpations: Abdomen is soft.      Tenderness: There is no abdominal tenderness.   Musculoskeletal:         General: No swelling. Normal range of motion.      Cervical back: Full passive range of motion without pain, normal range of motion and neck supple. No edema, erythema, signs of trauma, rigidity, torticollis, tenderness or crepitus. No pain with movement. Normal range of motion.      Right lower leg: No tenderness. No edema.      Left lower leg: No tenderness. No edema.   Lymphadenopathy:      Cervical: No cervical adenopathy.      Right cervical: No superficial, deep or posterior cervical adenopathy.     Left cervical: No superficial or deep cervical adenopathy.   Skin:     General: Skin is warm and dry.      Capillary Refill: Capillary refill takes less than 2 seconds.      Coloration: Skin is not cyanotic  or pale.      Findings: No ecchymosis or erythema.   Neurological:      General: No focal deficit present.      Mental Status: She is alert and oriented to person, place, and time.      GCS: GCS eye subscore is 4. GCS verbal subscore is 5. GCS motor subscore is 6.      Cranial Nerves: Cranial nerves 2-12 are intact. No cranial nerve deficit, dysarthria or facial asymmetry.      Sensory: Sensation  is intact. No sensory deficit.      Motor: Motor function is intact. No weakness, tremor or abnormal muscle tone.      Coordination: Coordination is intact.      Comments: NIH stroke scale = 0   Psychiatric:         Mood and Affect: Mood normal.       CT HEAD WO CONTRAST   Final Result   No acute intracranial process.      I discussed these findings by phone with Dr. Delorise Jackson [facility] DO 02/07/2022 10:50 AM      CTA HEAD NECK W AND WO CONTRAST   Final Result   1. No evidence of vascular occlusion or significant stenosis.         ==================================   CAROTID STENOSIS REFERENCE - distal internal carotid artery diameter as the denominator for stenosis measurement:   MILD = <50% stenosis.   MODERATE = 50-69% stenosis.   SEVERE = 70-89% stenosis.   CRITICAL = 90-99% stenosis.   OCCLUDED = 100% stenosis.      Barnetta Chapel Mulqueen DO 02/07/2022 11:04 AM          Labs Reviewed   POCT GLUCOSE - Abnormal       Result Value    POCT Glucose 114 (*)    PROTIME-INR - Normal    Protime 9.9      INR 0.95     TROPONIN I, HIGH SENSITIVITY - Normal    Troponin I, High Sensitivity <6      Narrative:     Clinical correlation, HEART Score and shared decision making must be taken into account.                   For Chest Pain >3 Hours    Rule-Out Criteria              Single Value     0hr/1hr Delta Value  Female  <10ng/L*          <54ng/L AND delta <15ng/L  Female    <10ng/L*          <79ng/L AND delta <15ng/L    Cannot Rule-Out                            0hr/1hr Delta Value  Female    <54ng/L AND delta >15ng/L    >/= 54 AND delta </=15ng/L  Female      <79ng/L AND delta >15ng/L    >/= 79 AND delta </=15ng/L    Rule-In Criteria               Single Value   0hr/1hr Delta Value                 Female   >115ng/L       >/=54ng/L AND delta >/=15ng/L  Female     >115ng/L       >/=79ng/L AND delta >/=15ng/L           *Note: for Chest pain <3 hours 0hr/1hr is warranted for evaluation.     COMPREHENSIVE METABOLIC PANEL     Sodium 138      Potassium 3.7      Chloride 107      CO2 (Bicarbonate) 27      Anion Gap 4      BUN 17      Creatinine 0.67      eGFRcr 95      Glucose 126      Fasting? Unknown      Calcium 9.7      AST 22      ALT 40      Alkaline phosphatase 52      Protein, total 7.4      Albumin 3.8      Bilirubin, total 0.2     CBC W/DIFF    Narrative:     The following orders were created for panel order CBC and differential.  Procedure                               Abnormality         Status                     ---------                               -----------         ------                     CBC w/ Differential[[id number]]                              Final result                 Please view results for these tests on the individual orders.   PTT    aPTT 25     CBC WITH DIFFERENTIAL    WBC 6.4      RBC 4.80      Hemoglobin 14.2      Hematocrit 42.6      MCV 88.8      MCH 29.6      MCHC 33.3      RDW-CV 13.1      RDW-SD 42.5      Platelets 199      MPV 10.7      Neutrophil % 55.8      Lymphocyte % 34.8      Monocytes % 6.9      Eosinophils % 1.7      Basophils % 0.5      Immature Granulocytes % 0.3      NRBC % 0.0      Neutrophils Absolute 3.56      Lymphocytes Absolute 2.22      Monocytes Absolute 0.44      Eosinophils Absolute 0.11      Basophils Absolute 0.03      Immature Granulocytes Absolute 0.02      NRBC Absolute 0.00     POCT GLUCOSE       ED Course & MDM   ED Course as  of 02/07/22 1326   Tue Feb 07, 2022   2595 I was asked to see the patient in room 11.  She presented with an NIH stroke scale = 0 according to EMS however ER triage nursing was highly concerned that the patient may have an intracranial process as she is complaining of her whole head being numb with a tremendous amount of pressure in the back of her head and neck area.  Patient was immediately evaluated.   0945 I did review the rhythm strip provided by EMS.  They had concerns for atrial fibrillation.  There does not appear to be any atrial  fibrillation in my interpretation of their rhythm strip.   0953 Twelve-lead EKG independently interpreted by me as showing a normal sinus rhythm with a ventricular rate of 79.  No ST segment or T wave changes of concern.  Normal intervals.  No injury pattern.   1042 CT scan of the head independently interpreted by me as showing no intracranial hemorrhage, mass effect or midline shift.   1050 Radiology reached out to me.  We discussed the patient's official head CT findings as showing no acute concerns.   1105 CT scan of the head and neck with IV contrast reveals no acute concerns that explain the patient's symptoms.         Diagnoses as of 02/07/22 1326   Pressure in head       Medical Decision Making  It was immediately brought to my attention by nursing that there was concerns for possible head bleed in this patient considering her symptoms.  She had an NIH stroke scale = 0 nonetheless we immediately brought her into room 11 where I continued my assessment.  She states there is a tremendous amount of heaviness to the back of the head that it makes her whole head numb and she feels fatigued.    EMS did report this remote issue of atrial fibrillation however I did look at the rhythm strip and did not see any atrial fibrillation.  Patient has been on the heart monitor and I do not see any atrial fibrillation either.    Vital signs initially showed some mild accelerated hypertension but with time in the ER and no intervention, patient's blood pressure normalized.  Remaining vital signs generally unremarkable.    Physical exam is completely unremarkable.  NIH stroke scale = 0.  I see no central or peripheral neurologic deficits.  Heart and lung exam are unremarkable.  There is no concerns on HEENT exam.    Because I had concerns for possible intracranial hemorrhage, patient was deemed a code stroke.  IV access established.  Laboratory studies sent for analysis.  Patient was wheeled over to CT for an immediate CT scan  of the head as well as CT angiogram of the head and neck.    Radiology reached out to me and informed me that the patient's CT scan of the head was unremarkable for any acute findings.  CT angiogram of the head and neck were also unremarkable for any acute findings.    Twelve-lead EKG independently interpreted by me as showing no acute injury pattern.    Laboratory studies reviewed by me as being generally unremarkable.    Patient reassessed multiple times and is doing well.      Work up is unremarkable.  Patient understands that no exact cause was discovered to explain the reason for the symptoms however at this time there are no urgent or emergent findings  that warrent the need for further ED investigation, ED consultation or hospital admission. I do feel the patient is safe for discharge at this time.  I have explained to the patient to be vigilant for any further/additional signs or symptoms that may develop that may help to explain the cause of the original symptoms. Patient agrees to this. Patient told to seek medical attention immediately for any further concerns, changes in condition especially worsening of condition, or not getting better in the expected time thought to. Patient informed of evaluation results thus far.  I have answered all questions.  Patient told to inform primary care doctor today of this ER visit and to obtain follow-up visit as soon as possible or return to the ER for a check up if unable to see primary care doctor and still concerned.  Patient has been discharged.        Pressure in head: complicated acute illness or injury  Amount and/or Complexity of Data Reviewed  Independent Historian: spouse  Labs: ordered. Decision-making details documented in ED Course.  Radiology: ordered and independent interpretation performed. Decision-making details documented in ED Course.  ECG/medicine tests: ordered and independent interpretation performed. Decision-making details documented in ED  Course.      Risk  Prescription drug management.  Decision regarding hospitalization.                Cyndi Lennert, MD  02/07/22 1326

## 2022-02-07 NOTE — Discharge Instructions (Signed)
Your evaluated today for this head pressure.  You had CT scans of your head as well as your neck.  We did not find any acute concerns.  We also did laboratory studies, EKG and x-rays.  All these studies turned out well.  No concerns were found.  Your work-up was unremarkable today for any acute findings.  Please understand that no exact cause was discovered to explain the reason for the symptoms however at this time I see no urgent or emergent findings that warrent the need for further ED investigation, ED consultation or hospital admission. I do feel you are safe for discharge at this time.  I have explained to you the need to be vigilant for any further/additional signs or symptoms that may develop that may help to explain the cause of the original symptoms.  Please seek medical attention immediately for any further concerns, changes in condition especially worsening of condition, or not getting better in the expected time thought to.   I have answered all your questions but it you have any more please call back.  Please inform your primary care doctor today of this ER visit and to obtain follow-up visit as soon as possible or return to the ER for a check up if unable to see primary care doctor and still concerned. Thank you for the oppertunity. Have a great day.

## 2022-02-07 NOTE — ED Notes (Signed)
Discharge paperwork reviewed with patient, all questions answered. Patient verbalized understanding.        Kellie Moor, RN  02/07/22 1321

## 2022-02-08 NOTE — Telephone Encounter (Signed)
Patient is calling in stating she was seen in the emergency room yesterday 1/31 because she woke up nauseas and dizzy and her blood pressure was 190. Husband called 91.  She would like to be seen by Dr. Harley Hallmark today to follow up. Please advise    Best call back number is   [phone]

## 2022-02-08 NOTE — Telephone Encounter (Addendum)
Was assessed in ER, anything acute was ruled out.      Did she declines an appt. Sooner with Dr. Harley Hallmark?    She could see another provider if she needs a different date or time or wants something sooner.

## 2022-02-08 NOTE — ED Notes (Signed)
Call made.  Pt states is feeling weak but better since yesterday.  Decrease appetite.  Pushing fluids.  Will follow up per discharge instructions.     Rica Mast, RN  02/08/22 [phone]

## 2022-02-08 NOTE — Telephone Encounter (Signed)
Talk to patient and schedule for 2/21     Offered early appointment patient denied

## 2022-03-01 ENCOUNTER — Encounter: Payer: MEDICARE | Attending: Family Medicine | Primary: Family Medicine

## 2022-03-04 LAB — BASIC METABOLIC PANEL
Anion Gap: 18 mmol/L (ref 10.0–18.0)
BUN/Creat Ratio: 23 (ref 12–28)
BUN: 16 mg/dL (ref 8–27)
Calcium: 9.8 mg/dL (ref 8.7–10.3)
Carbon Dioxide: 23 mmol/L (ref 20–29)
Chloride: 103 mmol/L (ref 96–106)
Creat: 0.69 mg/dL (ref 0.57–1.00)
Glucose: 111 mg/dL — ABNORMAL HIGH (ref 70–99)
Potassium: 4.2 mmol/L (ref 3.5–5.2)
Sodium: 144 mmol/L (ref 134–144)
eGFR: 94 mL/min/{1.73_m2} (ref >59)

## 2022-03-04 LAB — ALBUMIN: Albumin: 4.3 g/dL (ref 3.9–4.9)

## 2022-03-04 LAB — VITAMIN D 25 HYDROXY: Vitamin D, 25-Hydroxy: 47.4 ng/mL (ref 30.0–100.0)

## 2022-03-16 ENCOUNTER — Ambulatory Visit: Admit: 2022-03-16 | Discharge: 2022-03-16 | Payer: MEDICARE | Attending: Internal Medicine | Primary: Family Medicine

## 2022-03-16 DIAGNOSIS — E559 Vitamin D deficiency, unspecified: Secondary | ICD-10-CM

## 2022-03-16 MED ORDER — alendronate (Fosamax) 70 mg tablet
70 | ORAL_TABLET | ORAL | 3 refills | Status: AC
Start: 2022-03-16 — End: ?

## 2022-03-16 NOTE — Progress Notes (Signed)
[facility]  [address]  [place] [address]  [phone]    Endocrinology Clinic Note    Reason for Referral:  Referring Physician: Prentice Docker, MD    History of present illness:  [name] is a 67 y.o. female with a history of GERD referred in endocrine consultation for osteoporosis at the request of PCP.   She was on alendronate from 2009 to 03-2013.She has been on drug holiday till 03-2018.  Had been on risedronate before that.     Restarted fosamax 70 mg weekly 2020 by PCP and has been on it for 3 years with improvement in BMD. misunderstood and stopped fosamax nov 2023  No new fractures/falls    BMD  T score spine  FN   Total hip  2023           -2.8      -1.7    -1.4  2021           -3        -1.9     -1.2  2019           -3.3      -2.3    -1.7  2016           -2.9      -1.7  She is on  vitamin D 1000 IU daily., no falls/fractures  02-2017: BMD reviewed and worsening scores, spine -3.3, Hip -2.3, We had discussed doing Reclast last year but she was traveling to [place] and did not reschedule her Reclast.  No new fractures or falls.Most recently we discussed doing Prolia but it turns out she does not have drug coverage.  Patient went to [facility] and she would told that Fosamax will be covered.    The patient reports puberty occurred at age 85, similar to peers. Periods were regular.  She is G5P4.  Menopause occurred at age 77.  Was briefly on HRT at that time.    At around age 8 she was diagnosed with osteoporosis.  Her dairy intake is minimal: occasional yogurt, has almond milk every night.     She reports no extended period of prednisone use. She has no history of kidney stones.  She reports no constipation.   No polyuria.          Assessment/Plan:    [name] is a 67 y.o. female with a history of GERD referred in endocrine consultation for osteoporosis at the request of PCP.   She was on alendronate from 2009 to 03-2013.  Had been on risedronate before  that. 09-2014 BMD with T scores -2.9 in AP spine and -1.7 in left femur.02-2017 spine -3.3, Hip -2.3 , PC we did follow bone density in 2021 with just 1 year of Fosamax and was already improving as above She has no history of fracture.  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   She was on alendronate for 6 years.  Her spinal BMD has declined since 2014 but probably not significant--it is still higher that it was compared to 2011 as bisphos worked. Hip BMD now worst.  We discussed options of both Reclast versus Prolia .  She had initially wanted to do Reclast but is very afraid of side effects and today wanted Prolia.  But it appears that she does not have a drug coverage.  She has been oral Fosamax again since 03-2019, F/U BMD 2023  Improved.will do  total 5 years of oral bisphos. Misunderstood and stopped nov 2023 and asked to restart   f/u BMD 03-2023, labs     In the meantime, discussed goal of 1200 mg daily calcium via diet or pill. Gets vitamin D via citracal-D and MVI. Recent kidney function, calcium and vitamin D          Thank you for Involving in this patient's care. Please do not hesitate to contact with questions or concerns      Mary Sella, MD     Pertinent lab review:    Lab Results   Component Value Date    CALCIUM 9.8 03/03/2022      No results found for: "TSH15", "TSH", "TSHBASE", "TSHWREFLT4", "TSHWREFL", "EXTTSH3"   Lab Results   Component Value Date    CALCIUM 9.8 03/03/2022    CALCIUM 9.7 02/07/2022    CALCIUM 9.8 03/10/2021    CALCIUM 9.4 09/20/2020    VITD25 47.4 03/03/2022    VITD25 48 03/10/2021    ALBUMIN 4.3 03/03/2022    ALBUMIN 3.8 02/07/2022        BMD:    === 03/09/21 ===    BD DEXA AXIAL    - Impression -  Osteoporosis        Julien Nordmann 03/10/2021 7:45 AM     Medical History:    Past Medical History:   Diagnosis Date   . Chronic tension headaches    . Dyspepsia    . Hx of adenomatous polyp of colon    . Mood  disorder (CMS-HCC)    . Osteoarthritis    . Osteoporosis    . Vitamin D deficiency          Surgical History:    Past Surgical History:   Procedure Laterality Date   . COLONOSCOPY            Family History:    @BIMFAMHISTORY @       Social History:    No valid surgical or medical questions entered.        Active medical problems:    Problem List Items Addressed This Visit    None          Current medications:      Current Outpatient Medications:   .  alendronate (Fosamax) 70 mg tablet, TAKE 1 TABLET BY MOUTH ONE TIME PER WEEK, Disp: 12 tablet, Rfl: 2  .  cal-D3-mag11-zinc-cop-mang-bor 600 mg calcium- 800 unit-50 mg tablet, Take 1 tablet by mouth in the morning., Disp: 90 each, Rfl: 0  .  cetirizine (ZyrTEC) 10 mg tablet, Take 1 tablet (10 mg) by mouth once daily., Disp: 90 tablet, Rfl: 2  .  lutein 6 mg capsule, Take 6 mg by mouth., Disp: , Rfl:   .  multivitamin (Multi-Delyn) liquid oral solution, Take 5 mL by mouth in the morning., Disp: , Rfl:   .  omeprazole (PriLOSEC) 20 mg DR capsule, Take 1 capsule (20 mg) by mouth if needed each day (dyspepsia)., Disp: 90 capsule, Rfl: 1       Allergies:    Allergies   Allergen Reactions   . Influenza Vaccine Tr-S 11 (Pf) Swelling   . Pollen Extracts    . Sulfur    . Amitriptyline Palpitations   . Boostrix Tdap [Diphth,Pertus(Acell),Tetanus] Rash   . Flu Virus Vaccine Tv 2015-16 (18 Yr And Up),Recomb Rash   . Penicillins Hives and Rash   . Sulfa (Sulfonamide Antibiotics) Rash  ROS:    No palpitations, no tremor, no heat intolerance. No cold intolerance.  No diarrhea or constipation. No change in hair or skin. No dysphagia, no odynophagia, no solid food or pill dysphagia. No diplopia/blurry vision.No fevers/chills.No change in voice.No anterior neck pressure. No increase in neck size.No wheeze or DOE.No rash.No edema. No cough.No anxiety/Depression.Regular menses Rest of ROS negative or non-contributory.       Physical Exam:    Visit Vitals  BP 135/72 (BP Location:  Left arm, Patient Position: Sitting, BP Cuff Size: Adult)   Pulse 71      XIP:JASNK alert no acute distress  HEENT: head atraumatic, normocephalic, no redness in eyes no discharge from nose  SKIN: facial skin does not show any cyanosis  RESP: speaking in full sentences, no increased respiratory effort seen  MSK: normal ROM in fingers  Neuro: facial nerves intact  Pysch: appearance, behavior and attitude-pleasant, cooperative and groomed                   This note has been created with voice recognition software.  While this note has been edited for accuracy, this software periodically misinterprets speech resulting in errors that might not have been caught in editing.  In the event an unusual error is found in this record, please notify us at  [phone] to resolve the error.

## 2022-05-02 ENCOUNTER — Ambulatory Visit: Admit: 2022-05-02 | Discharge: 2022-05-02 | Payer: MEDICARE | Attending: Internal Medicine | Primary: Family Medicine

## 2022-05-02 DIAGNOSIS — R1011 Right upper quadrant pain: Secondary | ICD-10-CM

## 2022-05-02 NOTE — Assessment & Plan Note (Deleted)
05/02/22 BP 146/90 high.

## 2022-05-02 NOTE — Progress Notes (Signed)
[facility]  [facility] [place]  [address]  [place] [address]  Dept: [phone]  Dept Fax: [phone]     Patient ID: [name] is a 67 y.o. female who presents for Nausea and Abdominal Pain.    Subjective   Pt complains of nausea and R sided burning abd pain, burping, onset 4 days ago (04/28/22). Pain now radiates around to the R back/flank. Pain is constant. She was only able to consumes crackers and tea yesterday. She notes except for spicy foods, she notes food does not affect sx. She notes some intermittent swelling "like I was pregnant". She denies vomiting, UTI sx, rash. She takes omeprazole daily. Alka Seltzer was ineffective. She has not tried [employer].      Patient Active Problem List   Diagnosis   . Osteoporosis   . Osteoarthritis   . Benign neoplasm of colon, unspecified   . Elevated blood pressure reading without diagnosis of hypertension   . Vision disorder   . Functional dyspepsia   . Vitamin D deficiency   . Chronic tension headache   . Allergic rhinitis   . RUQ pain     Current Outpatient Medications   Medication Instructions   . alendronate (Fosamax) 70 mg tablet TAKE 1 TABLET BY MOUTH ONE TIME PER WEEK   . cal-D3-mag11-zinc-cop-mang-bor 600 mg calcium- 800 unit-50 mg tablet 1 tablet, oral, Daily RT   . cetirizine (ZYRTEC) 10 mg, oral, Daily   . lutein 6 mg, oral   . multivitamin (Multi-Delyn) liquid oral solution 5 mL, oral, Daily RT   . omeprazole (PRILOSEC) 20 mg, oral, Daily PRN     Allergies   Allergen Reactions   . Influenza Vaccine Tr-S 11 (Pf) Swelling   . Pollen Extracts    . Sulfur    . Amitriptyline Palpitations   . Boostrix Tdap [Diphth,Pertus(Acell),Tetanus] Rash   . Flu Virus Vaccine Tv 2015-16 (18 Yr And Up),Recomb Rash   . Penicillins Hives and Rash   . Sulfa (Sulfonamide Antibiotics) Rash     Past Medical History:   Diagnosis Date   . Chronic tension headaches    .  Dyspepsia    . Hx of adenomatous polyp of colon    . Mood disorder (CMS-HCC)    . Osteoarthritis    . Osteoporosis    . Vitamin D deficiency      Past Surgical History:   Procedure Laterality Date   . COLONOSCOPY       No family history on file.  Social History     Tobacco Use   . Smoking status: Never     Passive exposure: Never   . Smokeless tobacco: Never   Vaping Use   . Vaping Use: Never used   Substance Use Topics   . Alcohol use: Never   . Drug use: Never     ROS  Gen: Denies fever, chills, sweats, fatigue, weakness, malaise, weight loss  Eyes: Denies visual change, eye pain  ENT: Denies earache, decreased hearing, sore throat  CV: Denies chest pain or palpitations  Resp: Denies cough, shortness of breath  GI: Nausea, back/flank pain. Denies vomiting, diarrhea, constipation.  MS: R abd pain, wraps to R flank. Denies muscle aches or weakness, joint pain  Skin: Denies rash, suspicious lesions.  Psychiatric: Denies anxiety, depression    Objective   Visit Vitals  BP (!) 146/90 (BP Location: Right arm, Patient Position: Sitting,  BP Cuff Size: Adult)   Pulse 88   Temp 36.3 C (97.3 F) (Temporal)   Ht 1.467 m   Wt 57.3 kg   LMP  (LMP Unknown)   SpO2 98%   BMI 26.64 kg/m   OB Status Postmenopausal   BSA 1.53 m     Physical Exam  Gen: alert, cooperative, NAD  Head: NCAT  Eyes: EOMI, conjunctiva and sclera clear  Mouth: OP clear  Heart: RRR, no M/R/G  Lungs: CTAB  MS: no CVA tenderness.  Skin: intact without lesions or rashes  Psych: alert and cooperative; normal mood and affect; normal attention span    Assessment/Plan   [name] was seen today for nausea and abdominal pain.  RUQ pain  Assessment & Plan:  Unclear what is causing her symptoms. Will order urine studies to rule out kidney stone or infection. Check amylase and lipase. It does not seem characteristic of biliary disease except for the location, but would order ultrasound if her pain persists.  Will check CMP and CBC as well. In the meanwhile, continue  omeprazole. Consider Pepto Bismol, discussed that it may make her stool black. If sx do not improve or worsen will refer to GI.  Orders:  -     CBC; Future  -     Comprehensive metabolic panel; Future  -     Amylase; Future  -     Lipase; Future  -     US ABDOMEN COMPLETE; Future  -     Urinalysis with Reflex Microscopic; Future  -     Urine culture; Future      By signing my name below, I, Ebony Hail, attest that this documentation has been prepared under the direction and in the presence of Ernestine Conrad MD.   Electronically Signed: Ebony Hail  05/02/22  1:40 PM    All medical record entries made by the Scribe were at my direction and personally dictated by me. I reviewed the chart and agree that the record accurately reflects my personal performance of the history, physical examination, assessment & plan. Ernestine Conrad, MD

## 2022-05-02 NOTE — Assessment & Plan Note (Addendum)
Unclear what is causing her symptoms. Will order urine studies to rule out kidney stone or infection. Check amylase and lipase. It does not seem characteristic of biliary disease except for the location, but would order ultrasound if her pain persists.  Will check CMP and CBC as well. In the meanwhile, continue omeprazole. Consider Pepto Bismol, discussed that it may make her stool black. If sx do not improve or worsen will refer to GI.

## 2022-05-02 NOTE — Patient Instructions (Signed)
Please call our central scheduling office to schedule your ultrasound test: [phone].

## 2022-05-03 ENCOUNTER — Inpatient Hospital Stay: Admit: 2022-05-03 | Payer: MEDICARE | Primary: Family Medicine

## 2022-05-03 ENCOUNTER — Encounter: Payer: MEDICARE | Attending: Family Medicine | Primary: Family Medicine

## 2022-05-03 DIAGNOSIS — R1011 Right upper quadrant pain: Secondary | ICD-10-CM

## 2022-05-03 LAB — COMPREHENSIVE METABOLIC PANEL
A/G Ratio: 1.5 (ref 1.2–2.2)
ALT: 29 IU/L (ref 0–32)
AST: 29 IU/L (ref 0–40)
Albumin: 4.3 g/dL (ref 3.9–4.9)
Alk Phosphatase: 72 IU/L (ref 44–121)
Anion Gap: 15 mmol/L (ref 10.0–18.0)
BUN/Creat Ratio: 18 (ref 12–28)
BUN: 12 mg/dL (ref 8–27)
Bili Total: 0.3 mg/dL (ref 0.0–1.2)
Calcium: 9.9 mg/dL (ref 8.7–10.3)
Carbon Dioxide: 24 mmol/L (ref 20–29)
Chloride: 99 mmol/L (ref 96–106)
Creat: 0.67 mg/dL (ref 0.57–1.00)
Globulin Total: 2.9 g/dL (ref 1.5–4.5)
Glucose: 105 mg/dL — ABNORMAL HIGH (ref 70–99)
Potassium: 4.3 mmol/L (ref 3.5–5.2)
Protein Total: 7.2 g/dL (ref 6.0–8.5)
Sodium: 138 mmol/L (ref 134–144)
eGFR: 95 mL/min/{1.73_m2} (ref >59)

## 2022-05-03 LAB — CBC
Hct: 42.2 % (ref 34.0–46.6)
Hgb: 14.1 g/dL (ref 11.1–15.9)
MCH: 29.4 pg (ref 26.6–33.0)
MCHC: 33.4 g/dL (ref 31.5–35.7)
MCV: 88 fL (ref 79–97)
Platelets: 222 10*3/uL (ref 150–450)
RBC: 4.79 x10E6/uL (ref 3.77–5.28)
RDW: 12.4 % (ref 11.7–15.4)
WBC: 6.7 10*3/uL (ref 3.4–10.8)

## 2022-05-03 LAB — URINALYSIS
Bilirubin Ur: NEGATIVE
Glucose Ur: NEGATIVE
Ketones Ur: NEGATIVE
Nitrite Ur: NEGATIVE
Occult Blood Ur: NEGATIVE
Protein Ur: NEGATIVE
Specific Gravity Ur: 1.011 (ref 1.005–1.030)
Urobilinogen,Semi-Qn Ur: 0.2 mg/dL (ref 0.2–1.0)
WBC Esterase Ur: NEGATIVE
pH Ur: 6.5 (ref 5.0–7.5)

## 2022-05-03 LAB — ALBUMIN: Albumin: 4.4 g/dL (ref 3.9–4.9)

## 2022-05-03 LAB — CALCIUM, TOTAL: Calcium: 9.9 mg/dL (ref 8.7–10.3)

## 2022-05-04 LAB — AMYLASE: Amylase: 64 U/L (ref 31–110)

## 2022-05-04 LAB — LIPASE: Lipase: 30 U/L (ref 14–72)

## 2022-05-04 LAB — REFLEXIVE URINE CULTURE

## 2022-05-04 LAB — URINE CULTURE

## 2022-05-16 ENCOUNTER — Ambulatory Visit
Admit: 2022-05-16 | Discharge: 2022-05-16 | Payer: MEDICARE | Attending: Obstetrics & Gynecology | Primary: Family Medicine

## 2022-05-16 DIAGNOSIS — N10 Acute tubulo-interstitial nephritis: Secondary | ICD-10-CM

## 2022-05-16 MED ORDER — nitrofurantoin, macrocrystal-monohydrate, (Macrobid) 100 mg capsule
100 | ORAL_CAPSULE | Freq: Two times a day (BID) | ORAL | 0 refills | 6.00000 days | Status: AC
Start: 2022-05-16 — End: 2022-05-26

## 2022-05-16 NOTE — Progress Notes (Addendum)
[facility] [place]  [address] [place] [address]  [phone]    Patient ID: [name] is a 67 y.o. female who presents for NEW PT for last two days burning when urinating , today notice a bump in vagina.      Benafiber2 tsp TID+colace 1 tab bid+add 30cc MOM if no BM 2 days for constipation    She is a Conservation officer, nature   She hold her urine while she is working and advise to stop this Counsellor offered and declined  Primary care doctor:  Geophysicist/field seismologist  Neg for Tobacco, Drugs, vape and occ ETOH, does SBE     LMP  Current medication:  Allergy  Medical history -  Surgical history -  Family history  -  Work     Colonoscopy   In 2023     next colonoscopy expected to be in  Mammogram uptdap  Last pap   @ 61 negative period pt  Last HPV -  Any history of abnormal pap:  N told that she does not need pap by her previous md  Marital status: MW G2P4, HT -36-11  Lives:  husb  Sexually active  N     HPV vaccine:  no  Emergency contact    myriad faminly history screening done on 05/16/2022  plan: she does not meet current guidelines for this test. She declined this test today.     HPI        Review of Systems   Constitutional: Negative.    HENT: Negative.    Eyes: Negative.    Respiratory: Negative.    Cardiovascular: Negative.    Gastrointestinal: Negative.    Endocrine: Negative.    Genitourinary: Negative.    Musculoskeletal: Negative.    Skin: Negative.    Allergic/Immunologic: Negative.    Neurological: Negative.         Objective   Visit Vitals  BP (!) 150/80   Ht 1.473 m   Wt 57.6 kg   LMP  (LMP Unknown)   BMI 26.54 kg/m   OB Status Postmenopausal   BSA 1.54 m   advise follow up primary care doctor - she checks her blood pressure daily        02/07/2022    12:30 PM 02/07/2022    12:45 PM 02/07/2022     1:00 PM 02/07/2022     1:15 PM 03/16/2022     9:00 AM 05/02/2022    10:37 AM 05/16/2022     2:34 PM   Vitals   Systolic 136 130 664 138 135 146 150   Diastolic 60 61  72 64 72 90 80   Pulse 71 73 71 72 71 88    Temp      36.3 C (97.3 F)    Resp 14 16 14 16       Height (in)     1.467 m 1.467 m 1.473 m   Weight (lb)     126.4 126.4 127   SpO2 99 % 99 % 99 % 99 %  98 %    BMI     26.65 kg/m2 26.64 kg/m2 26.54 kg/m2   BSA (m2)     1.53 m2 1.53 m2 1.54 m2   Visit Report     Report Report Report       Physical Exam      Abdomen: soft, non tender with no referred or palpational  pain     Pelvic examination:    Vulva: Normal appearance, normal hair distribution, no lesions or masses.     Urethra: Normal, no masses, non-tender, no discharge.     Vagina: Normal, rugated, physiologic discharge, no lesions, no masses, adequate pelvic support. Moderate cystocele    Cervix: Normal, no motion tenderness, no lesions.     Uterus: smooth, mobile, non-tender, adequate support, firm, no prolapse, average size. Slight decent    Adnexa: Normal, no masses, mobile, nontender.     Rectum: EXTERNAL HEMORRHOIDS.     Assessment/Plan   Acute pyelonephritis  -     nitrofurantoin, macrocrystal-monohydrate, (Macrobid) 100 mg capsule; Take 1 capsule (100 mg) by mouth twice daily for 10 days.  -     Urine culture  Dysuria  -     Urinalysis with Reflex Microscopic  -     Urine culture  Female cystocele  -     US TRANSVAGINAL NON OB; Future  -     Urine culture  Other orders  -     Urinalysis with Reflex Microscopic  -     REFLEXIVE URINE CULTURE    Moderate cystocele due to work condition  Will do ultrasound to rule out other gyn pathology    Advice her to make an annual gynecological examination in one year before leaving office today    Patient was advised to check with her insurance company to sure that all tests ordered today are covered by her insurance company before heading for the tests.  Start Macrobid 100 mg twice a day for 10 days  Advise benafiber2 tsp TID+colace 1 tab bid+add 30cc MOM if no BM 2 days  to avoid constipation  Need transvaginal ultrasound   Advice to return to office for result

## 2022-05-16 NOTE — Patient Instructions (Signed)
Start Macrobid 100 mg twice a day for 10 days  Advise benafiber2 tsp TID+colace 1 tab bid+add 30cc MOM if no BM 2 days  to avoid constipation  Need transvaginal ultrasound   Advice to return to office for result

## 2022-05-16 NOTE — Telephone Encounter (Signed)
Patient says she went to wipe herself this morning and noticed a "cyst/bubble  like inside of her vagina", patient requesting callback in regards to being squeezed in for an apt tomorrow if possible or to discuss a plan of care, says is going to work at 2:00pm so if not able to be reached a VM can be left please       Callback#: [phone]  Alt Tele#:  [phone]

## 2022-05-16 NOTE — Telephone Encounter (Signed)
I spoke to patient and she will see Dr. Dorna Bloom today at 1:40pm. I did let patient know she was fit in for an appointment and she will arrive there in 15 mins. Patient is aware it may be a wait since it was an urgent appointment.

## 2022-05-17 LAB — URINALYSIS
Bilirubin Ur: NEGATIVE
Glucose Ur: NEGATIVE
Ketones Ur: NEGATIVE
Nitrite Ur: NEGATIVE
Occult Blood Ur: NEGATIVE
Protein Ur: NEGATIVE
Specific Gravity Ur: 1.005 (ref 1.005–1.030)
Urobilinogen,Semi-Qn Ur: 0.2 mg/dL (ref 0.2–1.0)
WBC Esterase Ur: NEGATIVE
pH Ur: 7.5 (ref 5.0–7.5)

## 2022-05-18 LAB — REFLEXIVE URINE CULTURE: Ur Cult 1: NO GROWTH

## 2022-05-18 LAB — URINE CULTURE

## 2022-05-22 NOTE — Telephone Encounter (Signed)
Labs and ultrasound were normal. The abdominal pain is better but she is having different pain for which she is seeing GYN. She has an ultrasound scheduled tomorrow and will follow up with GYN.

## 2022-05-23 ENCOUNTER — Inpatient Hospital Stay: Admit: 2022-05-23 | Payer: MEDICARE | Primary: Family Medicine

## 2022-05-23 DIAGNOSIS — N8110 Cystocele, unspecified: Secondary | ICD-10-CM

## 2022-05-24 ENCOUNTER — Inpatient Hospital Stay: Admit: 2022-05-24 | Discharge: 2022-05-25 | Disposition: A | Discharge status: Home / Self Care | Payer: MEDICARE

## 2022-05-24 ENCOUNTER — Ambulatory Visit
Admit: 2022-05-24 | Discharge: 2022-05-24 | Payer: MEDICARE | Attending: Obstetrics & Gynecology | Primary: Family Medicine

## 2022-05-24 ENCOUNTER — Emergency Department: Admit: 2022-05-24 | Payer: MEDICARE | Primary: Family Medicine

## 2022-05-24 DIAGNOSIS — R1084 Generalized abdominal pain: Secondary | ICD-10-CM

## 2022-05-24 DIAGNOSIS — R1031 Right lower quadrant pain: Secondary | ICD-10-CM

## 2022-05-24 DIAGNOSIS — R1030 Lower abdominal pain, unspecified: Secondary | ICD-10-CM

## 2022-05-24 LAB — URINALYSIS REFLEX TO CULTURE
Bilirubin, Ur: NEGATIVE
Blood, Ur: NEGATIVE
Glucose,Ur: NEGATIVE mg/dL
Ketones, Ur: NEGATIVE mg/dL
Leukocyte Esterase, Ur: NEGATIVE WBC/uL
Nitrite, Ur: NEGATIVE
Protein,Ur: NEGATIVE mg/dL
Specific Gravity, Ur: 1.01 (ref 1.005–1.030)
Urobilinogen, Ur: 0.2 U/dL (ref 0.2–1.0)
pH, Ur: 7.5 (ref 5.0–8.0)

## 2022-05-24 LAB — CBC WITH DIFFERENTIAL
Basophils %: 0.5 %
Basophils Absolute: 0.03 10*3/uL (ref 0.00–0.22)
Eosinophils %: 1.7 %
Eosinophils Absolute: 0.11 10*3/uL (ref 0.00–0.50)
Hematocrit: 40.6 % (ref 32.0–47.0)
Hemoglobin: 13.3 g/dL (ref 11.0–16.0)
Immature Granulocytes %: 0.2 %
Immature Granulocytes Absolute: 0.01 10*3/uL (ref 0.00–0.10)
Lymphocyte %: 41.3 %
Lymphocytes Absolute: 2.65 10*3/uL (ref 0.70–4.00)
MCH: 28.8 pg (ref 26.0–34.0)
MCHC: 32.8 g/dL (ref 31.0–37.0)
MCV: 87.9 fL (ref 80.0–100.0)
MPV: 10.5 fL (ref 9.1–12.4)
Monocytes %: 7.2 %
Monocytes Absolute: 0.46 10*3/uL (ref 0.36–0.77)
NRBC %: 0 % (ref 0.0–0.0)
NRBC Absolute: 0 10*3/uL (ref 0.00–2.00)
Neutrophil %: 49.1 %
Neutrophils Absolute: 3.16 10*3/uL (ref 1.50–7.95)
Platelets: 192 10*3/uL (ref 150–400)
RBC: 4.62 M/uL (ref 3.70–5.20)
RDW-CV: 12.9 % (ref 11.5–14.5)
RDW-SD: 41.5 fL (ref 35.0–51.0)
WBC: 6.4 10*3/uL (ref 4.0–11.0)

## 2022-05-24 LAB — COMPREHENSIVE METABOLIC PANEL
ALT: 30 U/L (ref 0–55)
AST: 22 U/L (ref 6–42)
Albumin: 3.6 g/dL (ref 3.2–5.0)
Alkaline phosphatase: 55 U/L (ref 30–130)
Anion Gap: 5 mmol/L (ref 3–14)
BUN: 10 mg/dL (ref 6–24)
Bilirubin, total: 0.3 mg/dL (ref 0.2–1.2)
CO2 (Bicarbonate): 30 mmol/L (ref 20–32)
Calcium: 9.3 mg/dL (ref 8.5–10.5)
Chloride: 105 mmol/L (ref 98–110)
Creatinine: 0.66 mg/dL (ref 0.55–1.30)
Glucose: 106 mg/dL (ref 70–139)
Potassium: 3.8 mmol/L (ref 3.6–5.2)
Protein, total: 7.1 g/dL (ref 6.0–8.4)
Sodium: 140 mmol/L (ref 135–146)
eGFRcr: 95 mL/min/{1.73_m2} (ref >=60)

## 2022-05-24 LAB — LIPASE: Lipase: 41 U/L (ref 13–75)

## 2022-05-24 MED ORDER — morphine injection 4 mg
4 | Freq: Once | INTRAVENOUS | Status: DC
Start: 2022-05-24 — End: 2022-05-24

## 2022-05-24 MED ORDER — sodium chloride 0.9 % flush 10 mL
INTRAMUSCULAR | Status: DC | PRN
Start: 2022-05-24 — End: 2022-05-24

## 2022-05-24 MED ORDER — iohexol (OMNIPaque) 350 mg iodine/mL solution 100 mL
350 | Freq: Once | INTRAVENOUS | Status: AC
Start: 2022-05-24 — End: 2022-05-24
  Administered 2022-05-24: 22:00:00 100 mL via INTRAVENOUS

## 2022-05-24 MED ORDER — ketorolac (Toradol) injection 15 mg
15 | Freq: Once | INTRAMUSCULAR | Status: AC
Start: 2022-05-24 — End: 2022-05-24
  Administered 2022-05-24: 15 mg via INTRAVENOUS

## 2022-05-24 MED FILL — MORPHINE 4 MG/ML INTRAVENOUS SOLUTION WRAPPER: 4 4 mg/mL | INTRAVENOUS | Qty: 1

## 2022-05-24 MED FILL — KETOROLAC 15 MG/ML INJECTION SOLUTION: 15 15 mg/mL | INTRAMUSCULAR | Qty: 1

## 2022-05-24 NOTE — Progress Notes (Signed)
NAME:[name]   DOB:[birth year]   XLK:[id number]    DATE OF VISIT: 05/24/2022    Chaperone offered - declined  Primary care doctor:    Problem: visit  1. CHIEF COMPLAINT: PT WAS GIVEN MACROBID LAST WEEK FOR A BLADDER INFECTION.  2. PT STILL HAS SOME LEFT BUT HAS MUCH MORE PAIN  3. PT HAD U/S DONE YESTERDAY      Since Sunday   She had BM on bendayl     This is a new gyn problem    Other c/o: none  LMP:No LMP recorded (lmp unknown). Patient is postmenopausal.          Last PE with pcp:     last urine with pcp:      Review of Systems   Constitutional: Negative.    HENT: Negative.    Eyes: Negative.    Respiratory: Negative.    Cardiovascular: Negative.    Gastrointestinal: Negative.    Endocrine: Negative.    Genitourinary: Negative.    Musculoskeletal: Negative.    Skin: Negative.    Allergic/Immunologic: Negative.    Neurological: Negative.         OBJECTIVE VS  BP 134/80   Ht 1.473 m   Wt 56.7 kg   LMP  (LMP Unknown)   BMI 26.13 kg/m      Patient Active Problem List   Diagnosis   . Osteoporosis   . Osteoarthritis   . Benign neoplasm of colon, unspecified   . Elevated blood pressure reading without diagnosis of hypertension   . Vision disorder   . Functional dyspepsia   . Vitamin D deficiency   . Chronic tension headache   . Allergic rhinitis   . RUQ pain        Current Outpatient Medications   Medication Sig Dispense Refill   . alendronate (Fosamax) 70 mg tablet TAKE 1 TABLET BY MOUTH ONE TIME PER WEEK 12 tablet 3   . cal-D3-mag11-zinc-cop-mang-bor 600 mg calcium- 800 unit-50 mg tablet Take 1 tablet by mouth in the morning. 90 each 0   . cetirizine (ZyrTEC) 10 mg tablet Take 1 tablet (10 mg) by mouth once daily. 90 tablet 2   . lutein 6 mg capsule Take 6 mg by mouth.     . multivitamin (Multi-Delyn) liquid oral solution Take 5 mL by mouth in the morning.     . nitrofurantoin, macrocrystal-monohydrate, (Macrobid) 100 mg capsule Take 1 capsule (100 mg) by mouth twice daily for 10 days. 20 capsule 0   . omeprazole  (PriLOSEC) 20 mg DR capsule Take 1 capsule (20 mg) by mouth if needed each day (dyspepsia). 90 capsule 1     No current facility-administered medications for this visit.          Physical Exam  Abdominal:          Comments: Slight distend in the lower abdomen   Decrease bowel sound      SE- moderated cystocele    Assessment/Plan      Generalized abdominal pain  Female cystocele  since Sunday  Abdomen slight distended  Probably GI  Advise to go to ER now for a further evaluation      No orders of the defined types were placed in this encounter.       Instruction:  Advice to return to office next week

## 2022-05-24 NOTE — ED Provider Notes (Signed)
Emergency Department Note  [facility]    Chief Complaint   Patient presents with   . Abdominal Pain       HISTORY OF PRESENT ILLNESS  This is a 67 year old female who was advised to come to the ER by her OB/GYN, Dr. Dorna Bloom for evaluation of abdominal pain.  Patient said that she has had significant generalized lower abdominal pain for the past several days.  She endorses nausea but no vomiting.  She notes that she is on Macrobid currently for UTI however has no urinary symptoms at this point.  She denies fevers or chills.  Reports her last bowel movement was this morning and was normal.  Denies blood in her stool.  Denies flank pain.       History provided by:  Patient  Language interpreter used: No      PAST MEDICAL HISTORY  Patient Active Problem List   Diagnosis   . Osteoporosis   . Osteoarthritis   . Benign neoplasm of colon, unspecified   . Elevated blood pressure reading without diagnosis of hypertension   . Vision disorder   . Functional dyspepsia   . Vitamin D deficiency   . Chronic tension headache   . Allergic rhinitis   . RUQ pain     Past Medical History:   Diagnosis Date   . Chronic tension headaches    . Dyspepsia    . Hx of adenomatous polyp of colon    . Mood disorder (CMS-HCC)    . Osteoarthritis    . Osteoporosis    . Vitamin D deficiency      SURGICAL/FAM/SOCIAL HISTORY  Past Surgical History:   Procedure Laterality Date   . COLONOSCOPY         No family history on file.    Social History     Tobacco Use   . Smoking status: Never     Passive exposure: Never   . Smokeless tobacco: Never   Vaping Use   . Vaping Use: Never used   Substance Use Topics   . Alcohol use: Never   . Drug use: Never       MEDICATIONS  Prior to Admission medications    Medication Sig Start Date End Date Taking? Authorizing Provider   alendronate (Fosamax) 70 mg tablet TAKE 1 TABLET BY MOUTH ONE TIME PER WEEK 03/16/22   Rosina Lowenstein, MD   cal-D3-mag11-zinc-cop-mang-bor 600 mg calcium- 800 unit-50 mg tablet Take 1  tablet by mouth in the morning. 09/21/20   Margarette Alla German, NP   cetirizine (ZyrTEC) 10 mg tablet Take 1 tablet (10 mg) by mouth once daily. 01/03/22   Pat Kocher, MD   lutein 6 mg capsule Take 6 mg by mouth.    Historical Provider, MD   multivitamin (Multi-Delyn) liquid oral solution Take 5 mL by mouth in the morning.    Historical Provider, MD   nitrofurantoin, macrocrystal-monohydrate, (Macrobid) 100 mg capsule Take 1 capsule (100 mg) by mouth twice daily for 10 days. 05/16/22 05/26/22  Richarda Osmond. Dorna Bloom, MD   omeprazole (PriLOSEC) 20 mg DR capsule Take 1 capsule (20 mg) by mouth if needed each day (dyspepsia). 01/03/22   Pat Kocher, MD      Allergies   Allergen Reactions   . Influenza Vaccine Tr-S 11 (Pf) Swelling   . Pollen Extracts    . Sulfur    . Amitriptyline Palpitations   . Boostrix Tdap [Diphth,Pertus(Acell),Tetanus] Rash   . Flu Virus Vaccine Tv  2015-16 (18 Yr And Up),Recomb Rash   . Penicillins Hives and Rash   . Sulfa (Sulfonamide Antibiotics) Rash     REVIEW OF SYSTEMS  Review of Systems   Constitutional: Negative for chills and fever.   HENT: Negative for ear pain and sore throat.    Eyes: Negative for pain and visual disturbance.   Respiratory: Negative for cough and shortness of breath.    Cardiovascular: Negative for chest pain and palpitations.   Gastrointestinal: Positive for abdominal pain. Negative for diarrhea, nausea and vomiting.   Genitourinary: Negative for dysuria and hematuria.   Musculoskeletal: Negative for arthralgias and back pain.   Skin: Negative for color change and rash.   Neurological: Negative for seizures and syncope.   All other systems reviewed and are negative.    PHYSICAL EXAM  ED Triage Vitals   Temp Pulse Resp BP   05/24/22 1609 05/24/22 1609 05/24/22 1609 05/24/22 1609   37 C (98.6 F) 73 18 135/62      SpO2 Temp src Heart Rate Source Patient Position   05/24/22 1609 -- -- 05/24/22 1725   100 %   Lying      BP Location FiO2 (%)     05/24/22 1725 --      Left arm        Physical Exam  Vitals and nursing note reviewed.   Constitutional:       General: She is not in acute distress.     Appearance: Normal appearance. She is not toxic-appearing.      Comments: Patient is alert and oriented, in no acute distress.  Resting the stretcher, nontoxic   HENT:      Head: Normocephalic and atraumatic.      Nose: Nose normal.      Mouth/Throat:      Mouth: Mucous membranes are moist.      Pharynx: Oropharynx is clear.   Eyes:      Conjunctiva/sclera: Conjunctivae normal.   Cardiovascular:      Rate and Rhythm: Normal rate.   Pulmonary:      Effort: Pulmonary effort is normal.   Abdominal:      General: There is no distension.      Tenderness: There is abdominal tenderness in the right lower quadrant, suprapubic area and left lower quadrant.      Comments: Patient has reproducible tenderness to palpation in the lower abdomen.  No guarding or rebound tenderness.  Abdomen is soft.   Musculoskeletal:         General: No swelling.      Cervical back: Normal range of motion.   Skin:     General: Skin is warm and dry.      Capillary Refill: Capillary refill takes less than 2 seconds.      Findings: No rash.   Neurological:      General: No focal deficit present.      Mental Status: She is alert and oriented to person, place, and time.       TESTING RESULTS  PROCEDURES  Labs Reviewed   LIPASE - Normal       Result Value    Lipase 41     URINALYSIS REFLEX TO CULTURE - Normal    Color, Ur Yellow      Clarity, Ur Clear      Specific Gravity, Ur 1.010      pH, Ur 7.5      Protein,Ur Negative      Glucose,Ur Negative  Ketones, Ur Negative      Bilirubin, Ur Negative      Blood, Ur Negative      Urobilinogen, Ur 0.2      Nitrite, Ur Negative      Leukocyte Esterase, Ur Negative     CBC W/DIFF    Narrative:     The following orders were created for panel order CBC and differential.  Procedure                               Abnormality         Status                     ---------                                -----------         ------                     CBC w/ Differential[[id number]]                              Final result                 Please view results for these tests on the individual orders.   COMPREHENSIVE METABOLIC PANEL    Sodium 140      Potassium 3.8      Chloride 105      CO2 (Bicarbonate) 30      Anion Gap 5      BUN 10      Creatinine 0.66      eGFRcr 95      Glucose 106      Fasting? Unknown      Calcium 9.3      AST 22      ALT 30      Alkaline phosphatase 55      Protein, total 7.1      Albumin 3.6      Bilirubin, total 0.3     CBC WITH DIFFERENTIAL    WBC 6.4      RBC 4.62      Hemoglobin 13.3      Hematocrit 40.6      MCV 87.9      MCH 28.8      MCHC 32.8      RDW-CV 12.9      RDW-SD 41.5      Platelets 192      MPV 10.5      Neutrophil % 49.1      Lymphocyte % 41.3      Monocytes % 7.2      Eosinophils % 1.7      Basophils % 0.5      Immature Granulocytes % 0.2      NRBC % 0.0      Neutrophils Absolute 3.16      Lymphocytes Absolute 2.65      Monocytes Absolute 0.46      Eosinophils Absolute 0.11      Basophils Absolute 0.03      Immature Granulocytes Absolute 0.01      NRBC Absolute 0.00       CT ABDOMEN PELVIS W CONTRAST   Final Result   No acute or significant abnormality is seen.  [name] 05/24/2022 6:03 PM        Procedures  ED COURSE/MDM  Diagnoses as of 05/24/22 1939   Lower abdominal pain     ED Course & MDM   Medical Decision Making  67 year old female presents to the emergency department for evaluation of lower abdominal pain.  On arrival, patient is alert and oriented, no acute distress.  She is uncomfortable appearing, however nontoxic.  She is afebrile, hemodynamically stable.  Physical exam is remarkable for lower abdominal tenderness to palpation.  She has no rebound tenderness or guarding.  Abdomen is soft, nondistended.  Remainder of exam is unremarkable.    IV access established and the above workup was obtained.  Workup independently interpreted  by myself with official read of imaging as per radiology.  CBC and CMP are grossly unremarkable.  Lipase is within normal limits.  Urinalysis is unremarkable for urinary tract infection.  CT of the abdomen and pelvis was obtained and revealed no acute pathology.  I discussed results with patient.  At this time, patient is reporting that she is in constant pain.  She reports taking Advil twice yesterday without significant relief of her symptoms.  Reports that she is unable to tolerate getting out of bed for prolonged periods of time and standing up makes it worse.  She reports that she had to call out of work yesterday and today due to the pain.  Patient was ordered for a dose of morphine 4 mg IV due to discomfort.  Prior to giving the medication, patient reports that she did not want to take anything that would cause her to be sedated, so she was instead ordered for Toradol 15 mg IV.    Chart review reveals that patient had a pelvic ultrasound yesterday which revealed trace fluid in the endometrial canal. Otherwise normal uterus.  Bilateral ovaries not visualized.  Bladder is incompletely filled, with reported cystocele not assessed on the basis of this study. Dedicated ultrasound of the bladder or MRI of the pelvis is recommended per this report.  On reexamination, patient's pain is improved.  She would like to go home at this time.  Discussed with patient that she should follow-up with her outpatient team.  If she develops any severe worsening symptoms or concerns, she should return to care sooner for reevaluation.  Patient in agreement with this plan.  All questions answered.    Risk  Prescription drug management.      Differential diagnosis: Urinary tract infection, nephrolithiasis, appendicitis, ovarian etiology, constipation     Tests and medications considered but not ordered/prescribed: Considered antibiotics but not indicated as patient has no evidence of bacterial infection.     Consideration of higher  level of care: The patient does not require a higher level of care at this time and is appropriate for outpatient management of their symptoms    DIAGNOSIS  Problem List Items Addressed This Visit    None  Visit Diagnoses     Lower abdominal pain    -  Primary        CONDITION  Fair    DISPOSITION  Discharge    Patient informed of evaluation results. Hospitalization is not necessary as patient is safe for discharge and appropriate for outpatient management. I have answered all questions.  Patient told to inform primary care doctor today of this ED visit and to obtain follow-up visit or return if unable to see primary care doctor.  Patient told to seek medical attention immediately for  any further concerns, worsening of condition, or not getting better in the expected time thought to. Patient has been discharged.    This dictation was created through voice recognition. The dictation is proof read, however, grammatical errors and voice recognition errors could still be present. Please contact me about any errors.     Anselmo Pickler, [place]  05/24/22 2011

## 2022-05-24 NOTE — ED Progress Note (Signed)
MEDICAL SCREENING EXAM - PROVIDER IN TRIAGE    Brief HPI: Patient is a 67 year old female who was advised to come here from her OB/GYN, Dr. For evaluation of abdominal pain.  Patient said that she has had significant generalized abdominal pain for the past 3 days.  She endorses nausea but no vomiting.  She notes that she is on Macrobid currently for UTI however has no urinary symptoms at this point.  She denies fevers or chills.  Denies blood in her stool.  Denies flank pain    Exam:  Constitutional: Vital signs reviewed. Well-nourished.  Respiratory: Nonlabored. Speaking in full clear sentences.   Skin: Normal color. No rash.  Neuro: Alert. Normal gross motor.  Psych. Normal affect    Tests ordered:   Orders Placed This Encounter   Procedures   . CT ABDOMEN PELVIS W CONTRAST   . CBC and differential   . Comprehensive metabolic panel   . Lipase   . Urinalysis reflex to culture   . CBC w/ Differential   . Insert peripheral IV   . Saline lock IV       Disposition: Main ED    Observation Status: Patient placed in ED Abdominal Pain Observation for symptom management and CT scan to rule out acute intra-abdominal process       Patient was advised not to leave the emergency department until further evaluation and work-up is complete.     Tessie Eke, [place]  05/24/22 [phone]

## 2022-05-24 NOTE — Telephone Encounter (Signed)
Patient called in stating they had an ultrasound yesterday and patient would like to have the lab result and the next step because patient is still in a lot of pain. Please follow up    Call back #[phone]

## 2022-05-24 NOTE — ED Notes (Signed)
Pt and family member verbalize understanding of discharge instructions and follow up care. IV removed. Pt ambulated out of ED with family without incident.      Kathrin Ruddy, RN  05/24/22 2121

## 2022-05-24 NOTE — ED Triage Notes (Signed)
Pt reports lower abd pain over the last x1 week.  Pt states she was here yesterday for an [place] and was told today by Dr. Dorna Bloom to come in for a CT.  Pt reports she is nauseous nut has not vomited.

## 2022-05-24 NOTE — ED Notes (Signed)
Assumed care of pt.      Kathrin Ruddy, RN  05/24/22 2010

## 2022-05-24 NOTE — Discharge Instructions (Addendum)
You were seen for lower abdominal pain.  All of your lab work was normal and reassuring.  CT scan of your abdomen and pelvis did not reveal any significant abnormality.  You were given a dose of Toradol IV for pain with some improvement.  At this time, we have ruled out several emergent causes of your symptoms.  Recommending continued supportive measures including plenty of fluids, Tylenol and Motrin.  Recommend following up with your PCP and your OB/GYN.  If you have any worsening symptoms, return to care sooner for reevaluation.

## 2022-05-24 NOTE — Telephone Encounter (Signed)
[place] ordered by GYN office.  It revealed trace fluid and could not visualize the cystocele well. Please advise patient to check with GYN office for next best steps regarding further imaging.          === 05/23/22 ===    US PELVIC NON OB WITH TRANSVAGINAL    - Impression -  1. Trace fluid in the endometrial canal. Otherwise normal uterus.  2. Bilateral ovaries not visualized.  3. Bladder is incompletely filled, with reported cystocele not assessed on the basis of this study. Dedicated ultrasound of the bladder or MRI of the pelvis is recommended.    Anne Fu, MD 05/23/2022 10:33 AM

## 2022-05-24 NOTE — ED Notes (Signed)
Pt reports 7/10 low mid abd pain x 1 week, treated for UTI by PCP and sent to GYN doctor per pt without improvement. Denies N/V/D or chills or fever.      Ellouise Newer, RN  05/24/22 1725

## 2022-05-25 NOTE — Telephone Encounter (Signed)
Dr Dorna Bloom Daughter took Mom yesterday and was told it's a prolapse uterus . Pt in alot of pain today and was wondering next step.

## 2022-05-25 NOTE — Telephone Encounter (Signed)
It was nice to speak to you  Keep next weeks appointment for an endometrial biopsy and fitting pessary

## 2022-05-30 ENCOUNTER — Ambulatory Visit
Admit: 2022-05-30 | Discharge: 2022-05-30 | Payer: MEDICARE | Attending: Obstetrics & Gynecology | Primary: Family Medicine

## 2022-05-30 DIAGNOSIS — R1084 Generalized abdominal pain: Secondary | ICD-10-CM

## 2022-05-30 NOTE — Progress Notes (Signed)
NAME:[name]   DOB:Feb 04, 1955   ZOX:[id number]    DATE OF VISIT: 05/30/2022    Chaperone offered - declined  Primary care doctor:    Problem: visit  1. CHIEF COMPLAINT: results of u/s   Narrative & Impression   INDICATION: Moderate cystocele    TECHNIQUE: Ultrasound of the pelvis was performed with transabdominal and transvaginal approach.     COMPARISON: None.    FINDINGS:     UTERUS: The uterus is anteverted and measures 4.0 x 2.7 x 4.3 cm. There are no myometrial masses.    ENDOMETRIUM: The endometrium measures 4 mm after excluding trace canal fluid. No focal lesions.    OVARIES: The ovaries are not visualized.     FLUID: There is no free fluid.    OTHER: The bladder is incompletely filled, with reported cystocele not assessed on the basis of this study.    IMPRESSION:  1. Trace fluid in the endometrial canal. Otherwise normal uterus.  2. Bilateral ovaries not visualized.  3. Bladder is incompletely filled, with reported cystocele not assessed on the basis of this study. Dedicated ultrasound of the bladder or MRI of the pelvis is recommended.    Judith Fu, MD 05/23/2022 10:33 AM     2.   3. FYI: B/P 144/80, MANUALLY 140/76  4. She took her BP and home daily and it is ok  5. Discussed pessary   Last colonoscopy in 2023 -   Recommend an endometrial biopsy rule out cancer  -   Also advise to make an appointment with an urologist  A note  - go back to work       # 2 gellhorn short stem fitted    Needs a note to go back on Tuesday and no lifting any thing > 10 lbs    This is a follow up from last visit on          05/24/2022                 Other c/o: none  LMP:No LMP recorded (lmp unknown). Patient is postmenopausal.          Last PE with pcp:     last urine with pcp:      Review of Systems   Constitutional: Negative.    HENT: Negative.    Eyes: Negative.    Respiratory: Negative.    Cardiovascular: Negative.    Gastrointestinal: Negative.    Endocrine: Negative.    Genitourinary: Negative.     Musculoskeletal: Negative.    Skin: Negative.    Allergic/Immunologic: Negative.    Neurological: Negative.         OBJECTIVE VS  BP (!) 140/76   Ht 1.473 m   Wt 56.7 kg   LMP  (LMP Unknown)   BMI 26.13 kg/m      Patient Active Problem List   Diagnosis   . Osteoporosis   . Osteoarthritis   . Benign neoplasm of colon, unspecified   . Elevated blood pressure reading without diagnosis of hypertension   . Vision disorder   . Functional dyspepsia   . Vitamin D deficiency   . Chronic tension headache   . Allergic rhinitis   . RUQ pain          Current Outpatient Medications   Medication Sig Dispense Refill   . alendronate (Fosamax) 70 mg tablet TAKE 1 TABLET BY MOUTH ONE TIME PER WEEK 12 tablet 3   . cal-D3-mag11-zinc-cop-mang-bor 600 mg calcium- 800  unit-50 mg tablet Take 1 tablet by mouth in the morning. 90 each 0   . cetirizine (ZyrTEC) 10 mg tablet Take 1 tablet (10 mg) by mouth once daily. 90 tablet 2   . lutein 6 mg capsule Take 6 mg by mouth.     . multivitamin (Multi-Delyn) liquid oral solution Take 5 mL by mouth in the morning.     Marland Kitchen omeprazole (PriLOSEC) 20 mg DR capsule Take 1 capsule (20 mg) by mouth if needed each day (dyspepsia). 90 capsule 1     No current facility-administered medications for this visit.        Physical Exam    Assessment/Plan      Generalized abdominal pain was seen in ER last week and work up negative  Her pain resolved once she was fitted with pessary  Female cystocele     She feels better with pessary in and doing wok   She has an appointment with an urologist tomorrow         Instruction:  Advice to return to office tomorrow

## 2022-05-31 ENCOUNTER — Ambulatory Visit
Admit: 2022-05-31 | Discharge: 2022-05-31 | Payer: MEDICARE | Attending: Obstetrics & Gynecology | Primary: Family Medicine

## 2022-05-31 DIAGNOSIS — N8110 Cystocele, unspecified: Secondary | ICD-10-CM

## 2022-05-31 NOTE — Progress Notes (Signed)
NAME:[name]   DOB:[birth year]   BJY:[id number]    DATE OF VISIT: 05/31/2022    Chaperone offered - declined  Primary care doctor:    Problem: visit  1. CHIEF COMPLAINT: she feels so good when pleaser in ut  2. pessary came out this morning and she has a lot of pain.  3. Now take her off work and need surgery for removal of uterus, tubes and ovary and anterior repaired and will refer to dr. Dr. Harless Nakayama [phone]  [address], [place], [place] [address] Ph  4. Since it fails pessary      This is a follow up from last visit on            05/23/2022              Other c/o: none  LMP:No LMP recorded (lmp unknown). Patient is postmenopausal.          Last PE with pcp:     last urine with pcp:      Review of Systems   Constitutional: Negative.    HENT: Negative.    Eyes: Negative.    Respiratory: Negative.    Cardiovascular: Negative.    Gastrointestinal: Negative.    Endocrine: Negative.    Genitourinary: Negative.    Musculoskeletal: Negative.    Skin: Negative.    Allergic/Immunologic: Negative.    Neurological: Negative.         OBJECTIVE VS  BP (!) 146/75   Wt 56.7 kg   LMP  (LMP Unknown)   BMI 26.13 kg/m      Patient Active Problem List   Diagnosis   . Osteoporosis   . Osteoarthritis   . Benign neoplasm of colon, unspecified   . Elevated blood pressure reading without diagnosis of hypertension   . Vision disorder   . Functional dyspepsia   . Vitamin D deficiency   . Chronic tension headache   . Allergic rhinitis   . RUQ pain          Current Outpatient Medications   Medication Sig Dispense Refill   . alendronate (Fosamax) 70 mg tablet TAKE 1 TABLET BY MOUTH ONE TIME PER WEEK 12 tablet 3   . cal-D3-mag11-zinc-cop-mang-bor 600 mg calcium- 800 unit-50 mg tablet Take 1 tablet by mouth in the morning. 90 each 0   . cetirizine (ZyrTEC) 10 mg tablet Take 1 tablet (10 mg) by mouth once daily. 90 tablet 2   . lutein 6 mg capsule Take 6 mg by mouth.     . multivitamin (Multi-Delyn) liquid oral solution Take 5  mL by mouth in the morning.     Marland Kitchen omeprazole (PriLOSEC) 20 mg DR capsule Take 1 capsule (20 mg) by mouth if needed each day (dyspepsia). 90 capsule 1     No current facility-administered medications for this visit.        Physical Exam    Moderate cystocele       Assessment/Plan      Female cystocele - moderate  Rectocele - mild  Lots pelvic pain due to the cystocele     She needs a surgery and make an appointment with Dr. Rich Reining               Mild/mod cystocele and mild rectocele  She is on colace daily     Instruction:    She needs an appointment with Dr. Dr. Harless Nakayama in [place] 10 am next Wednesday  Address: [address], [place], [place] [address]  Phone: [phone]    She has an appointment with an urologist

## 2022-06-07 ENCOUNTER — Ambulatory Visit
Admit: 2022-06-07 | Discharge: 2022-06-07 | Payer: MEDICARE | Attending: Obstetrics & Gynecology | Primary: Family Medicine

## 2022-06-07 DIAGNOSIS — N814 Uterovaginal prolapse, unspecified: Secondary | ICD-10-CM

## 2022-06-07 NOTE — Progress Notes (Signed)
[facility]  [address] [place] [address]  [phone]    Patient ID: [name] is a 67 y.o. female who presents for Consult (Dr. Dorna Bloom referred for surgery).    Subjective   HPI: New pt referred by Dr Dorna Bloom for pelic organ prolapse.  Pt developed pelvic pain and was noted to have prolapse.  Pt tried pessary but could not tolerate it.  No incontinence.  G4P4, SVD x 4.  No other medical problems.  No bleeding.  Pt wants to discuss surgical treatment    Past Medical History:   Diagnosis Date   . Chronic tension headaches    . Dyspepsia    . Hx of adenomatous polyp of colon    . Mood disorder (CMS-HCC)    . Osteoarthritis    . Osteoporosis    . Vitamin D deficiency         Past Surgical History:   Procedure Laterality Date   . COLONOSCOPY          Allergies   Allergen Reactions   . Influenza Vaccine Tr-S 11 (Pf) Swelling   . Pollen Extracts    . Sulfur    . Amitriptyline Palpitations   . Boostrix Tdap [Diphth,Pertus(Acell),Tetanus] Rash   . Flu Virus Vaccine Tv 2015-16 (18 Yr And Up),Recomb Rash   . Penicillins Hives and Rash   . Sulfa (Sulfonamide Antibiotics) Rash          Current Outpatient Medications:   .  alendronate (Fosamax) 70 mg tablet, TAKE 1 TABLET BY MOUTH ONE TIME PER WEEK, Disp: 12 tablet, Rfl: 3  .  cal-D3-mag11-zinc-cop-mang-bor 600 mg calcium- 800 unit-50 mg tablet, Take 1 tablet by mouth in the morning., Disp: 90 each, Rfl: 0  .  cetirizine (ZyrTEC) 10 mg tablet, Take 1 tablet (10 mg) by mouth once daily., Disp: 90 tablet, Rfl: 2  .  lutein 6 mg capsule, Take 6 mg by mouth., Disp: , Rfl:   .  multivitamin (Multi-Delyn) liquid oral solution, Take 5 mL by mouth in the morning., Disp: , Rfl:   .  omeprazole (PriLOSEC) 20 mg DR capsule, Take 1 capsule (20 mg) by mouth if needed each day (dyspepsia)., Disp: 90 capsule, Rfl: 1     Social History     Socioeconomic History   . Marital status: Married     Spouse name: Not on file   . Number of  children: Not on file   . Years of education: Not on file   . Highest education level: Not on file   Occupational History   . Not on file   Tobacco Use   . Smoking status: Never     Passive exposure: Never   . Smokeless tobacco: Never   Vaping Use   . Vaping Use: Never used   Substance and Sexual Activity   . Alcohol use: Never   . Drug use: Never   . Sexual activity: Defer   Other Topics Concern   . Not on file   Social History Narrative   . Not on file     Social Determinants of Health     Financial Resource Strain: Not on file   Food Insecurity: Not on file   Transportation Needs: Not on file   Physical Activity: Not on file   Stress: Not on file   Social Connections: Not on file   Intimate Partner Violence: Not on file   Housing Stability: Not  on file       No family history on file.     {Objective   Visit Vitals  BP (!) 145/76 (BP Location: Right arm, Patient Position: Sitting, BP Cuff Size: Adult)   Wt 56.2 kg   LMP  (LMP Unknown)   BMI 25.92 kg/m   OB Status Postmenopausal   BSA 1.52 m       Review of Systems   Constitutional: Negative.    HENT: Negative.    Eyes: Negative.    Respiratory: Negative.    Cardiovascular: Negative.    Gastrointestinal: Negative.    Endocrine: Negative.    Genitourinary: Positive for pelvic pain and vaginal pain.   Musculoskeletal: Negative.    Skin: Negative.    Allergic/Immunologic: Negative.    Neurological: Negative.    Hematological: Negative.    Psychiatric/Behavioral: Negative.    All other systems reviewed and are negative.       Physical Exam  Vitals and nursing note reviewed. Exam conducted with a chaperone present.   Constitutional:       Appearance: Normal appearance.   HENT:      Head: Normocephalic and atraumatic.      Right Ear: External ear normal.      Left Ear: External ear normal.      Nose: Nose normal.      Mouth/Throat:      Mouth: Mucous membranes are moist.   Eyes:      Extraocular Movements: Extraocular movements intact.      Conjunctiva/sclera:  Conjunctivae normal.   Abdominal:      General: Abdomen is flat.      Palpations: Abdomen is soft.   Genitourinary:     General: Normal vulva.      Exam position: Lithotomy position.      Vagina: Normal.      Cervix: Normal.      Uterus: With uterine prolapse.       Adnexa: Right adnexa normal and left adnexa normal.      Comments: Cystocele  rectocele  Skin:     General: Skin is warm and dry.   Neurological:      General: No focal deficit present.      Mental Status: She is alert and oriented to person, place, and time.   Psychiatric:         Mood and Affect: Mood normal.         Behavior: Behavior normal.         Thought Content: Thought content normal.         Judgment: Judgment normal.         Assessment/Plan    Uterine prolapse  Cystocele  Rectocele    Vaginal  Hysterectomy and  A-P repair was reviewed in detail  All possible risks were discussed  All questions were answered  The pt will consider options  approx 45 mins was spent with the pt and coordinating care

## 2022-06-07 NOTE — Telephone Encounter (Addendum)
-----   Message from Harrell Gave, MD sent at 06/07/2022 11:25 AM EDT -----  Needs vag hyst, A-P repair  Dx=prolapse  Dr Haskell Riling pt - see if she can assist

## 2022-06-29 NOTE — Telephone Encounter (Signed)
Last OV: 05/02/2022  Type:  Last PE:  Does patient have 6th month follow up:  (If not, do outreach)  Last Diabetic OV:   Does patient has 3 to 6 month diabetic follow up:  (If not, do outreach)  Last BP OV:   Does patient have 6 month follow up:   (If not, do outreach)  Last TOX screen:   Does patient have 3-6 month follow up:   (If not, do outreach)  Next OV: 10/24/22

## 2022-06-30 NOTE — Telephone Encounter (Signed)
-----   Message from [place]ogg Monte sent at 06/29/2022  4:16 PM EDT -----  Regarding: FW: Prolapsed bladder  Contact: [phone]    ----- Message -----  From: [name]  Sent: 06/29/2022   3:57 PM EDT  To: [facility] [address] Mychart Messages  Subject: Prolapsed bladder                                My stomach is getting bigger I have surgery schedule in July   Should I be worried about this

## 2022-06-30 NOTE — Telephone Encounter (Signed)
Spoke with daughter, patient complains of either abdominal bloating or distention-says abdomen looks bigger.  No abdominal pain, normal bowel movements, urinating normally.    Is scheduled to get surgery for prolapsed bladder with gyne Dr. Rich Reining.      Told daughter could make an appt. Here with PCP or see what gyne advises.  She will see what gyne advises and call us back if she needs an appt.

## 2022-07-17 ENCOUNTER — Ambulatory Visit: Payer: MEDICARE | Primary: Family Medicine

## 2022-07-17 NOTE — Procedures (Signed)
SURGERY DATE:  07/22  ARRIVAL TIME: 0915    COME IN THE MAIN ENTRANCE AND TAKE THE ELEVATOR TO THE 3RD FLOOR.  Go halfway down the long hallway until you see a sign on the ceiling that says "SURGICAL REGISTRATION/  PRE-ADMISSION TESTING".   Step into the doorway of the office and let someone know you are here to check in.  Ring the bell if there is no one in the area.  THIS IS ALSO THE SAME LOCATION FOR HAVING YOUR PRE OP BLOOD WORK DRAWN . YOU CAN COME BETWEEN 8AM-4PM MONDAY-FRIDAY WITHOUT AN APPOINTMENT.    NOTHING TO EAT AFTER MIDNIGHT  Just water after midnight until 7am on day of surgery  Take omeprazole with some water    WEAR LOOSE COMFORTABLE CLOTHING    YOU MUST HAVE A RIDE HOME .  THE PERSON MUST BE AT LEAST AGE 70 AND A FAMILY MEMBER OR FRIEND.  YOU CANNOT TAKE A BUS/TAXI OR UBER HOME UNLESS ANOTHER PERSON IS WITH YOU . YOU SHOULD HAVE SOMEONE WITH YOU WHEN YOU GET HOME THE DAY OF SURGERY.    YOU MAY BRING YOUR CELLPHONE.  PLEASE MAKE SURE YOU HAVE KEYS FOR YOUR DOOR.  LEAVE ALL MONEY/ VALUABLES  AT HOME    REMOVE ALL JEWELRY AND PIERCINGS.    CALL YOU SURGEON IF YOU HAVE ANY QUESTIONS OR CHANGES IN [employer].

## 2022-07-20 ENCOUNTER — Institutional Professional Consult (permissible substitution)
Admit: 2022-07-20 | Discharge: 2022-07-20 | Payer: MEDICARE | Attending: Obstetrics & Gynecology | Primary: Family Medicine

## 2022-07-20 DIAGNOSIS — R5383 Other fatigue: Secondary | ICD-10-CM

## 2022-07-20 NOTE — Progress Notes (Signed)
[facility] [place]  [address] [place] [address]  [phone]    Patient ID: [name] is a 67 y.o. female who presents for follow up.    Subjective   HPI: The pt is here for follow up.  The pt c/o vaginal pressure with pelvic pain and noticed a protrusion.  Uterine prolapse, cystocele and rectocele was noted on exam.  The pt tried a pessary but could not tolerate it.  The pt has elected to schedule vaginal hysterectomy, anterior and posterior colporrhaphy.  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    Retina disorder     bilateral,    gets injections monthly    Vitamin D deficiency         Past Surgical History:   Procedure Laterality Date    COLONOSCOPY          Allergies   Allergen Reactions    Influenza Vaccine Tr-S 11 (Pf) Swelling    Penicillins Hives and Rash    Amitriptyline Palpitations    Boostrix Tdap [Diphth,Pertus(Acell),Tetanus] Rash    Flu Virus Vaccine Tv 2015-16 (18 Yr And Up),Recomb Rash    Sulfa (Sulfonamide Antibiotics) Rash          Current Outpatient Medications:     alendronate (Fosamax) 70 mg tablet, TAKE 1 TABLET BY MOUTH ONE TIME PER WEEK (Patient taking differently: Take 70 mg by mouth. TAKE 1 TABLET BY MOUTH ONE TIME PER WEEK on Mondays), Disp: 12 tablet, Rfl: 3    aspirin 81 mg EC tablet, Take 81 mg by mouth once daily., Disp: , Rfl:     cal-D3-mag11-zinc-cop-mang-bor 600 mg calcium- 800 unit-50 mg tablet, Take 1 tablet by mouth in the morning. (Patient not taking: Reported on 07/17/2022), Disp: 90 each, Rfl: 0    calcium citrate-vitamin D3 (Citracal + D Maximum) 315 mg-6.25 mcg (250 unit) tablet, Take 1 tablet by mouth once daily., Disp:  , Rfl:     cetirizine (ZyrTEC) 10 mg tablet, Take 1 tablet (10 mg) by mouth once daily. (Patient taking differently: Take 10 mg by mouth if needed each day.), Disp: 90 tablet, Rfl: 2    lutein 6 mg capsule, Take 6 mg by mouth., Disp: , Rfl:     multivitamin (Multi-Delyn) liquid oral solution, Take 5 mL by mouth in the morning. (Patient not taking: Reported on 07/17/2022), Disp: , Rfl:     multivitamin with minerals tablet, Take 1 tablet by mouth once daily., Disp: , Rfl:     omeprazole (PriLOSEC) 20 mg DR capsule, TAKE 1 CAPSULE (20 MG) BY MOUTH IF NEEDED EACH DAY (DYSPEPSIA)., Disp: 90 capsule, Rfl: 1     Social History     Socioeconomic History    Marital status: Married     Spouse name: Not on file    Number of children: Not on file    Years of education: Not on file    Highest education level: Not on file   Occupational History    Not on file   Tobacco Use  Smoking status: Never     Passive exposure: Never    Smokeless tobacco: Never   Vaping Use    Vaping Use: Never used   Substance and Sexual Activity    Alcohol use: Not Currently     Comment: rare    Drug use: Never    Sexual activity: Defer   Other Topics Concern    Not on file   Social History Narrative    Not on file     Social Determinants of Health     Financial Resource Strain: Not on file   Food Insecurity: Not on file   Transportation Needs: Not on file   Physical Activity: Not on file   Stress: Not on file   Social Connections: Not on file   Intimate Partner Violence: Not on file   Housing Stability: Not on file       No family history on file.     {Objective   Visit Vitals  BP 118/74   Wt 59 kg   LMP  (LMP Unknown)   BMI 26.27 kg/m   OB Status Postmenopausal   BSA 1.57 m       ZOX:WRUEAV pain  Vaginal pain  Vaginal bulge     Physical Exam  Vitals and nursing note reviewed.   HENT:      Head: Normocephalic and atraumatic.      Right Ear: External ear normal.      Left Ear: External ear normal.      Nose: Nose normal.      Mouth/Throat:      Mouth:  Mucous membranes are moist.   Eyes:      Extraocular Movements: Extraocular movements intact.      Conjunctiva/sclera: Conjunctivae normal.   Pulmonary:      Effort: Pulmonary effort is normal.   Abdominal:      General: Abdomen is flat.   Skin:     General: Skin is warm and dry.   Neurological:      General: No focal deficit present.      Mental Status: She is alert and oriented to person, place, and time.   Psychiatric:         Mood and Affect: Mood normal.         Behavior: Behavior normal.         Thought Content: Thought content normal.         Judgment: Judgment normal.         Assessment/Plan   [name] was seen today for pre-op visit.  Other fatigue  -     CBC; Future  Uterine prolapse  -     Type and screen; Future  -     CBC; Future  Female cystocele  Pelvic pain    Vaginal hysterectomy with A-P colporrhaphy was reviewed in detail  All possible risks were discussed  All questions were answered  The pt understands and agrees  Surgical consents were signed  Approx 45 mins was spent with the pt and coordinating  care

## 2022-07-25 ENCOUNTER — Ambulatory Visit: Admit: 2022-07-25 | Discharge: 2022-07-25 | Payer: MEDICARE | Primary: Family Medicine

## 2022-07-25 DIAGNOSIS — Z01818 Encounter for other preprocedural examination: Secondary | ICD-10-CM

## 2022-07-25 LAB — CBC
Hematocrit: 40.3 % (ref 32.0–47.0)
Hemoglobin: 13.6 g/dL (ref 11.0–16.0)
MCH: 30.1 pg (ref 26.0–34.0)
MCHC: 33.7 g/dL (ref 31.0–37.0)
MCV: 89.2 fL (ref 80.0–100.0)
MPV: 11.1 fL (ref 9.1–12.4)
NRBC %: 0 % (ref 0.0–0.0)
NRBC Absolute: 0 10*3/uL (ref 0.00–2.00)
Platelets: 193 10*3/uL (ref 150–400)
RBC: 4.52 M/uL (ref 3.70–5.20)
RDW-CV: 13.3 % (ref 11.5–14.5)
RDW-SD: 43.9 fL (ref 35.0–51.0)
WBC: 6 10*3/uL (ref 4.0–11.0)

## 2022-07-25 LAB — TYPE AND SCREEN
ABORh: A POS
Antibody Screen: NEGATIVE

## 2022-07-25 LAB — PRE ADMISSION TESTING

## 2022-07-28 ENCOUNTER — Ambulatory Visit: Payer: MEDICARE | Attending: Specialist | Primary: Family Medicine

## 2022-07-30 NOTE — H&P (Signed)
ADMISSION H&P      Today's Date: 07/30/2022  MRN: [id number]  Name: [name]  DOB: Jan 05, 1956    Chief Complaint  Pelvic organ prolapse    History Of Present Illness  Roxanne Panek is a 67 y.o. female presenting with uterine prolapse, cystocele and rectocele.  The pt has eperienced wirsening pelvic pain, vaginal pressure and a vaginal bulge.  She could not tolerate a pessary.  The pt is admitted for vaginal hysterectomy and anterior + posterior colporrhaphy.       Past Medical History  She has a past medical history of Chronic tension headaches, Colon polyp, Dyspepsia, GERD (gastroesophageal reflux disease), adenomatous polyp of colon, Mood disorder (CMS-HCC), Osteoarthritis, Osteoporosis, Prolapse of female bladder, acquired (06/2022), Retina disorder, and Vitamin D deficiency.    Surgical History  She has a past surgical history that includes Colonoscopy.     Social History  She reports that she has never smoked. She has never been exposed to tobacco smoke. She has never used smokeless tobacco. She reports that she does not currently use alcohol. She reports that she does not use drugs.    Family History  No family history on file.     Advance Care Plan  Extended Emergency Contact Information  Primary Emergency Contact: Merlene Morse [place]  Mobile Phone: [phone]  Relation: Spouse  Preferred language: English  Interpreter needed? No  Secondary Emergency Contact: BROGNA,SONYA  Mobile Phone: [phone]  Relation: Daughter  No Order         Allergies  Influenza vaccine tr-s 11 (pf); Penicillins; Amitriptyline; Boostrix tdap [diphth,pertus(acell),tetanus]; Flu virus vaccine tv 2015-16 (18 yr and up),recomb; and Sulfa (sulfonamide antibiotics)     Medications  No medications prior to admission.      Medications       None            Review of Systems   Constitutional: Negative.    HENT: Negative.     Eyes: Negative.    Respiratory: Negative.     Cardiovascular: Negative.    Gastrointestinal:  Negative.    Endocrine: Negative.    Genitourinary:  Positive for pelvic pain and vaginal pain.   Musculoskeletal: Negative.    Skin: Negative.    Allergic/Immunologic: Negative.    Neurological: Negative.    Hematological: Negative.    Psychiatric/Behavioral: Negative.     All other systems reviewed and are negative.      Objective   Physical Exam  Vitals and nursing note reviewed. Exam conducted with a chaperone present.   Constitutional:       Appearance: Normal appearance.   HENT:      Head: Normocephalic and atraumatic.      Right Ear: External ear normal.      Left Ear: External ear normal.      Nose: Nose normal.      Mouth/Throat:      Mouth: Mucous membranes are dry.      Pharynx: Oropharynx is clear.   Eyes:      Extraocular Movements: Extraocular movements intact.      Conjunctiva/sclera: Conjunctivae normal.   Cardiovascular:      Rate and Rhythm: Normal rate and regular rhythm.      Heart sounds: Normal heart sounds.   Pulmonary:      Effort: Pulmonary effort is normal.      Breath sounds: Normal breath sounds.   Abdominal:      General: Abdomen is flat.  Palpations: Abdomen is soft.   Genitourinary:     General: Normal vulva.      Exam position: Lithotomy position.      Cervix: Normal.      Uterus: Normal. With uterine prolapse.       Adnexa: Right adnexa normal and left adnexa normal.   Skin:     General: Skin is warm and dry.   Neurological:      General: No focal deficit present.      Mental Status: She is alert and oriented to person, place, and time.   Psychiatric:         Mood and Affect: Mood normal.         Behavior: Behavior normal.         Thought Content: Thought content normal.         Judgment: Judgment normal.         Last Recorded Vitals  Height 1.499 m, weight 59 kg.       Assessment/Plan   Uterine prolapse  Cystocele  Rectocele  Pelvic pain    Vaginal hysterectomy  Anterior and posterior colporrhaphy    Harrell Gave, MD

## 2022-07-31 LAB — CONFIRM ABO/RH: Confirm ABORh: A POS

## 2022-07-31 MED ORDER — phenylephrine (Neo-Synephrine) injection
10 | INTRAMUSCULAR | Status: DC | PRN
Start: 2022-07-31 — End: 2022-07-31
  Administered 2022-07-31: 15:00:00 100 via INTRAVENOUS

## 2022-07-31 MED ORDER — rocuronium (ZeMuron) injection
10 | INTRAVENOUS | Status: DC | PRN
Start: 2022-07-31 — End: 2022-07-31
  Administered 2022-07-31: 15:00:00 50 via INTRAVENOUS

## 2022-07-31 MED ORDER — lidocaine PF (Xylocaine) 20 mg/mL (2 %) injection
20 | INTRAMUSCULAR | Status: DC | PRN
Start: 2022-07-31 — End: 2022-07-31
  Administered 2022-07-31: 15:00:00 60 via INTRAVENOUS

## 2022-07-31 MED ORDER — fentaNYL (Sublimaze) injection
50 | INTRAMUSCULAR | Status: DC | PRN
Start: 2022-07-31 — End: 2022-07-31
  Administered 2022-07-31: 15:00:00 100 via INTRAVENOUS

## 2022-07-31 MED ORDER — ondansetron (Zofran) injection 4 mg
4 | Freq: Three times a day (TID) | INTRAMUSCULAR | Status: DC | PRN
Start: 2022-07-31 — End: 2022-08-01

## 2022-07-31 MED ORDER — acetaminophen (Ofirmev) 1,000 mg/100 mL (10 mg/mL) injection  - Omnicell Override Pull
1000 | INTRAVENOUS | Status: AC
Start: 2022-07-31 — End: ?

## 2022-07-31 MED ORDER — midazolam (Versed) 1 mg/mL injection  - Omnicell Override Pull
1 | INTRAMUSCULAR | Status: AC
Start: 2022-07-31 — End: ?

## 2022-07-31 MED ORDER — diphenhydrAMINE (BENADryl) injection 25 mg
50 | Freq: Four times a day (QID) | INTRAMUSCULAR | Status: DC | PRN
Start: 2022-07-31 — End: 2022-08-01

## 2022-07-31 MED ORDER — diphenhydrAMINE (BENADryl) injection
50 | INTRAMUSCULAR | Status: DC | PRN
Start: 2022-07-31 — End: 2022-07-31
  Administered 2022-07-31: 17:00:00 25 via INTRAVENOUS

## 2022-07-31 MED ORDER — sugammadex (Bridion) injection
100 | INTRAVENOUS | Status: DC | PRN
Start: 2022-07-31 — End: 2022-07-31
  Administered 2022-07-31: 17:00:00 200 via INTRAVENOUS

## 2022-07-31 MED ORDER — sodium chloride 0.9 % flush 10 mL
Freq: Three times a day (TID) | INTRAMUSCULAR | Status: DC | PRN
Start: 2022-07-31 — End: 2022-07-31

## 2022-07-31 MED ORDER — ondansetron ODT (Zofran-ODT) disintegrating tablet 4 mg
4 | Freq: Three times a day (TID) | ORAL | Status: DC | PRN
Start: 2022-07-31 — End: 2022-08-01

## 2022-07-31 MED ORDER — lactated Ringer's infusion
INTRAVENOUS | Status: DC
Start: 2022-07-31 — End: 2022-08-01
  Administered 2022-07-31 (×2): via INTRAVENOUS

## 2022-07-31 MED ORDER — haloperidol lactate (Haldol) injection 1 mg
5 | Freq: Once | INTRAMUSCULAR | Status: DC | PRN
Start: 2022-07-31 — End: 2022-07-31

## 2022-07-31 MED ORDER — lidocaine PF (Xylocaine) 20 mg/mL (2 %) injection  - Omnicell Override Pull
20 | INTRAMUSCULAR | Status: AC
Start: 2022-07-31 — End: ?

## 2022-07-31 MED ORDER — ePHEDrine injection
50 | INTRAVENOUS | Status: DC | PRN
Start: 2022-07-31 — End: 2022-07-31
  Administered 2022-07-31 (×3): 5 via INTRAVENOUS

## 2022-07-31 MED ORDER — propofol (Diprivan) injection
10 | INTRAVENOUS | Status: DC | PRN
Start: 2022-07-31 — End: 2022-07-31
  Administered 2022-07-31: 15:00:00 150 via INTRAVENOUS

## 2022-07-31 MED ORDER — BUPivacaine-EPINEPHrine (PF) (Marcaine w/EPI) 0.5 %-1:200,000 injection
0.5 | INTRAMUSCULAR | Status: DC | PRN
Start: 2022-07-31 — End: 2022-07-31
  Administered 2022-07-31: 16:00:00 11

## 2022-07-31 MED ORDER — oxyCODONE (Roxicodone) immediate release tablet 5 mg
5 | Freq: Once | ORAL | Status: DC | PRN
Start: 2022-07-31 — End: 2022-07-31

## 2022-07-31 MED ORDER — lactated Ringer's infusion
INTRAVENOUS | Status: DC
Start: 2022-07-31 — End: 2022-08-01
  Administered 2022-07-31: 21:00:00 100 mL/h via INTRAVENOUS

## 2022-07-31 MED ORDER — fentaNYL (Sublimaze) injection 25 mcg
50 | INTRAMUSCULAR | Status: DC | PRN
Start: 2022-07-31 — End: 2022-07-31

## 2022-07-31 MED ORDER — HYDROmorphone (Dilaudid) injection 0.2 mg
0.5 | INTRAMUSCULAR | Status: DC | PRN
Start: 2022-07-31 — End: 2022-07-31
  Administered 2022-07-31 (×2): 0.2 mg via INTRAVENOUS

## 2022-07-31 MED ORDER — dexAMETHasone (Decadron) 4 mg/mL injection  - Omnicell Override Pull
4 | INTRAMUSCULAR | Status: AC
Start: 2022-07-31 — End: ?

## 2022-07-31 MED ORDER — naloxone (Narcan) injection 0.1 mg
0.4 | INTRAMUSCULAR | Status: DC | PRN
Start: 2022-07-31 — End: 2022-08-01

## 2022-07-31 MED ORDER — ceFAZolin (Ancef) 2 g in sodium chloride 0.9 % 100 mL IVPB-MBP
Freq: Once | INTRAVENOUS | Status: AC
Start: 2022-07-31 — End: 2022-07-31
  Administered 2022-07-31: 15:00:00 2 g via INTRAVENOUS

## 2022-07-31 MED ORDER — propofol (Diprivan) 10 mg/mL injection  - Omnicell Override Pull
10 | INTRAVENOUS | Status: AC
Start: 2022-07-31 — End: ?

## 2022-07-31 MED ORDER — ketamine in 0.9 % sod chloride injection
50 | INTRAVENOUS | Status: DC | PRN
Start: 2022-07-31 — End: 2022-07-31
  Administered 2022-07-31: 16:00:00 20 via INTRAVENOUS

## 2022-07-31 MED ORDER — dexAMETHasone (Decadron) injection
4 | INTRAMUSCULAR | Status: DC | PRN
Start: 2022-07-31 — End: 2022-07-31
  Administered 2022-07-31: 15:00:00 8 via INTRAVENOUS

## 2022-07-31 MED ORDER — heparin (porcine) injection 5,000 Units
5000 | Freq: Once | INTRAMUSCULAR | Status: AC
Start: 2022-07-31 — End: 2022-07-31
  Administered 2022-07-31: 14:00:00 5000 [IU] via SUBCUTANEOUS

## 2022-07-31 MED ORDER — fentaNYL (Sublimaze) 50 mcg/mL injection  - Omnicell Override Pull
50 | INTRAMUSCULAR | Status: AC
Start: 2022-07-31 — End: ?

## 2022-07-31 MED ORDER — ketorolac (Toradol) injection 15 mg
30 | Freq: Once | INTRAMUSCULAR | Status: AC
Start: 2022-07-31 — End: 2022-07-31
  Administered 2022-07-31: 19:00:00 15 mg via INTRAVENOUS

## 2022-07-31 MED ORDER — acetaminophen (Tylenol) solution 650 mg
650 | Freq: Four times a day (QID) | ORAL | Status: DC | PRN
Start: 2022-07-31 — End: 2022-08-01

## 2022-07-31 MED ORDER — docusate sodium (Colace) capsule 100 mg
100 | Freq: Two times a day (BID) | ORAL | Status: DC
Start: 2022-07-31 — End: 2022-08-01
  Administered 2022-07-31 – 2022-08-01 (×2): 100 mg via ORAL

## 2022-07-31 MED ORDER — HYDROmorphone (Dilaudid) 2 mg/mL injection  - Omnicell Override Pull
2 | INTRAMUSCULAR | Status: AC
Start: 2022-07-31 — End: ?

## 2022-07-31 MED ORDER — acetaminophen (Ofirmev) injection
1000 | INTRAVENOUS | Status: DC | PRN
Start: 2022-07-31 — End: 2022-07-31
  Administered 2022-07-31: 16:00:00 1000 via INTRAVENOUS

## 2022-07-31 MED ORDER — ondansetron (Zofran) injection
4 | INTRAMUSCULAR | Status: DC | PRN
Start: 2022-07-31 — End: 2022-07-31
  Administered 2022-07-31: 17:00:00 4 via INTRAVENOUS

## 2022-07-31 MED ORDER — ketorolac (Toradol) injection 15 mg
15 | Freq: Four times a day (QID) | INTRAMUSCULAR | Status: DC | PRN
Start: 2022-07-31 — End: 2022-08-01
  Administered 2022-08-01 (×2): 15 mg via INTRAVENOUS

## 2022-07-31 MED ORDER — acetaminophen (Tylenol) tablet 650 mg
325 | Freq: Four times a day (QID) | ORAL | Status: DC | PRN
Start: 2022-07-31 — End: 2022-08-01
  Administered 2022-07-31 – 2022-08-01 (×3): 650 mg via ORAL

## 2022-07-31 MED ORDER — oxyCODONE (Roxicodone) immediate release tablet 5 mg
5 | Freq: Three times a day (TID) | ORAL | Status: DC | PRN
Start: 2022-07-31 — End: 2022-08-01
  Administered 2022-07-31 – 2022-08-01 (×3): 5 mg via ORAL

## 2022-07-31 MED ORDER — oxyCODONE (Roxicodone) solution 5 mg
5 | Freq: Three times a day (TID) | ORAL | Status: DC | PRN
Start: 2022-07-31 — End: 2022-08-01

## 2022-07-31 MED ORDER — midazolam (Versed) injection
1 | INTRAMUSCULAR | Status: DC | PRN
Start: 2022-07-31 — End: 2022-07-31
  Administered 2022-07-31: 15:00:00 2 via INTRAVENOUS

## 2022-07-31 MED ORDER — diphenhydrAMINE (BENADryl) 50 mg/mL injection  - Omnicell Override Pull
50 | INTRAMUSCULAR | Status: AC
Start: 2022-07-31 — End: ?

## 2022-07-31 MED ORDER — HYDROmorphone (Dilaudid) injection
2 | INTRAMUSCULAR | Status: DC | PRN
Start: 2022-07-31 — End: 2022-07-31
  Administered 2022-07-31: 16:00:00 .4 via INTRAVENOUS

## 2022-07-31 MED ORDER — lidocaine-epinephrine (PF) (Xylocaine W/EPI) 1 %-1:200,000 injection  - Omnicell Override Pull
1 | INTRAMUSCULAR | Status: AC
Start: 2022-07-31 — End: ?

## 2022-07-31 MED ORDER — sugammadex (Bridion) 100 mg/mL injection  - Omnicell Override Pull
100 | INTRAVENOUS | Status: AC
Start: 2022-07-31 — End: ?

## 2022-07-31 MED ORDER — oxyCODONE (Roxicodone) immediate release tablet 10 mg
5 | Freq: Once | ORAL | Status: DC | PRN
Start: 2022-07-31 — End: 2022-07-31

## 2022-07-31 MED ORDER — ePHEDrine injection 25 mg
50 | Freq: Once | INTRAVENOUS | Status: DC | PRN
Start: 2022-07-31 — End: 2022-07-31

## 2022-07-31 MED ORDER — rocuronium (ZeMuron) 10 mg/mL injection  - Omnicell Override Pull
10 | INTRAVENOUS | Status: AC
Start: 2022-07-31 — End: ?

## 2022-07-31 MED ORDER — ketamine in 0.9 % sod chloride 50 mg/5 mL (10 mg/mL) injection  - Omnicell Override Pull
50 | INTRAVENOUS | Status: AC
Start: 2022-07-31 — End: ?

## 2022-07-31 MED ORDER — ondansetron (Zofran) 4 mg/2 mL injection  - Omnicell Override Pull
4 | INTRAMUSCULAR | Status: AC
Start: 2022-07-31 — End: ?

## 2022-07-31 MED ORDER — HYDROmorphone (Dilaudid) injection 0.2 mg
0.5 | INTRAMUSCULAR | Status: DC | PRN
Start: 2022-07-31 — End: 2022-08-01

## 2022-07-31 MED ORDER — BUPivacaine-EPINEPHrine (Marcaine w/EPI) 0.5 %-1:200,000 injection  - Omnicell Override Pull
0.5 | INTRAMUSCULAR | Status: AC
Start: 2022-07-31 — End: ?

## 2022-07-31 MED ORDER — acetaminophen (Tylenol) suppository 650 mg
650 | Freq: Four times a day (QID) | RECTAL | Status: DC | PRN
Start: 2022-07-31 — End: 2022-08-01

## 2022-07-31 MED FILL — LIDOCAINE (PF) 20 MG/ML (2 %) INJECTION WRAPPER: 20 20 mg/mL (2 %) | INTRAMUSCULAR | Qty: 5

## 2022-07-31 MED FILL — FENTANYL (PF) 50 MCG/ML INJECTION SOLUTION: 50 50 mcg/mL | INTRAMUSCULAR | Qty: 2

## 2022-07-31 MED FILL — DOCUSATE SODIUM 100 MG CAPSULE: 100 100 mg | ORAL | Qty: 1

## 2022-07-31 MED FILL — SUGAMMADEX 100 MG/ML INTRAVENOUS SOLUTION: 100 100 mg/mL | INTRAVENOUS | Qty: 2

## 2022-07-31 MED FILL — HYDROMORPHONE 2 MG/ML INJECTION WRAPPER: 2 2 mg/mL | INTRAMUSCULAR | Qty: 1

## 2022-07-31 MED FILL — ONDANSETRON HCL (PF) 4 MG/2 ML INJECTION SOLUTION: 4 4 mg/2 mL | INTRAMUSCULAR | Qty: 2

## 2022-07-31 MED FILL — DEXAMETHASONE SODIUM PHOSPHATE 4 MG/ML INJECTION SOLUTION: 4 4 mg/mL | INTRAMUSCULAR | Qty: 1

## 2022-07-31 MED FILL — MIDAZOLAM 1 MG/ML INJECTION SOLUTION WRAPPER: 1 1 mg/mL | INTRAMUSCULAR | Qty: 2

## 2022-07-31 MED FILL — LIDOCAINE-EPINEPHRINE (PF) 1 %-1:200,000 INJECTION SOLUTION: 1 1 %-1:200,000 | INTRAMUSCULAR | Qty: 30

## 2022-07-31 MED FILL — ROCURONIUM 10 MG/ML INTRAVENOUS SOLUTION: 10 10 mg/mL | INTRAVENOUS | Qty: 5

## 2022-07-31 MED FILL — DIPHENHYDRAMINE 50 MG/ML INJECTION SOLUTION: 50 50 mg/mL | INTRAMUSCULAR | Qty: 1

## 2022-07-31 MED FILL — PROPOFOL 10 MG/ML INTRAVENOUS EMULSION: 10 10 mg/mL | INTRAVENOUS | Qty: 20

## 2022-07-31 MED FILL — OXYCODONE 5 MG TABLET: 5 5 mg | ORAL | Qty: 1

## 2022-07-31 MED FILL — CEFAZOLIN IVPB 2 G IN 100 ML NS (MINI-BAG PLUS): 2.0000 2.0000 g | Qty: 2000

## 2022-07-31 MED FILL — BUPIVACAINE-EPINEPHRINE 0.5 %-1:200,000 INJECTION SOLUTION: 0.5 0.5 %-1:200,000 | INTRAMUSCULAR | Qty: 50

## 2022-07-31 MED FILL — HEPARIN (PORCINE) 5,000 UNIT/ML INJECTION SOLUTION: 5000 5,000 unit/mL | INTRAMUSCULAR | Qty: 1

## 2022-07-31 MED FILL — HYDROMORPHONE 0.5 MG/0.5 ML INJECTION WRAPPER: 0.5 0.5 mg/0.5 mL | INTRAMUSCULAR | Qty: 0.5

## 2022-07-31 MED FILL — KETAMINE 50 MG/5 ML (10 MG/ML) IN 0.9 % SODIUM CHLORIDE IV SYRINGE: 50 50 mg/5 mL (10 mg/mL) | INTRAVENOUS | Qty: 5

## 2022-07-31 MED FILL — ACETAMINOPHEN 325 MG TABLET: 325 325 mg | ORAL | Qty: 2

## 2022-07-31 MED FILL — ACETAMINOPHEN 1,000 MG/100 ML (10 MG/ML) INTRAVENOUS SOLUTION: 1000 1,000 mg/100 mL (10 mg/mL) | INTRAVENOUS | Qty: 100

## 2022-07-31 MED FILL — KETOROLAC 30 MG/ML (1 ML) INJECTION SOLUTION: 30 30 mg/mL (1 mL) | INTRAMUSCULAR | Qty: 1

## 2022-07-31 NOTE — Brief Op Note (Signed)
Date: 07/31/2022  Location: [facility] OR    Name: Keani Gotcher, DOB: April 10, 1955, MRN: [id number]    Diagnosis  Pre-op Diagnosis     * Prolapse of female pelvic organs [N81.9] Post-op Diagnosis     * Prolapse of female pelvic organs [N81.9]     Procedures  Hysterectomy, Vaginal, With Combined Anteroposterior Colporrhaphy  58260 - PR VAGINAL HYSTERECTOMY,UTERUS 250 GMS/<    PR CMBND Dewayne Hatch W/CYSTO [[id number]]  Surgeons      * Viviann Spare [facility] - Primary     * Richarda Osmond. Dorna Bloom - Assisting    Procedure Summary  Anesthesia: General  ASA: II  Estimated Blood Loss: 70 mL  Total IV Fluids: 1000 mL  Drains:   Urethral Catheter Non-latex (Active)      Specimens       ID Source Type Tests Collected By Collected At Frozen? Priority Lab ID    1 Uterus Tissue TISSUE PATHOLOGY   Harrell Gave, MD 07/31/22 1156 No      Description: uterus and cervix    2 Vagina Tissue TISSUE PATHOLOGY   Harrell Gave, MD 07/31/22 1201 No      Description: Vaginal mucosa           Staff:   Circulator: Laurier Nancy, RN  Relief Circulator: Sallee Lange, RN  Scrub Person: Herma Mering, RN; Louellen Molder, RN; Bryna Colander Admettre    Indications: [name] is an 67 y.o. female who is having surgery for Prolapse.      Findings: 3rd degree prolapse, cystocele and rectocele.  Normal uterus and ovaries.      Complications:  none     Disposition: PACU - hemodynamically stable.  Condition: stable  Specimens Collected:   Order Name Source Comment Collection Info Order Time   CONFIRM ABO/RH Blood, Venous  Collected By: Rober Minion 07/31/2022  9:18 AM   TISSUE PATHOLOGY Uterus Pre-op diagnosis:  Prolapse Collected By: Harrell Gave, MD 07/31/2022 11:57 AM     Attending Attestation: I was present and scrubbed for the entire procedure.        Harrell Gave  Phone Number: [phone]

## 2022-07-31 NOTE — Care Plan (Signed)
Problem: Adult Inpatient Plan of Care  Goal: Plan of Care Review  Outcome: Ongoing, Progressing  Goal: Patient-Specific Goal (Individualized)  Outcome: Ongoing, Progressing  Goal: Absence of Hospital-Acquired Illness or Injury  Outcome: Ongoing, Progressing  Goal: Optimal Comfort and Wellbeing  Outcome: Ongoing, Progressing  Intervention: Monitor Pain and Promote Comfort  Recent Flowsheet Documentation  Taken 07/31/2022 1945 by Barnet Glasgow, RN  Pain Management Interventions:   care clustered   position adjusted   pillow support provided   around-the-clock dosing utilized  Goal: Readiness for Transition of Care  Outcome: Ongoing, Progressing     Problem: Bleeding (Hysterectomy)  Goal: Absence of Bleeding  Outcome: Ongoing, Progressing     Problem: Bowel Motility Impaired (Hysterectomy)  Goal: Effective Bowel Elimination  Outcome: Ongoing, Progressing     Problem: Fluid and Electrolyte Imbalance (Hysterectomy)  Goal: Fluid and Electrolyte Balance  Outcome: Ongoing, Progressing     Problem: Infection (Hysterectomy)  Goal: Absence of Infection Signs and Symptoms  Outcome: Ongoing, Progressing     Problem: Ongoing Anesthesia Effects (Hysterectomy)  Goal: Anesthesia/Sedation Recovery  Outcome: Ongoing, Progressing     Problem: Pain (Hysterectomy)  Goal: Acceptable Pain Control  Outcome: Ongoing, Progressing  Intervention: Prevent or Manage Pain  Recent Flowsheet Documentation  Taken 07/31/2022 1945 by Barnet Glasgow, RN  Pain Management Interventions:   care clustered   position adjusted   pillow support provided   around-the-clock dosing utilized     Problem: Postoperative Nausea and Vomiting (Hysterectomy)  Goal: Nausea and Vomiting Relief  Outcome: Ongoing, Progressing     Problem: Postoperative Urinary Retention (Hysterectomy)  Goal: Effective Urinary Elimination  Outcome: Ongoing, Progressing     Problem: Respiratory Compromise (Hysterectomy)  Goal: Effective Oxygenation and Ventilation  Outcome: Ongoing,  Progressing   Goal Outcome Evaluation:  Plan of Care Reviewed With: patient

## 2022-07-31 NOTE — Nursing Note (Signed)
BASED ON PT AND PREVIOUS SHIFT RN REPORT, PT HAD AN ALLERGIC REACTION WHILE SHE WAS IN OR TO ONE OF THE MEDICATIONS GIVEN BY ANESTHESIA, PT HAD HIVES AND WAS GIVEN BENADRYL. SPOKE WITH DR Kellie Shropshire VIA PHONE AND MADE AWARE THAT THERE IS NO RECORD OF WHICH MEDICATION THE PT HAD A REACTION TO AND THE ALLERGY LIST WAS NOT UPDATED IN THE PT RECORD.Marland Kitchen   DR Kellie Shropshire SAID THAT MANY MEDICATIONS WHERE GIVEN AROUND THE SAME TIME AND ITS NOT POSSIBLE TO KNOW WHICH MEDICATION CAUSED AN ALLERGIC REACTION TO PT. DR Kellie Shropshire WILL FOLLOW UP WITH PT IN AM BEFORE DISCHARGE AND WILL DISCUSS PLAN OF CARE REGARDING THE ALLERGIC REACTION WITH PT. PT HAS NO S/S OF ANY DISTRESS AND PT IS FREE FROM HIVES OR ANY OTHER S/S OF ALLERGY REACTION. WILL CONTINUE TO MONITOR.

## 2022-07-31 NOTE — Anesthesia Procedure Notes (Signed)
Airway    Date/Time: 07/31/2022 11:02 AM    Performed by: Patrice Paradise, CRNA  Authorized by: Margo Aye, MD    Location:  OR  Urgency:  Elective  Difficult Airway: No    Induction Technique:  Intravenous  RSI: No    Cricoid pressure: No    ETT In Situ?: No    Anesthesiologist:  Marin Roberts, MD  Resident/CRNA:  Patrice Paradise, CRNA  Performed by:  Resident/CRNA   Marin Roberts, MD  Indications for Airway Management:  Anesthesia  Preoxygenated: Yes    Patient Position:  Sniffing  Mask Ventilation:  Easy mask  Final Airway Type:  Endotracheal airway  Final Endotracheal Airway:  ETT  Cuffed: Yes    Stylet: Yes    Technique Used for Successful ETT Placement:  Video laryngoscopy  Insertion Site:  Oral  Blade Type:  [place]  Laryngoscope Blade/Video laryngoscope Blade Size:  3  ETT Size (mm):  7.0   Marin Roberts, MD  Measured from:  Teeth  ETT Depth (cm):  21  Placement Verified by: auscultation and capnometry    Cormack-Lehane Classification:  Grade I - full view of glottis  Number of Attempts at Approach:  1

## 2022-07-31 NOTE — Nursing Note (Signed)
Tol toast po-denies nausea-assist to sit and stand at bedside-denies dizzy-abdominal and perineal discomfort with movement-light to mod sersanguinous vaginal flow with standing-tol; well-foley drained 350cc clear urine-med with colace and oxycodone-iv infsg-VB on

## 2022-07-31 NOTE — Nursing Note (Signed)
1600-transf from pacu via bed-very sleepy easily aroused-skin warm dry pink-abdomen soft-denies nausea or pain at present-iv infsg left hand -1000L/R-foley to CD clear yellow urine-assist to side lie-v/s stable

## 2022-07-31 NOTE — Op Note (Signed)
Operative Report   Patient Name:     [name]      Date of Service:     July 31, 2022        Patient ID:     [id number]     Date of Birth:     1955-05-23        Clinician:     Harrell Gave, MD           PREOPERATIVE DIAGNOSES:  Uterine prolapse, cystocele, rectocele, pelvic pain.     POSTOPERATIVE DIAGNOSES:  Uterine prolapse, cystocele, rectocele pelvic pain.     PROCEDURE:  Vaginal hysterectomy, anterior and posterior colporrhaphy.     ANESTHESIA:  General.     SURGEON:  Harrell Gave, MD     ASSISTANT:  Malachy Chamber, MD.  Dr. Haskell Riling assistance was necessary due to the complexity of the procedure and to enhance patient's safety.     TOTAL FLUIDS IN:  1000 mL of crystalloid.     ESTIMATED BLOOD LOSS:  70 mL.     DRAINS:  Foley catheter to gravity.     SPECIMEN TO PATHOLOGY:  Uterus and cervix, and portions of vaginal mucosa.     INDICATIONS:  Please refer to admission history and physical.     FINDINGS:  There was a third-degree uterine prolapse with third-degree cystocele and second-degree rectocele.  The uterus was normal size.  The ovaries were palpably normal.     COMPLICATIONS:  None.     DESCRIPTION OF PROCEDURE:  The patient was taken to the operating room and adequate general anesthesia was administered.  The patient was placed in the dorsal lithotomy position, was sterilely prepped and draped in the usual fashion.  Sequential compression sleeves were placed on the patient's legs and the patient was given 5000 units of heparin subcutaneously for DVT prophylaxis.  The patient was given Ancef intravenously for antibiotic prophylaxis.  The patient was correctly identified and a surgical pause and timeout was carried out by the surgical team as per protocol.  A weighted speculum was then placed in the vagina and the cervix was grasped with 2 tooth tenacula.  The cervicovaginal junction was infiltrated with 0.25% Marcaine solution with epinephrine.  A circumferential incision was made at the  cervicovaginal junction and then the vaginal mucosa was bluntly and sharply dissected off the lower cervix.  The posterior cul-de-sac was then entered and the uterosacral ligaments were clamped, cut, and suture ligated using 0 Vicryl suture bilaterally.  These were held to suspend the vaginal cuff at the completion of the procedure.  The uterine vessels were then clamped, cut, and suture ligated bilaterally using 0 Vicryl suture.  The remaining broad ligament tissue was clamped, cut, and suture ligated and then the proximal fallopian tubes and uteroovarian ligaments were clamped, cut, and suture ligated bilaterally using 0 Vicryl suture.  The uterus and cervix were removed and sent under cover to pathology.  There was a little bleeding at this time.  The ovaries were palpated and normal.  Attention was turned to the cystocele repair.  The vaginal mucosa in the midline overlying the cystocele anteriorly was infiltrated with the Marcaine and epinephrine solution and then the vertical incision was made in the midline over the cystocele.  The vaginal mucosa laterally was bluntly and sharply dissected off the cystocele sac.  The cystocele was reduced using interrupted 2-0 Vicryl Kelly plication sutures. The excess vaginal mucosa was trimmed and then the vaginal mucosa was closed  anteriorly using interrupted 2-0 Vicryl suture.  The Foley catheter was then inserted in the bladder and clear urine was obtained.  The vaginal cuff was then closed using a continuous 0 Vicryl suture.  The uterosacral ligament sutures were brought out through the edges of the vaginal cuff using a free needle.  Attention was then turned to the rectocele repair.  The peritoneum was infiltrated with the Marcaine and epinephrine solution as was the posterior vaginal mucosa in the midline.  A wedge of tissue was removed from the peritoneum superficially and then a midline incision was made overlying the rectocele sac on the posterior vaginal mucosa.   The posterior vaginal mucosa was then bluntly and sharply dissected off the rectocele sac bilaterally.  The excess vaginal mucosa was trimmed and then the posterior vaginal mucosa was closed using a continuous 2-0 Vicryl suture.  The rectocele was then reduced by suturing the levator ani together posteriorly to anteriorly.  The excess vaginal mucosa was trimmed and then the posterior vaginal mucosa was closed using a continuous 2-0 Vicryl suture.  The perineum was reconstructed using deep 0 Vicryl sutures and then the skin was closed using a continuous 2-0 Vicryl suture.  A rectal exam was then performed and no injury was noted to the rectum.  Procedure was completed.  The vagina was irrigated and no bleeding was noted.  All sponge and instrument counts were correct at the completion of the procedure.  The patient tolerated the procedure well and was taken to the recovery room in stable condition.                                                                                Harrell Gave, MD           Date Dictated:     08/02/2022        Date Transcribed:     08/02/2022        SD/THA   Job #: [id number]            cc:     Malachy Chamber, MD

## 2022-07-31 NOTE — Nursing Note (Signed)
Venodyne boots on-scant vaginal flow-family in to visit

## 2022-07-31 NOTE — Anesthesia Post-Procedure Evaluation (Signed)
Patient: Vincenza Dail    Procedure Summary       Date: 07/31/22 Room / Location: [facility] 03 / [facility]    Anesthesia Start: 1036 Anesthesia Stop: 1304    Procedure: Hysterectomy, Vaginal, With Combined Anteroposterior Colporrhaphy Diagnosis:       Prolapse of female pelvic organs      (Prolapse)    Surgeons: Harrell Gave, MD Responsible Provider: Margo Aye, MD    Anesthesia Type: general, regional ASA Status: 2            Anesthesia Type: general, regional    Vitals Value Taken Time   BP 105/48 07/31/22 1501   Temp 36.9 C (98.4 F) 07/31/22 1303   Pulse 70 07/31/22 1502   Resp 9 07/31/22 1502   SpO2 96 % 07/31/22 1502   Vitals shown include unfiled device data.    Anesthesia Post Evaluation Note:    Patient location during evaluation: PACU  Patient participation: able to participate  Level of consciousness: arousable  Cardiovascular and Hydration status: stable  Respiratory Status Stable and Airway Patent: yes  Nausea and Vomiting Control Satisfactory: yes  Pain management: adequate     Vitals reviewed: yes  Unplanned ICU Admission: no      No notable events documented.

## 2022-07-31 NOTE — Anesthesia Pre-Procedure Evaluation (Signed)
Patient: [name]    Procedure Information       Date/Time: 07/31/22 1035    Procedure: Hysterectomy, Vaginal, With Combined Anteroposterior Colporrhaphy - PAT Phone call is ok   23 HR. / BED    Location: [facility] 03 / [facility]    Surgeons: Harrell Gave, MD            Relevant Problems   Anesthesia (within normal limits)      Cardio (within normal limits)      Pulmonary (within normal limits)      GI   (+) Gastroesophageal reflux disease      GU/Renal (within normal limits)      Endo (within normal limits)      Hematology (within normal limits)      Neuro/Psych   (+) Chronic tension headache      Musculoskeletal   (+) Osteoarthritis      Development (within normal limits)      Genetic (within normal limits)       Clinical information reviewed:   Tobacco  Allergies  Meds   Med Hx  Surg Hx   Fam Hx  Soc Hx         Physical Exam    Airway  Mallampati: II  TM distance: >3 FB  Mouth opening: >3 FB  Neck ROM: full     Cardiovascular   Functional capacity:  greater than or equal to 4 METS without symptoms   Dental - normal exam     Pulmonary    Abdominal    General   Alert                   Anesthesia Plan    ASA 2   NPO status verified    general and regional     Airway: ETT  Monitoring: standard monitors  Postoperative Pain Control: IV/PO analgesics and Peripheral Nerve Block (consent obtained)    Multimodal PONV prophylaxis planned  Essential imaging and labs available and reviewed    Anesthetic plan and risks discussed with patient.  Use of blood products discussed with patient  Consented to blood products     Preprocedure Evaluation: No items outstanding.    Complex patient: No

## 2022-07-31 NOTE — Nursing Note (Signed)
Sitting up in bed-alert-visiting with family-tol sips fluid po-declines food or dinner-has some "lower abdominal pressure"-foley draining cl yellow urine-med with Tylenol 2 tabs po

## 2022-08-01 LAB — CBC WITH DIFFERENTIAL
Basophils %: 0.1 %
Basophils Absolute: 0.01 10*3/uL (ref 0.00–0.22)
Eosinophils %: 0.2 %
Eosinophils Absolute: 0.02 10*3/uL (ref 0.00–0.50)
Hematocrit: 35.7 % (ref 32.0–47.0)
Hemoglobin: 11.7 g/dL (ref 11.0–16.0)
Immature Granulocytes %: 0.3 %
Immature Granulocytes Absolute: 0.03 10*3/uL (ref 0.00–0.10)
Lymphocyte %: 26.4 %
Lymphocytes Absolute: 2.54 10*3/uL (ref 0.70–4.00)
MCH: 29 pg (ref 26.0–34.0)
MCHC: 32.8 g/dL (ref 31.0–37.0)
MCV: 88.6 fL (ref 80.0–100.0)
MPV: 10.8 fL (ref 9.1–12.4)
Monocytes %: 5.8 %
Monocytes Absolute: 0.56 10*3/uL (ref 0.36–0.77)
NRBC %: 0 % (ref 0.0–0.0)
NRBC Absolute: 0 10*3/uL (ref 0.00–2.00)
Neutrophil %: 67.2 %
Neutrophils Absolute: 6.45 10*3/uL (ref 1.50–7.95)
Platelets: 169 10*3/uL (ref 150–400)
RBC: 4.03 M/uL (ref 3.70–5.20)
RDW-CV: 13.4 % (ref 11.5–14.5)
RDW-SD: 43.6 fL (ref 35.0–51.0)
WBC: 9.6 10*3/uL (ref 4.0–11.0)

## 2022-08-01 MED ORDER — oxyCODONE (Roxicodone) 5 mg immediate release tablet
5 | ORAL_TABLET | Freq: Three times a day (TID) | ORAL | 0 refills | 8.00000 days | Status: AC | PRN
Start: 2022-08-01 — End: 2022-08-07

## 2022-08-01 MED ORDER — acetaminophen (Tylenol) 325 mg tablet
325 | ORAL_TABLET | Freq: Four times a day (QID) | ORAL | 0 refills | 8.00000 days | Status: AC | PRN
Start: 2022-08-01 — End: 2022-08-11

## 2022-08-01 MED ORDER — docusate sodium (Colace) 100 mg capsule
100 | ORAL_CAPSULE | Freq: Two times a day (BID) | ORAL | 0 refills | Status: DC
Start: 2022-08-01 — End: 2022-10-24

## 2022-08-01 MED FILL — ACETAMINOPHEN 325 MG TABLET: 325 325 mg | ORAL | Qty: 2

## 2022-08-01 MED FILL — OXYCODONE 5 MG TABLET: 5 5 mg | ORAL | Qty: 1

## 2022-08-01 MED FILL — KETOROLAC 15 MG/ML INJECTION SOLUTION: 15 15 mg/mL | INTRAMUSCULAR | Qty: 1

## 2022-08-01 MED FILL — DOCUSATE SODIUM 100 MG CAPSULE: 100 100 mg | ORAL | Qty: 1

## 2022-08-01 NOTE — Anesthesia Post-Procedure Evaluation (Signed)
Patient: [name]    Procedure Summary       Date: 08/17/22 Room / Location: [facility]    Anesthesia Start:  Anesthesia Stop:     Procedure: POST-OP Diagnosis:     Scheduled Providers: Harrell Gave, MD Responsible Provider:     Anesthesia Type: Not recorded ASA Status: Not recorded            Anesthesia Type: No value filed.    Vitals:    08/01/22 0311   BP: 128/53   Pulse: 69   Resp: 18   Temp: 36.7 C (98.1 F)   SpO2: 100%          Anesthesia Post Evaluation Note:    Patient location during evaluation: Floor  Patient participation: able to participate  Level of consciousness: awake  Cardiovascular and Hydration status: vital signs within acceptable range  Respiratory Status Stable and Airway Patent: yes  Nausea and Vomiting Control Satisfactory: yes  Pain management: adequate     Vitals reviewed: yes  Comments: Patient seen this morning to follow up with hives noted at end of OR case yesterday 08/01/22. All hives are gone this morning. Discussed at length with patient that multiple medications were administered during the course of her operation, and we are unable to pinpoint which medication this may have been caused by. Also explained to patient that most of her body is covered with sterile drapes during the surgery, except for the area needing exposure for operation. This makes it difficult to know if the hives started shortly after she went to sleep, or appeared towards the end of the case. Patient given list of all medications administered by anesthesia, two of which were lidocaine and propofol that patient had received in 2023 with no issues. Patient advised to follow up with allergist to determine root cause of reaction. Also educated patient that if list of medications is lost she can obtain her anesthesia record through medical records. Pt appreciative and thankful for follow up.         No notable events documented.

## 2022-08-01 NOTE — Care Plan (Signed)
Problem: Adult Inpatient Plan of Care  Goal: Plan of Care Review  08/01/2022 0103 by Barnet Glasgow, RN  Outcome: Ongoing, Progressing  07/31/2022 2019 by Barnet Glasgow, RN  Outcome: Ongoing, Progressing  Goal: Patient-Specific Goal (Individualized)  08/01/2022 0103 by Barnet Glasgow, RN  Outcome: Ongoing, Progressing  07/31/2022 2019 by Barnet Glasgow, RN  Outcome: Ongoing, Progressing  Goal: Absence of Hospital-Acquired Illness or Injury  08/01/2022 0103 by Barnet Glasgow, RN  Outcome: Ongoing, Progressing  07/31/2022 2019 by Barnet Glasgow, RN  Outcome: Ongoing, Progressing  Goal: Optimal Comfort and Wellbeing  08/01/2022 0103 by Barnet Glasgow, RN  Outcome: Ongoing, Progressing  07/31/2022 2019 by Barnet Glasgow, RN  Outcome: Ongoing, Progressing  Intervention: Monitor Pain and Promote Comfort  Recent Flowsheet Documentation  Taken 08/01/2022 0000 by Barnet Glasgow, RN  Pain Management Interventions:   medication administered   care clustered   position adjusted   pillow support provided   around-the-clock dosing utilized  Taken 07/31/2022 1945 by Barnet Glasgow, RN  Pain Management Interventions:   care clustered   position adjusted   pillow support provided   around-the-clock dosing utilized  Goal: Readiness for Transition of Care  08/01/2022 0103 by Barnet Glasgow, RN  Outcome: Ongoing, Progressing  07/31/2022 2019 by Barnet Glasgow, RN  Outcome: Ongoing, Progressing     Problem: Bleeding (Hysterectomy)  Goal: Absence of Bleeding  08/01/2022 0103 by Barnet Glasgow, RN  Outcome: Ongoing, Progressing  07/31/2022 2019 by Barnet Glasgow, RN  Outcome: Ongoing, Progressing     Problem: Bowel Motility Impaired (Hysterectomy)  Goal: Effective Bowel Elimination  08/01/2022 0103 by Barnet Glasgow, RN  Outcome: Ongoing, Progressing  07/31/2022 2019 by Barnet Glasgow, RN  Outcome: Ongoing, Progressing     Problem: Fluid and Electrolyte Imbalance (Hysterectomy)  Goal: Fluid and Electrolyte Balance  08/01/2022 0103 by  Barnet Glasgow, RN  Outcome: Ongoing, Progressing  07/31/2022 2019 by Barnet Glasgow, RN  Outcome: Ongoing, Progressing     Problem: Infection (Hysterectomy)  Goal: Absence of Infection Signs and Symptoms  08/01/2022 0103 by Barnet Glasgow, RN  Outcome: Ongoing, Progressing  07/31/2022 2019 by Barnet Glasgow, RN  Outcome: Ongoing, Progressing     Problem: Ongoing Anesthesia Effects (Hysterectomy)  Goal: Anesthesia/Sedation Recovery  08/01/2022 0103 by Barnet Glasgow, RN  Outcome: Ongoing, Progressing  07/31/2022 2019 by Barnet Glasgow, RN  Outcome: Ongoing, Progressing     Problem: Pain (Hysterectomy)  Goal: Acceptable Pain Control  08/01/2022 0103 by Barnet Glasgow, RN  Outcome: Ongoing, Progressing  07/31/2022 2019 by Barnet Glasgow, RN  Outcome: Ongoing, Progressing  Intervention: Prevent or Manage Pain  Recent Flowsheet Documentation  Taken 08/01/2022 0000 by Barnet Glasgow, RN  Pain Management Interventions:   medication administered   care clustered   position adjusted   pillow support provided   around-the-clock dosing utilized  Taken 07/31/2022 1945 by Barnet Glasgow, RN  Pain Management Interventions:   care clustered   position adjusted   pillow support provided   around-the-clock dosing utilized     Problem: Postoperative Nausea and Vomiting (Hysterectomy)  Goal: Nausea and Vomiting Relief  08/01/2022 0103 by Barnet Glasgow, RN  Outcome: Ongoing, Progressing  07/31/2022 2019 by Barnet Glasgow, RN  Outcome: Ongoing, Progressing     Problem: Postoperative Urinary Retention (Hysterectomy)  Goal: Effective Urinary Elimination  08/01/2022 0103 by Barnet Glasgow, RN  Outcome: Ongoing, Progressing  07/31/2022 2019 by Barnet Glasgow, RN  Outcome: Ongoing, Progressing     Problem: Respiratory Compromise (Hysterectomy)  Goal: Effective Oxygenation and Ventilation  08/01/2022 0103 by Barnet Glasgow, RN  Outcome: Ongoing, Progressing  07/31/2022 2019 by Barnet Glasgow, RN  Outcome: Ongoing, Progressing   Goal  Outcome Evaluation:  Plan of Care Reviewed With: patient

## 2022-08-01 NOTE — Nursing Note (Signed)
PT AMBULATED IN ROOM AND HALLWAY X2 TONIGHT WITH SLOW STEADY GAIT, PT TOLERATED AMBULATION WELL. THIS RN ASSISTED PT WITH PERI CARE IN BR X2 DURING THIS SHIFT. PT IS USING IS AND ENCOURAGED TO COUGH, DEEP BREATH AND TAKE PO FLUIDS.  PT HAS V.BOUTS ON. PT ASSESSMENT IS WNL.

## 2022-08-01 NOTE — Other (Signed)
Patient Education  Table of Contents   Vaginal Hysterectomy, Care After    To view videos and all your education online visit,  [email or web address].elsevier.com/[email or web address]  or scan this QR code with your smartphone.  Access to this content will expire in one year.  Vaginal Hysterectomy, Care After  After a vaginal hysterectomy, it's common to have pain in the lower abdomen and vagina. You'll have bleeding and discharge from the vagina. You may also have:   Constipation.   Trouble peeing and emptying your bladder.   Tiredness.   Sadness and other emotions.  If your ovaries were taken out, you may also have symptoms of menopause, such as hot flashes, night sweats, and lack of sleep.  Follow these instructions at home:  Medicines     Take over-the-counter and prescription medicines only as told by your provider.   If you were given antibiotics, take them as told by your provider. Do not stop taking the antibiotic even if you start to feel better.   Ask your provider if the medicine prescribed to you:  ? Requires you to avoid driving or using machinery.  ? Can cause constipation. To prevent or treat constipation, you may need to:  ? Drink enough fluid to keep your pee (urine) pale yellow.  ? Take over-the-counter or prescription medicines.  ? Eat foods that are high in fiber, such as beans, whole grains, and fresh fruits and vegetables.  ? Limit foods that are high in fat and processed sugars, such as fried or sweet foods.  Activity     Rest as told by your provider.   Do not sit for a long time without moving. Get up to take short walks every 1?2 hours. This will improve blood flow and breathing. Ask for help if you feel weak or unsteady.   Try to have someone home with you for 1?2 weeks to help you with everyday chores.   You may have to avoid lifting.Ask your provider how much you can safely lift.   Return to your normal activities as told by your provider. Ask your provider what activities are safe for you.  Lifestyle   Do not  use any products that contain nicotine or tobacco. These products include cigarettes, chewing tobacco, and vaping devices, such as e-cigarettes. These can delay healing after surgery. If you need help quitting, ask your provider.   Do not drink alcohol until your provider approves.  General instructions   Do not douche, use tampons, have sex, or put anything in your vagina for at least 6 weeks.   If you struggle with physical or emotional changes after your surgery, talk with your provider or a therapist.   The stitches inside your vagina will dissolve over time and do not need to be taken out.   Do not take baths, swim, or use a hot tub until your provider approves. You may only be allowed to take showers for 2?3 weeks.   Wear compression stockings as told by your provider. These stockings help to prevent blood clots and reduce swelling in your legs.  Your provider may give you more instructions. Make sure you know what you can and can't do.  Contact a health care provider if:   Your pain medicine is not helping.   You have a fever.   You vomit or feel like you may vomit, and the symptoms do not go away.   You feel dizzy or light-headed.   You have pus or discharge from  your vagina that smells bad.   You have trouble peeing.  Get help right away if:   You have severe pain in your abdomen or back.   You faint.   You have heavy bleeding from your vagina that soaks through a pad in less than 1 hour.   You see blood clots in your bleeding.   You have chest pain or shortness of breath.   You have pain, swelling, or redness in your leg.  These symptoms may be an emergency. Get help right away. Call 911.   Do not wait to see if the symptoms will go away.   Do not drive yourself to the hospital.  This information is not intended to replace advice given to you by your health care provider. Make sure you discuss any questions you have with your health care provider.  Document Released: 2015-04-19 Document Updated: 2022-04-07  Document Reviewed: 2022-04-07  Elsevier Patient Education ? 2024 [employer]

## 2022-08-01 NOTE — Care Plan (Signed)
Problem: Adult Inpatient Plan of Care  Goal: Optimal Comfort and Wellbeing  Intervention: Monitor Pain and Promote Comfort  Recent Flowsheet Documentation  Taken 08/01/2022 0923 by Domingo Pulse, RN  Pain Management Interventions:   medication administered   care clustered  Taken 08/01/2022 0745 by Domingo Pulse, RN  Pain Management Interventions:   care clustered   position adjusted   around-the-clock dosing utilized     Problem: Pain (Hysterectomy)  Goal: Acceptable Pain Control  Intervention: Prevent or Manage Pain  Flowsheets  Taken 08/01/2022 0923  Pain Management Interventions:   medication administered   care clustered  Taken 08/01/2022 0745  Pain Management Interventions:   care clustered   position adjusted   around-the-clock dosing utilized     Problem: Postoperative Urinary Retention (Hysterectomy)  Goal: Effective Urinary Elimination  Intervention: Monitor and Manage Urinary Retention  Flowsheets (Taken 08/01/2022 1202)  Urinary Elimination Promotion:   toileting offered   frequent voiding encouraged   Goal Outcome Evaluation:  Plan of Care Reviewed With: patient      Plan of care discussed with patient. Patient reoriented to room and call bell. Call bell within reach. Patient encouraged to call RN for any assistance.  See flow sheet and MAR as documented.

## 2022-08-01 NOTE — Discharge Summary (Signed)
Discharge Summary    Discharge Diagnosis  Uterine prolapse    Hospital Course    The pt was admitted for vaginal hysterectomy and anterior and posterior repair.  There were no complications.  Post op the pt c/o pelvic pain.  The pt is ambulating, voiding and tolerating a reg diet.  Pain is controlled.  Will d/c home    Test Results Pending At Discharge  Pending Labs       Order Current Status    Tissue Pathology In process          Pertinent Physical Exam At Time of Discharge  Physical Exam  Vitals and nursing note reviewed.   Constitutional:       Appearance: Normal appearance.   HENT:      Head: Normocephalic and atraumatic.      Right Ear: External ear normal.      Left Ear: External ear normal.      Nose: Nose normal.      Mouth/Throat:      Mouth: Mucous membranes are moist.   Eyes:      Extraocular Movements: Extraocular movements intact.      Conjunctiva/sclera: Conjunctivae normal.   Cardiovascular:      Rate and Rhythm: Normal rate and regular rhythm.      Heart sounds: Normal heart sounds.   Pulmonary:      Effort: Pulmonary effort is normal.      Breath sounds: Normal breath sounds.   Abdominal:      General: Abdomen is flat.      Palpations: Abdomen is soft.   Skin:     General: Skin is warm and dry.   Neurological:      General: No focal deficit present.      Mental Status: She is alert and oriented to person, place, and time.   Psychiatric:         Mood and Affect: Mood normal.         Behavior: Behavior normal.         Thought Content: Thought content normal.         Judgment: Judgment normal.         Issues Requiring Follow-Up  F/u 2 weeks            Outpatient Follow-Up  Future Appointments   Date Time Provider Department Center   08/17/2022 11:30 AM Harrell Gave, MD [facility]   10/24/2022  1:00 PM Pat Kocher, MD [id number] [facility]   01/16/2023  8:00 AM [facility] [id number] [place]   03/12/2023 11:00 AM [facility] [id number]    03/20/2023   9:00 AM Rosina Lowenstein, MD [facility]   06/01/2023 10:30 AM Richarda Osmond. Dorna Bloom, MD [facility] [place]

## 2022-08-01 NOTE — Care Plan (Signed)
Problem: Adult Inpatient Plan of Care  Goal: Plan of Care Review  Outcome: Met  Goal: Patient-Specific Goal (Individualized)  Outcome: Met  Goal: Absence of Hospital-Acquired Illness or Injury  Outcome: Met  Goal: Optimal Comfort and Wellbeing  Outcome: Met  Intervention: Monitor Pain and Promote Comfort  Recent Flowsheet Documentation  Taken 08/01/2022 0923 by Domingo Pulse, RN  Pain Management Interventions:   medication administered   care clustered  Taken 08/01/2022 0745 by Domingo Pulse, RN  Pain Management Interventions:   care clustered   position adjusted   around-the-clock dosing utilized  Goal: Readiness for Transition of Care  Outcome: Met     Problem: Bleeding (Hysterectomy)  Goal: Absence of Bleeding  Outcome: Met     Problem: Bowel Motility Impaired (Hysterectomy)  Goal: Effective Bowel Elimination  Outcome: Met     Problem: Fluid and Electrolyte Imbalance (Hysterectomy)  Goal: Fluid and Electrolyte Balance  Outcome: Met     Problem: Infection (Hysterectomy)  Goal: Absence of Infection Signs and Symptoms  Outcome: Met     Problem: Ongoing Anesthesia Effects (Hysterectomy)  Goal: Anesthesia/Sedation Recovery  Outcome: Met     Problem: Pain (Hysterectomy)  Goal: Acceptable Pain Control  Outcome: Met  Intervention: Prevent or Manage Pain  Flowsheets  Taken 08/01/2022 0923  Pain Management Interventions:   medication administered   care clustered  Taken 08/01/2022 0745  Pain Management Interventions:   care clustered   position adjusted   around-the-clock dosing utilized     Problem: Postoperative Nausea and Vomiting (Hysterectomy)  Goal: Nausea and Vomiting Relief  Outcome: Met     Problem: Postoperative Urinary Retention (Hysterectomy)  Goal: Effective Urinary Elimination  Outcome: Met  Intervention: Monitor and Manage Urinary Retention  Flowsheets (Taken 08/01/2022 1202)  Urinary Elimination Promotion:   toileting offered   frequent voiding encouraged     Problem: Respiratory Compromise  (Hysterectomy)  Goal: Effective Oxygenation and Ventilation  Outcome: Met   Goal Outcome Evaluation:  Plan of Care Reviewed With: patient

## 2022-08-03 LAB — TISSUE PATHOLOGY

## 2022-08-17 ENCOUNTER — Ambulatory Visit
Admit: 2022-08-17 | Discharge: 2022-08-17 | Payer: MEDICARE | Attending: Obstetrics & Gynecology | Primary: Family Medicine

## 2022-08-17 DIAGNOSIS — R102 Pelvic and perineal pain: Secondary | ICD-10-CM

## 2022-08-17 NOTE — Progress Notes (Signed)
[facility] [place]  [address] [place] [address]  [phone]    Patient ID: [name] is a 67 y.o. female who presents for follow up.    Subjective   HPI: The pt is here for follow up.  S/p vag hyst A-P repair 2 weeks ago.  Pain has improved.  No bleeding.  Voiding well    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    Retina disorder     bilateral,    gets injections monthly    Vitamin D deficiency         Past Surgical History:   Procedure Laterality Date    COLONOSCOPY          Allergies   Allergen Reactions    Influenza Vaccine Tr-S 11 (Pf) Swelling    Penicillins Hives and Rash    Amitriptyline Palpitations    Boostrix Tdap [Diphth,Pertus(Acell),Tetanus] Rash    Flu Virus Vaccine Tv 2015-16 (18 Yr And Up),Recomb Rash    Sulfa (Sulfonamide Antibiotics) Rash          Current Outpatient Medications:     alendronate (Fosamax) 70 mg tablet, TAKE 1 TABLET BY MOUTH ONE TIME PER WEEK (Patient taking differently: Take 70 mg by mouth. TAKE 1 TABLET BY MOUTH ONE TIME PER WEEK on Mondays), Disp: 12 tablet, Rfl: 3    aspirin 81 mg EC tablet, Take 81 mg by mouth once daily., Disp: , Rfl:     calcium citrate-vitamin D3 (Citracal + D Maximum) 315 mg-6.25 mcg (250 unit) tablet, Take 1 tablet by mouth once daily., Disp: , Rfl:     cetirizine (ZyrTEC) 10 mg tablet, Take 1 tablet (10 mg) by mouth once daily. (Patient taking differently: Take 10 mg by mouth if needed each day.), Disp: 90 tablet, Rfl: 2    docusate sodium (Colace) 100 mg capsule, Take 1 capsule (100 mg) by mouth twice daily., Disp: 60 capsule, Rfl: 0    estradiol (Estrace) 0.01 % (0.1 mg/gram) vaginal cream, Insert 2 g into the vagina at bedtime.  At bedtime for 2 weeks, then at bedtime twice a week., Disp: 34 g, Rfl: 3    lutein 6 mg capsule, Take 6 mg by mouth., Disp: , Rfl:     multivitamin with minerals tablet, Take 1 tablet by mouth once daily., Disp: , Rfl:     omeprazole (PriLOSEC) 20 mg DR capsule, TAKE 1 CAPSULE (20 MG) BY MOUTH IF NEEDED EACH DAY (DYSPEPSIA)., Disp: 90 capsule, Rfl: 1     Social History     Socioeconomic History    Marital status: Married     Spouse name: Not on file    Number of children: Not on file    Years of education: Not on file    Highest education level: Not on file   Occupational History    Not on file   Tobacco Use    Smoking status: Never     Passive exposure: Never    Smokeless tobacco: Never   Vaping Use    Vaping Use: Never used   Substance  and Sexual Activity    Alcohol use: Not Currently     Comment: rare    Drug use: Never    Sexual activity: Defer   Other Topics Concern    Not on file   Social History Narrative    Not on file     Social Determinants of Health     Financial Resource Strain: Not on file   Food Insecurity: Not on file   Transportation Needs: Not on file   Physical Activity: Not on file   Stress: Not on file   Social Connections: Not on file   Intimate Partner Violence: Not on file   Housing Stability: Not on file       No family history on file.     {Objective   Visit Vitals  BP 130/78   Wt 56.7 kg   LMP  (LMP Unknown)   BMI 27.05 kg/m   OB Status Postmenopausal   BSA 1.51 m       Review of Systems   Constitutional: Negative.    HENT: Negative.     Eyes: Negative.    Respiratory: Negative.     Cardiovascular: Negative.    Gastrointestinal: Negative.    Endocrine: Negative.    Genitourinary:  Positive for vaginal pain.   Musculoskeletal: Negative.    Skin: Negative.    Allergic/Immunologic: Negative.    Neurological: Negative.    Hematological: Negative.    Psychiatric/Behavioral: Negative.     All other systems reviewed and are negative.       Physical Exam  Vitals and nursing note reviewed. Exam  conducted with a chaperone present.   Constitutional:       Appearance: Normal appearance.   HENT:      Head: Normocephalic and atraumatic.      Right Ear: External ear normal.      Left Ear: External ear normal.      Nose: Nose normal.      Mouth/Throat:      Mouth: Mucous membranes are moist.   Eyes:      Extraocular Movements: Extraocular movements intact.      Conjunctiva/sclera: Conjunctivae normal.   Genitourinary:     General: Normal vulva.      Exam position: Lithotomy position.      Vagina: Normal.      Comments: Sutures intact, healing well  Skin:     General: Skin is dry.   Neurological:      General: No focal deficit present.      Mental Status: She is alert and oriented to person, place, and time.   Psychiatric:         Mood and Affect: Mood normal.         Behavior: Behavior normal.         Thought Content: Thought content normal.         Judgment: Judgment normal.         Assessment/Plan   Pelvic organ prolapse  Vaginal pain    Stable following vaginal hysterectomy, A-P repair  NSAIDs as needed for pain  Pt will follow up with Dr Dorna Bloom for GYN care

## 2022-08-24 ENCOUNTER — Ambulatory Visit
Admit: 2022-08-24 | Discharge: 2022-08-24 | Payer: MEDICARE | Attending: Obstetrics & Gynecology | Primary: Family Medicine

## 2022-08-24 DIAGNOSIS — R102 Pelvic and perineal pain: Secondary | ICD-10-CM

## 2022-08-24 DIAGNOSIS — N952 Postmenopausal atrophic vaginitis: Secondary | ICD-10-CM

## 2022-08-24 MED ORDER — estradiol (Estrace) 0.01 % (0.1 mg/gram) vaginal cream
0.01 | Freq: Every evening | VAGINAL | 3 refills | Status: AC
Start: 2022-08-24 — End: ?

## 2022-08-24 NOTE — Progress Notes (Signed)
[facility] [place]  [address] [place] [address]  [phone]    Patient ID: [name] is a 67 y.o. female who presents for vaginal and pelvic pain.    Subjective   HPI: Pt is here for follow up.  S/p vag hyst A-P repair.  No bleeding.  C/o pain in vagina and pelvis.  The pt has been very active, doing housework, cooking, Air cabin crew my previous instructions.      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    Retina disorder     bilateral,    gets injections monthly    Vitamin D deficiency         Past Surgical History:   Procedure Laterality Date    COLONOSCOPY          Allergies   Allergen Reactions    Influenza Vaccine Tr-S 11 (Pf) Swelling    Penicillins Hives and Rash    Amitriptyline Palpitations    Boostrix Tdap [Diphth,Pertus(Acell),Tetanus] Rash    Flu Virus Vaccine Tv 2015-16 (18 Yr And Up),Recomb Rash    Sulfa (Sulfonamide Antibiotics) Rash          Current Outpatient Medications:     alendronate (Fosamax) 70 mg tablet, TAKE 1 TABLET BY MOUTH ONE TIME PER WEEK (Patient taking differently: Take 70 mg by mouth. TAKE 1 TABLET BY MOUTH ONE TIME PER WEEK on Mondays), Disp: 12 tablet, Rfl: 3    aspirin 81 mg EC tablet, Take 81 mg by mouth once daily., Disp: , Rfl:     calcium citrate-vitamin D3 (Citracal + D Maximum) 315 mg-6.25 mcg (250 unit) tablet, Take 1 tablet by mouth once daily., Disp: , Rfl:     cetirizine (ZyrTEC) 10 mg tablet, Take 1 tablet (10 mg) by mouth once daily. (Patient taking differently: Take 10 mg by mouth if needed each day.), Disp: 90 tablet, Rfl: 2    docusate sodium (Colace) 100 mg capsule, Take 1 capsule (100 mg) by mouth twice daily., Disp: 60 capsule, Rfl:  0    lutein 6 mg capsule, Take 6 mg by mouth., Disp: , Rfl:     multivitamin with minerals tablet, Take 1 tablet by mouth once daily., Disp: , Rfl:     omeprazole (PriLOSEC) 20 mg DR capsule, TAKE 1 CAPSULE (20 MG) BY MOUTH IF NEEDED EACH DAY (DYSPEPSIA)., Disp: 90 capsule, Rfl: 1     Social History     Socioeconomic History    Marital status: Married     Spouse name: Not on file    Number of children: Not on file    Years of education: Not on file    Highest education level: Not on file   Occupational History    Not on file   Tobacco Use    Smoking status: Never     Passive exposure: Never    Smokeless tobacco: Never   Vaping Use    Vaping Use: Never used   Substance and Sexual Activity    Alcohol use: Not Currently     Comment: rare  Drug use: Never    Sexual activity: Defer   Other Topics Concern    Not on file   Social History Narrative    Not on file     Social Determinants of Health     Financial Resource Strain: Not on file   Food Insecurity: Not on file   Transportation Needs: Not on file   Physical Activity: Not on file   Stress: Not on file   Social Connections: Not on file   Intimate Partner Violence: Not on file   Housing Stability: Not on file       No family history on file.     {Objective   Visit Vitals  BP (!) 140/80   Wt 56.2 kg   LMP  (LMP Unknown)   BMI 26.83 kg/m   OB Status Postmenopausal   BSA 1.5 m       Review of Systems   Constitutional: Negative.    HENT: Negative.     Eyes: Negative.    Respiratory: Negative.     Cardiovascular: Negative.    Gastrointestinal: Negative.    Endocrine: Negative.    Genitourinary:  Positive for pelvic pain and vaginal pain.   Musculoskeletal: Negative.    Skin: Negative.    Allergic/Immunologic: Negative.    Neurological: Negative.    Hematological: Negative.    Psychiatric/Behavioral: Negative.     All other systems reviewed and are negative.       Physical Exam  Vitals and nursing note reviewed. Exam conducted with a chaperone present.   Constitutional:        Appearance: Normal appearance.   HENT:      Head: Normocephalic and atraumatic.      Right Ear: External ear normal.      Left Ear: External ear normal.      Nose: Nose normal.      Mouth/Throat:      Mouth: Mucous membranes are moist.   Eyes:      Extraocular Movements: Extraocular movements intact.      Conjunctiva/sclera: Conjunctivae normal.   Abdominal:      General: Abdomen is flat.      Palpations: Abdomen is soft.   Genitourinary:     General: Normal vulva.      Exam position: Lithotomy position.      Vagina: Normal.      Comments: Sutures intact, healing well  Skin:     General: Skin is warm and dry.   Neurological:      General: No focal deficit present.      Mental Status: She is alert and oriented to person, place, and time.   Psychiatric:         Mood and Affect: Mood normal.         Behavior: Behavior normal.         Thought Content: Thought content normal.         Judgment: Judgment normal.         Assessment/Plan   Vaginal pain  Pelvic pain    Pt was advised again to decrease activity  Start Estrace cream

## 2022-09-26 ENCOUNTER — Ambulatory Visit
Admit: 2022-09-26 | Discharge: 2022-09-26 | Payer: MEDICARE | Attending: Obstetrics & Gynecology | Primary: Family Medicine

## 2022-09-26 DIAGNOSIS — R102 Pelvic and perineal pain: Secondary | ICD-10-CM

## 2022-09-26 NOTE — Progress Notes (Signed)
NAME:[name]   DOB:February 11, 1955   ZOX:[id number]    DATE OF VISIT: 09/26/2022    Chaperone offered - declined  Primary care doctor:    Problem: visit  CHIEF COMPLAINT: PT HAD SURGERY 07/31/22 lifted bags 09/23/22 and has had pain since    TVH/anteriro repaired  She did well on Monday   She lifts 2 glocery bag dnd she complain pain   Normal bowel movement yet and normal urinaty       This is a new gyn problem    Other c/o:  none  LMP:No LMP recorded (lmp unknown). Patient is postmenopausal.          Last PE with pcp:     last urine with pcp:      Review of Systems   Constitutional: Negative.    HENT: Negative.     Eyes: Negative.    Respiratory: Negative.     Cardiovascular: Negative.    Gastrointestinal: Negative.    Endocrine: Negative.    Genitourinary: Negative.    Musculoskeletal: Negative.    Skin: Negative.    Allergic/Immunologic: Negative.    Neurological: Negative.       OBJECTIVE VS  BP (!) 143/78   Wt 56.2 kg   LMP  (LMP Unknown)   BMI 26.83 kg/m      Patient Active Problem List   Diagnosis    Osteoporosis (CMS-HCC)    Osteoarthritis    Benign neoplasm of colon, unspecified    Elevated blood pressure reading without diagnosis of hypertension    Vision disorder    Functional dyspepsia    Vitamin D deficiency    Chronic tension headache    Allergic rhinitis    RUQ pain    Gastroesophageal reflux disease        Current Outpatient Medications   Medication Sig Dispense Refill    alendronate (Fosamax) 70 mg tablet TAKE 1 TABLET BY MOUTH ONE TIME PER WEEK (Patient taking differently: Take 70 mg by mouth. TAKE 1 TABLET BY MOUTH ONE TIME PER WEEK on Mondays) 12 tablet 3    aspirin 81 mg EC tablet Take 81 mg by mouth once daily.      calcium citrate-vitamin D3 (Citracal + D Maximum) 315 mg-6.25 mcg (250 unit) tablet Take 1 tablet by mouth once daily.      cetirizine (ZyrTEC) 10 mg tablet Take 1 tablet (10 mg) by mouth once daily. (Patient taking differently: Take 10 mg by mouth if needed each day.) 90 tablet 2     docusate sodium (Colace) 100 mg capsule Take 1 capsule (100 mg) by mouth twice daily. 60 capsule 0    estradiol (Estrace) 0.01 % (0.1 mg/gram) vaginal cream Insert 2 g into the vagina at bedtime. At bedtime for 2 weeks, then at bedtime twice a week. 34 g 3    lutein 6 mg capsule Take 6 mg by mouth.      multivitamin with minerals tablet Take 1 tablet by mouth once daily.      omeprazole (PriLOSEC) 20 mg DR capsule TAKE 1 CAPSULE (20 MG) BY MOUTH IF NEEDED EACH DAY (DYSPEPSIA). 90 capsule 1     No current facility-administered medications for this visit.          Physical Exam    SE-neosporin     Assessment/Plan      Pelvic pain  Status post surgery doing well till she starts doing lifting  Pelvic exam is within normal limit   Neosporin placed on  introtus      No orders of the defined types were placed in this encounter.         Patient was advised to check with her insurance company to sure that all tests ordered today are covered by her insurance company before heading for the tests.      Instruction:     Advice to return to office in 1-2 weeks

## 2022-10-02 ENCOUNTER — Encounter: Payer: MEDICARE | Attending: Obstetrics & Gynecology | Primary: Family Medicine

## 2022-10-03 ENCOUNTER — Ambulatory Visit
Admit: 2022-10-03 | Discharge: 2022-10-03 | Payer: MEDICARE | Attending: Obstetrics & Gynecology | Primary: Family Medicine

## 2022-10-03 DIAGNOSIS — R102 Pelvic and perineal pain: Secondary | ICD-10-CM

## 2022-10-03 NOTE — Progress Notes (Signed)
NAME:[name]   DOB:1955/08/27   ZOX:[id number]    DATE OF VISIT: 10/03/2022    Chaperone offered - declined  Primary care doctor:    Problem: visit  CHIEF COMPLAINT: f/u last week vaginal pain and pt states doing much better     Pain resolved 90% now and will not return to work       This is a follow up from last visit on             09/26/22            Other c/o: none  LMP:No LMP recorded (lmp unknown). Patient is postmenopausal.          Last PE with pcp:     last urine with pcp:      Review of Systems   Constitutional: Negative.    HENT: Negative.     Eyes: Negative.    Respiratory: Negative.     Cardiovascular: Negative.    Gastrointestinal: Negative.    Endocrine: Negative.    Genitourinary: Negative.    Musculoskeletal: Negative.    Skin: Negative.    Allergic/Immunologic: Negative.    Neurological: Negative.       OBJECTIVE VS  Wt 58.1 kg   LMP  (LMP Unknown)   BMI 27.70 kg/m      Patient Active Problem List   Diagnosis    Osteoporosis (CMS-HCC)    Osteoarthritis    Benign neoplasm of colon, unspecified    Elevated blood pressure reading without diagnosis of hypertension    Vision disorder    Functional dyspepsia    Vitamin D deficiency    Chronic tension headache    Allergic rhinitis    RUQ pain    Gastroesophageal reflux disease      Current Outpatient Medications   Medication Sig Dispense Refill    aspirin 81 mg EC tablet Take 81 mg by mouth once daily.      calcium citrate-vitamin D3 (Citracal + D Maximum) 315 mg-6.25 mcg (250 unit) tablet Take 1 tablet by mouth once daily.      docusate sodium (Colace) 100 mg capsule Take 1 capsule (100 mg) by mouth twice daily. 60 capsule 0    estradiol (Estrace) 0.01 % (0.1 mg/gram) vaginal cream Insert 2 g into the vagina at bedtime. At bedtime for 2 weeks, then at bedtime twice a week. 34 g 3    lutein 6 mg capsule Take 6 mg by mouth.      multivitamin with minerals tablet Take 1 tablet by mouth once daily.      omeprazole (PriLOSEC) 20 mg DR capsule TAKE 1 CAPSULE  (20 MG) BY MOUTH IF NEEDED EACH DAY (DYSPEPSIA). 90 capsule 1     No current facility-administered medications for this visit.        Physical Exam      Abdomen: soft, non tender with no referred or palpational pain     Pelvic examination:    Vulva: Normal appearance, normal hair distribution, no lesions or masses.     Urethra: Normal, no masses, non-tender, no discharge.     Vagina: Normal, rugated, physiologic discharge, no lesions, no masses, adequate pelvic support.     Cervix: none    Uterus: none    Adnexa: Normal, no masses, mobile, nontender.     Rectum: EXTERNAL HEMORRHOIDS.        Assessment/Plan      Pelvic pain  Resolved 90% with rest   She will quit her  job for @ least 6 months      No orders of the defined types were placed in this encounter.      Patient was advised to check with her insurance company to sure that all tests ordered today are covered by her insurance company before heading for the tests.        Instruction:     Advice to return to office as needed if she has any problems if desires treatment/consult/visit   Keep an on 03/20/2023

## 2022-10-24 ENCOUNTER — Ambulatory Visit: Admit: 2022-10-24 | Discharge: 2022-10-24 | Payer: MEDICARE | Attending: Family Medicine | Primary: Family Medicine

## 2022-10-24 DIAGNOSIS — Z0000 Encounter for general adult medical examination without abnormal findings: Secondary | ICD-10-CM

## 2022-10-24 NOTE — Assessment & Plan Note (Signed)
On omeprazole as needed  We discussed the potential risks of long-term use of PPI including decreased bone strength, CKD, dementia, hypomagnesemia and risk of certain infections, although data supporting the potential for some of these side effects remains limited.

## 2022-10-24 NOTE — Patient Instructions (Signed)
Please schedule your annual dermatology visit to screen for skin cancer:  1. Liliane Bade, MD  [facility]  [address], [address]  [place], [place] [address]  THURSDAYS: 8:00AM to 4:30PM  Please call [phone]      2. [facility]  [email or web address]   [address], [place], [place] [address]  ~13.2 mi   [phone]      3. [facility]  For assistance and to schedule an appointment at one of our locations, please contact us at Phone: [phone]  -[facility]  [address] [place], [place] [address]  -[facility] [place] - Location I  [address] [place], [place] [address]  -[facility] [place] - Location II  [address] [place] [address]  -[facility]  [address], [place], [place] [address]  -[facility]  [address] [place], [place] [address]

## 2022-10-24 NOTE — Assessment & Plan Note (Signed)
Reviewed prevention guidelines including healthy diet, regular exercise, STD prevention, screenings that are due, dental cleanings, vaccines.    The patient is reminded to see a dermatologist annually to screen for skin cancer.The patient is reminded to see an eye doctor annually to screen for ocular disease and cancer.The patient is reminded to see the dentist regularly, brush and floss to prevent heart disease.  Reviewed need of first colonoscopy at 43 and the follow the recommendations made for follow up colonoscopy and FIT/stool testing to screen for colon cancer.  The patient should have her first pap smear at age 44 and then repeat paps based on recommendation to screen for cervical cancer. The patient should have her first mammogram at age 78 or earlier if there is a family history of early breast cancer. The mammogram should be repeated annually to screen for breast cancer.  Please see above for review of health care maintenance.  Functional ability and safety reviewed.  cognitive impairment reviewed.  risk factors and interventionrecommendations reviewed.  bmi reviewed.  Immunizationand screening plan established and reviewed.  List of current providers reviewed and updated.Necessary referrals reviewed and provided.  Provided health advice regarding prevention including but not limited to hazards of smoking, fall prevention and geriatric nutrition.

## 2022-10-24 NOTE — Assessment & Plan Note (Signed)
Elevated fasting glucose noted in prior labs  Check hgbA1C

## 2022-10-24 NOTE — Assessment & Plan Note (Addendum)
Followed by Dr Tedra Senegal  She was on alendronate from 2009 to 03-2013.She has been on drug holiday till 03-2018.  Had been on risedronate before that.   Restarted fosamax 70 mg weekly 2020 by PCP and has been on it for 3 years with improvement in BMD. Her spinal BMD has declined since 2014 but probably not significant--it is still higher that it was compared to 2011 as bisphos worked. Hip BMD now worst. Endo discussed options of both Reclast versus Prolia .  She had initially wanted to do Reclast but is very afraid of side effects and today wanted Prolia.  But it appears that she does not have a drug coverage.  She has been oral Fosamax again since 03-2019, F/U BMD 2023,Improved.   total 5 years of oral bisphos. Misunderstood and stopped nov 2023 and asked to restart   f/u BMD 03-2023

## 2022-10-24 NOTE — Assessment & Plan Note (Signed)
Elevated TC and LDL in labs from 2019  Recheck lipids  Can consider statin based on ASCVD Risk is ?%

## 2022-10-24 NOTE — Assessment & Plan Note (Signed)
Colonoscopy in 1.2023 with Dr Chestine Spore, repeat in 5 years.

## 2022-10-24 NOTE — Assessment & Plan Note (Signed)
S/P Hysterectomy, Vaginal, With Combined Anteroposterior Colporrhaphy  on 7.22.24.  Had pelvic pain which has now resolved.

## 2022-10-24 NOTE — Assessment & Plan Note (Signed)
Normotensive, continue to monitor.  Check renal functions and lipids.

## 2022-10-24 NOTE — Progress Notes (Signed)
[facility]  [facility] [address]  [address]  [place] [address]  Dept: [phone]  Dept Fax: [phone]     Patient ID: [name] is a 67 y.o. female who presents for Initial Medicare Annual Wellness Visit.    Subjective   HPI    #Pelvic pain  Saw GYN on 10/03/22  Resolved 90% with rest   Retired from work      Review of Health Maintenance:  Healthcare Proxy -    Form provided.  Functional ability and safety:  , reviewed.  cognitive impairment:  , reviewed.      HM Screening labs -   Last Colonoscopy: Colonoscopy in 01/2021 with Dr Chestine Spore, repeat in 5 years.     Last Pap: @ 61 negative   07/31/22: Hysterectomy, Vaginal, With Combined Anteroposterior Colporrhaphy     Mammogram:  === 01/10/22 ===  BI BILATERAL MAMMOGRAM SCREENING TOMOSYNTHESIS    - Impression -  Negative, no evidence of malignancy. Annual screening mammogram is recommended.  BI-RADS: Category 1: Negative  RECOMMENDATION: Routine screening mammogram in 1 year  Marina Goodell, MD 01/10/2022 11:04 AM    Bone density:   === 03/09/21 ===  BD DEXA AXIAL  - Impression -  Osteoporosis  [name] 03/10/2021 7:45 AM      Ophthalmology: UTD Dr Donette Larry- retina, Dr Levada Schilling   Dermatology:enc  Dentist:enc; Recommend regular dental visits every 6 months.  Referral provided if needed.  AAA screening:    low dose lung cancer screening:        Patient Active Problem List   Diagnosis    Osteoporosis (CMS-HCC)    Osteoarthritis    Benign neoplasm of colon, unspecified    Elevated blood pressure reading without diagnosis of hypertension    Vision disorder    Functional dyspepsia    Vitamin D deficiency    Chronic tension headache    Allergic rhinitis    RUQ pain    Gastroesophageal reflux disease    Preventative health care    S/P vaginal hysterectomy    Mixed hyperlipidemia (CMS-HCC)    Hyperglycemia     Current Outpatient Medications   Medication Instructions     calcium citrate-vitamin D3 (Citracal + D Maximum) 315 mg-6.25 mcg (250 unit) tablet 1 tablet, oral, Daily    estradiol (ESTRACE) 2 g, vaginal, Nightly, At bedtime for 2 weeks, then at bedtime twice a week.    multivitamin with minerals tablet 1 tablet, oral, Daily    omeprazole (PRILOSEC) 20 mg, oral, Daily PRN     Allergies   Allergen Reactions    Influenza Vaccine Tr-S 11 (Pf) Swelling    Penicillins Hives and Rash    Amitriptyline Palpitations    Boostrix Tdap [Diphth,Pertus(Acell),Tetanus] Rash    Flu Virus Vaccine Tv 2015-16 (18 Yr And Up),Recomb Rash    Sulfa (Sulfonamide Antibiotics) Rash     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  Social History     Tobacco Use    Smoking status: Never     Passive exposure: Never    Smokeless tobacco: Never   Vaping Use    Vaping Use: Never used   Substance Use Topics    Alcohol use: Not Currently     Comment: rare    Drug use: Never       Health Risk Assessment    Psychosocial Risk  Loneliness/Social Isolation - Talk on the phone:  (!) Never  Loneliness/Social Isolation - Belong to clubs or organizations:  (!) No  Patient Health Questionnaire-2 Score: 0Behavioral Risk  Have you seen a dentist in the past year?: (!) No Alcohol - How often do you drink?: Never  Alcohol - How many drinks per day?: Patient does not drink    Objective   Visit Vitals  BP 130/70 (BP Location: Right arm, Patient Position: Sitting, BP Cuff Size: Adult)   Pulse 75   Temp 36.4 C (97.5 F) (Temporal)   Ht 1.473 m   Wt 57.6 kg   LMP  (LMP Unknown)   SpO2 99%    BMI 26.54 kg/m   OB Status Postmenopausal   BSA 1.54 m       Physical Exam  Vitals reviewed.   Constitutional:       Appearance: Normal appearance.   HENT:      Head: Normocephalic and atraumatic.      Right Ear: Tympanic membrane, ear canal and external ear normal.      Left Ear: Tympanic membrane, ear canal and external ear normal.      Nose: Nose normal.      Mouth/Throat:      Mouth: Mucous membranes are moist.      Pharynx: Oropharynx is clear. No oropharyngeal exudate.   Eyes:      Conjunctiva/sclera: Conjunctivae normal.      Pupils: Pupils are equal, round, and reactive to light.   Neck:      Vascular: No carotid bruit.   Cardiovascular:      Rate and Rhythm: Normal rate and regular rhythm.      Heart sounds: Normal heart sounds. No murmur heard.  Pulmonary:      Effort: Pulmonary effort is normal.      Breath sounds: Normal breath sounds. No wheezing.   Abdominal:      General: Bowel sounds are normal.      Palpations: Abdomen is soft. There is no mass.      Tenderness: There is no abdominal tenderness.   Musculoskeletal:         General: Normal range of motion.      Cervical back: Normal range of motion. No tenderness.   Lymphadenopathy:      Cervical: No cervical adenopathy.      Upper Body:      Right upper body: No supraclavicular adenopathy.      Left upper body: No supraclavicular adenopathy.   Skin:     General: Skin is warm.   Neurological:      General: No focal deficit present.      Mental Status: She is alert and oriented to person, place, and time. Mental status is at baseline.         Mini Cog:   Three Word Registration (ex. Apple, Lynnae Prude): Three Words Recall  Assessment/Plan   [name] was seen today for initial medicare annual wellness visit.  Medicare annual wellness visit, initial  Preventative health care  Assessment & Plan:  Reviewed prevention guidelines including healthy diet, regular exercise, STD prevention, screenings that are due, dental cleanings, vaccines.    The  patient is reminded to see a dermatologist annually to screen for skin cancer.The patient is reminded to see an eye doctor annually to screen for ocular disease and cancer.The patient is reminded to see the dentist regularly, brush and floss to prevent heart disease.  Reviewed need of first colonoscopy at 43 and the follow the recommendations made for follow up colonoscopy and FIT/stool testing to screen for colon cancer.  The patient should have her first pap smear at age 69 and then repeat paps based on recommendation to screen for cervical cancer. The patient should have her first mammogram at age 35 or earlier if there is a family history of early breast cancer. The mammogram should be repeated annually to screen for breast cancer.  Please see above for review of health care maintenance.  Functional ability and safety reviewed.  cognitive impairment reviewed.  risk factors and interventionrecommendations reviewed.  bmi reviewed.  Immunizationand screening plan established and reviewed.  List of current providers reviewed and updated.Necessary referrals reviewed and provided.  Provided health advice regarding prevention including but not limited to hazards of smoking, fall prevention and geriatric nutrition.    Age-related osteoporosis without current pathological fracture (CMS-HCC)  Assessment & Plan:  Followed by Dr Tedra Senegal  She was on alendronate from 2009 to 03-2013.She has been on drug holiday till 03-2018.  Had been on risedronate before that.   Restarted fosamax 70 mg weekly 2020 by PCP and has been on it for 3 years with improvement in BMD. Her spinal BMD has declined since 2014 but probably not significant--it is still higher that it was compared to 2011 as bisphos worked. Hip BMD now worst. Endo discussed options of both Reclast versus Prolia .  She had initially wanted to do Reclast but is very afraid of side effects and today wanted Prolia.  But it appears that she does not have a drug coverage.  She  has been oral Fosamax again since 03-2019, F/U BMD 2023,Improved.   total 5 years of oral bisphos. Misunderstood and stopped nov 2023 and asked to restart   f/u BMD 03-2023    Orders:  -     CBC  -     Comprehensive metabolic panel  -     TSH with reflex  Benign neoplasm of colon, unspecified part of colon  Assessment & Plan:  Colonoscopy in 1.2023 with Dr Chestine Spore, repeat in 5 years.    Elevated blood pressure reading without diagnosis of hypertension  Assessment & Plan:  Normotensive, continue to monitor.  Check renal functions and lipids.  Orders:  -     CBC  -     Comprehensive metabolic panel  -     TSH with reflex  -     Lipid panel  Hyperglycemia  Assessment & Plan:  Elevated fasting glucose noted in prior labs  Check hgbA1C  Orders:  -     TSH with reflex  -     Lipid panel  -     Hemoglobin A1c  Gastroesophageal reflux disease, unspecified whether esophagitis present  Assessment & Plan:  On omeprazole as needed  We discussed the potential risks of long-term use of PPI including decreased bone strength,  CKD, dementia, hypomagnesemia and risk of certain infections, although data supporting the potential for some of these side effects remains limited.    Prediabetes  -     TSH with reflex  Abnormal finding of blood chemistry, unspecified  Mixed hyperlipidemia (CMS-HCC)  Assessment & Plan:  Elevated TC and LDL in labs from 2019  Recheck lipids  Can consider statin based on ASCVD Risk is ?%    Orders:  -     TSH with reflex  -     Lipid panel  S/P vaginal hysterectomy  Assessment & Plan:  S/P Hysterectomy, Vaginal, With Combined Anteroposterior Colporrhaphy  on 7.22.24.  Had pelvic pain which has now resolved.      Advance Care Planning  Advance Directive/Living Will: Yes   Health Care Power of Attorney: Yes  Time in minutes Spent Discussing Advanced Directives: 1-15 minutes    Medicare Screenings Reviewed  PHQ-2  GAD-7  ADL/iADL  Domestic violence  Physical activity  Patient Health Questionnaire-2 Score: 0    Interpretation: Negative screening.     Follow-up & Interventions: Maintain annual screening - No additional Follow-up required  Generalized Anxiety Disorder Screening - GAD-7 Score: 0  Interpretation: Negative screening.  Follow up & Intervention: Maintain annual screening - No additional Follow-up required            Patient Care Team:  Pat Kocher, MD as PCP - General (Family Medicine)  Bary Castilla, MD (Family Medicine)    Major risk factors and recommendations:         Follow up in about 1 year (around 10/24/2023) for annual.    Patient provided verbal consent to use of HIPAA compliant Voice transcribing service.    Electronically signed by: Pat Kocher, MD 10/24/2022 1:50 PM

## 2022-10-24 NOTE — Progress Notes (Signed)
VENI COMPLETED

## 2022-10-25 LAB — COMPREHENSIVE METABOLIC PANEL
ALT: 23 IU/L (ref 0–32)
AST: 26 IU/L (ref 0–40)
Albumin: 4.2 g/dL (ref 3.9–4.9)
Alk Phosphatase: 58 IU/L (ref 44–121)
Anion Gap: 15 mmol/L (ref 10.0–18.0)
BUN/Creat Ratio: 19 (ref 12–28)
BUN: 14 mg/dL (ref 8–27)
Bili Total: 0.3 mg/dL (ref 0.0–1.2)
Calcium: 9.4 mg/dL (ref 8.7–10.3)
Carbon Dioxide: 25 mmol/L (ref 20–29)
Chloride: 100 mmol/L (ref 96–106)
Creat: 0.72 mg/dL (ref 0.57–1.00)
Globulin Total: 2.5 g/dL (ref 1.5–4.5)
Glucose: 114 mg/dL — ABNORMAL HIGH (ref 70–99)
Potassium: 3.7 mmol/L (ref 3.5–5.2)
Protein Total: 6.7 g/dL (ref 6.0–8.5)
Sodium: 140 mmol/L (ref 134–144)
eGFR: 90 mL/min/{1.73_m2} (ref >59)

## 2022-10-25 LAB — CBC
Hct: 42.4 % (ref 34.0–46.6)
Hgb: 13.8 g/dL (ref 11.1–15.9)
MCH: 29.8 pg (ref 26.6–33.0)
MCHC: 32.5 g/dL (ref 31.5–35.7)
MCV: 92 fL (ref 79–97)
Platelets: 207 10*3/uL (ref 150–450)
RBC: 4.63 x10E6/uL (ref 3.77–5.28)
RDW: 12.2 % (ref 11.7–15.4)
WBC: 5.6 10*3/uL (ref 3.4–10.8)

## 2022-10-25 LAB — LIPID PANEL
Cholesterol: 226 mg/dL — ABNORMAL HIGH (ref 100–199)
HDL Cholesterol: 94 mg/dL (ref >39)
LDLc Calc (NIH): 115 mg/dL — ABNORMAL HIGH (ref 0–99)
Non-HDL Chol: 132 mg/dL — ABNORMAL HIGH (ref 0–129)
Triglycerides: 97 mg/dL (ref 0–149)
VLDLc Calc: 17 mg/dL (ref 5–40)

## 2022-10-25 LAB — TSH WITH REFLEX: TSH: 0.975 u[IU]/mL (ref 0.450–4.500)

## 2022-10-25 LAB — HEMOGLOBIN A1C: HgbA1C: 5.2 % (ref 4.8–5.6)

## 2022-11-06 MED ORDER — alendronate (Fosamax) 70 mg tablet
70 | ORAL_TABLET | ORAL | 3 refills | Status: AC
Start: 2022-11-06 — End: 2023-11-06

## 2022-11-06 NOTE — Telephone Encounter (Signed)
Initial prescription was coming from primary care.  I sent her Fosamax

## 2022-11-06 NOTE — Telephone Encounter (Signed)
-----   Message from [name] sent at 11/05/2022  2:53 PM EDT -----  Regarding: Alendronate/fosamax   Contact: [phone]  Need refill for this no more available

## 2022-11-14 ENCOUNTER — Ambulatory Visit
Admit: 2022-11-14 | Discharge: 2022-11-14 | Payer: MEDICARE | Attending: Obstetrics & Gynecology | Primary: Family Medicine

## 2022-11-14 DIAGNOSIS — R102 Pelvic and perineal pain: Secondary | ICD-10-CM

## 2022-11-14 NOTE — Patient Instructions (Signed)
Follow up treatment of vaginal cuff granulation tissue

## 2022-11-14 NOTE — Progress Notes (Signed)
NAME:[name]   DOB:[birth year]   ZOX:[id number]    DATE OF VISIT: 11/14/2022    Chaperone offered - declined  Primary care doctor:    Problem: visit  CHIEF COMPLAINT:Pt states her vaginal pain came back for last week   She had this pain before her diagnosis uterine prolapse and she had surgery TVH and her pain resolved and now returned .   Strongly advise to make an appointment with a GI doctor and primary care doctor. I suspect her pain may not due to her uterus since her transvaginal ultrasound is within normal limit  I will order CT advise and pelvis in the mean time    This is a new gyn problem 11/14/2022    Other c/o: none  LMP:No LMP recorded (lmp unknown). Patient is postmenopausal.          Last PE with pcp:     last urine with pcp:        Review of Systems   Constitutional: Negative.    HENT: Negative.     Eyes: Negative.    Respiratory: Negative.     Cardiovascular: Negative.    Gastrointestinal: Negative.    Endocrine: Negative.    Genitourinary: Negative.    Musculoskeletal: Negative.    Skin: Negative.    Allergic/Immunologic: Negative.    Neurological: Negative.         OBJECTIVE VS  BP 128/80   Wt 57.6 kg   LMP  (LMP Unknown)   BMI 26.54 kg/m      Patient Active Problem List   Diagnosis    Osteoporosis (CMS-HCC)    Osteoarthritis    Benign neoplasm of colon, unspecified    Elevated blood pressure reading without diagnosis of hypertension    Vision disorder    Functional dyspepsia    Vitamin D deficiency    Chronic tension headache    Allergic rhinitis    RUQ pain    Gastroesophageal reflux disease    Preventative health care    S/P vaginal hysterectomy    Mixed hyperlipidemia (CMS-HCC)    Hyperglycemia        Current Outpatient Medications   Medication Sig Dispense Refill    alendronate (Fosamax) 70 mg tablet Take 1 tablet (70 mg) by mouth every 7 (seven) days. Take in morning with full glass of water on an empty stomach. No food, drink, or meds for 30 minutes after. Do not lie down until after  first meal of day (to avoid throat irritation). 12 tablet 3    calcium citrate-vitamin D3 (Citracal + D Maximum) 315 mg-6.25 mcg (250 unit) tablet Take 1 tablet by mouth once daily.      estradiol (Estrace) 0.01 % (0.1 mg/gram) vaginal cream Insert 2 g into the vagina at bedtime. At bedtime for 2 weeks, then at bedtime twice a week. 34 g 3    multivitamin with minerals tablet Take 1 tablet by mouth once daily.      omeprazole (PriLOSEC) 20 mg DR capsule TAKE 1 CAPSULE (20 MG) BY MOUTH IF NEEDED EACH DAY (DYSPEPSIA). 90 capsule 1     No current facility-administered medications for this visit.        Physical Exam  Genitourinary:     Comments: Cuff granulation tissue  Silver nitrate stick applied   Neosporin applied          Assessment/Plan      Diagnoses and all orders for this visit:  Vaginal pain  -  Urinalysis; Future  -     Urine culture; Future  -     Urine culture  Acute cystitis without hematuria  -     Urinalysis; Future  -     Urine culture; Future  -     Urine culture  Abdominal pain, unspecified abdominal location  Normal bowel movement yet period patient  Strongly advise to make an appointment with her primary care doctor and GI     Strongly advise to avoid dairy and gluten       Orders Placed This Encounter   Procedures    Urine culture     Standing Status:   Future     Number of Occurrences:   1     Standing Expiration Date:   11/14/2023    Urinalysis     Standing Status:   Future     Standing Expiration Date:   11/14/2023          Patient was advised to check with her insurance company to sure that all tests ordered today are covered by her insurance company before heading for the tests.      Instruction:     See recommendation as above  Abdomen/pelvic CT   Advice to return to office after CT   Strongly advise to make an appointment with her primary care doctor and GI doctor or perhaps to rule out pain from her back if all work up are negative

## 2022-11-16 ENCOUNTER — Ambulatory Visit: Admit: 2022-11-16 | Discharge: 2022-11-16 | Payer: MEDICARE | Attending: Women's Health | Primary: Family Medicine

## 2022-11-16 DIAGNOSIS — N952 Postmenopausal atrophic vaginitis: Secondary | ICD-10-CM

## 2022-11-16 MED ORDER — fluconazole (Diflucan) 150 mg tablet
150 | ORAL_TABLET | Freq: Once | ORAL | 0 refills | 10.00000 days | Status: AC
Start: 2022-11-16 — End: 2022-11-16

## 2022-11-16 NOTE — Progress Notes (Addendum)
[facility] [place]  [address] [place] [address]  [phone]    Patient ID: [name] is a 67 y.o. female who presents for PT WAS IN OFFICE ON TUESDAY AND DR WU GAVE HER ESTRACE CREAM  THE LAST TIME SHE USED THE CREAM IT HELPED BUT NOT THIS TIME  PT IS STILL HAVING VAGINAL PAIN  SHE DOES NOT HAVE ANY DYSURIA BUT SINCE SHE HAS HAD HER SURGERY FOR BLADDER PROLAPSE SHE HAS HAD PAIN.  PT COULD NOT GIVE URINE SAMPLE BUT IF NEEDED SHE WILL LEAVE A SAMPLE AT THE END OF THE VISIT  Chaperone: decline    Date: 11/16/22  CC: vaginal pain  Right at opening, even before hysterectomy  Also deeper inside  No odor/itch/discharge/bleeding  No urinary symptoms  No constipation/diarrhea    Was given vaginal estrogen in past but ran out  Just started again on Thursday    No new ex  Other c/o:  TVUS 05/23/22 - IMPRESSION:  1. Trace fluid in the endometrial canal. Otherwise normal uterus.  2. Bilateral ovaries not visualized.  3. Bladder is incompletely filled, with reported cystocele not assessed on the basis of this study. Dedicated ultrasound of the bladder or MRI of the pelvis is recommended.    07/2022 - vaginal hysterectomy and anterior and posterior repair.     Last BDT: 03/2021 osteoporosis    Last colonoscopy:  2019 - tubular adenoma    Last Mammogram: 01/10/22 birads 1,    Last pap: 2018 - NILM  H/o abn pap: denies    Sexually active: no   Lives w/:  Work: retired in 04/2022          Current Outpatient Medications   Medication Instructions    alendronate (FOSAMAX) 70 mg, oral, Every 7 days, Take in morning with full glass of water on an empty stomach. No food, drink, or meds for 30 minutes after. Do not lie down until after first meal of day (to avoid throat irritation).    calcium citrate-vitamin D3 (Citracal + D Maximum) 315 mg-6.25 mcg (250 unit) tablet 1 tablet, oral, Daily    estradiol (ESTRACE) 2 g, vaginal, Nightly, At bedtime for 2 weeks, then at bedtime twice a  week.    fluconazole (DIFLUCAN) 150 mg, oral, Once, Repeat in 7 days if symptoms persist.    multivitamin with minerals tablet 1 tablet, oral, Daily    omeprazole (PRILOSEC) 20 mg, oral, Daily PRN     Allergies   Allergen Reactions    Influenza Vaccine Tr-S 11 (Pf) Swelling    Penicillins Hives and Rash    Amitriptyline Palpitations    Boostrix Tdap [Diphth,Pertus(Acell),Tetanus] Rash    Flu Virus Vaccine Tv 2015-16 (18 Yr And Up),Recomb Rash    Sulfa (Sulfonamide Antibiotics) Rash     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    Retina disorder     bilateral,    gets injections monthly    Vitamin D deficiency      Past Surgical History:   Procedure Laterality Date    COLONOSCOPY  TOTAL VAGINAL HYSTERECTOMY           Review of Systems   Constitutional:  Negative for chills, fatigue and fever.   Genitourinary:  Positive for vaginal pain. Negative for dyspareunia, dysuria, menstrual problem, pelvic pain, vaginal bleeding and vaginal discharge.        Objective   Visit Vitals  BP 135/78   Ht 1.473 m   Wt 57.6 kg   LMP  (LMP Unknown)   BMI 26.54 kg/m   OB Status Postmenopausal   BSA 1.54 m         Physical Exam  Constitutional:       Appearance: Normal appearance. She is not ill-appearing.   Genitourinary:     General: Normal vulva.      Labia:         Right: No rash, tenderness or lesion.         Left: No rash, tenderness or lesion.       Vagina: Vaginal discharge and erythema present. No tenderness or lesions.   Neurological:      Mental Status: She is alert.         Assessment/Plan   Encounter Diagnosis   Name Primary?    Atrophic vaginitis Yes     Red and thick white discharge  Diflucan sent in  Culture done    reviewed to avoid soaps, thongs, tight  fitting underwear, panty liners, or other irritants  wear loose cotton underwear only,  no underwear at night, loose clothing only  adv condom use as indicated    Advised re-start estrogen  Nightly for 2 weeks, then twice weekly  Rto in 2 weeks     Urine sent out

## 2022-11-16 NOTE — Addendum Note (Signed)
Addended byFerd Hibbs on: 11/16/2022 01:04 PM     Modules accepted: Orders

## 2022-11-17 LAB — URINALYSIS REFLEX TO CULTURE
Bilirubin Ur: NEGATIVE
Glucose Ur: NEGATIVE
Ketones Ur: NEGATIVE
Nitrite Ur: NEGATIVE
Occult Blood Ur: NEGATIVE
Protein Ur: NEGATIVE
Specific Gravity Ur: 1.007 (ref 1.005–1.030)
Urobilinogen,Semi-Qn Ur: 0.2 mg/dL (ref 0.2–1.0)
pH Ur: 7.5 (ref 5.0–7.5)

## 2022-11-17 LAB — URINE MICROSCOPIC (REFLEX ONLY)
Bacteria Ur: NONE SEEN
Casts Ur: NONE SEEN /LPF
RBC Ur: NONE SEEN /HPF (ref 0–2)
WBC Ur: NONE SEEN /HPF (ref 0–5)

## 2022-11-17 MED ORDER — clindamycin (Cleocin) 2 % vaginal cream
2 | Freq: Every evening | VAGINAL | 0 refills | 7.00000 days | Status: AC
Start: 2022-11-17 — End: 2022-11-24

## 2022-11-18 LAB — URINE CULTURE

## 2022-11-20 LAB — GENITAL CULTURE: Genital Culture: NO GROWTH

## 2022-11-23 ENCOUNTER — Ambulatory Visit: Admit: 2022-11-23 | Discharge: 2022-11-23 | Payer: MEDICARE | Attending: Family Medicine | Primary: Family Medicine

## 2022-11-23 DIAGNOSIS — R1030 Lower abdominal pain, unspecified: Secondary | ICD-10-CM

## 2022-11-23 NOTE — Progress Notes (Signed)
[facility]  [facility] [place]  [address]  [place] [address]  Dept: [phone]  Dept Fax: [phone]     Patient ID: [name] is a 67 y.o. female who presents for Follow-up.  NA was present who provided provided independent history during this visit.        Subjective   HPI    #LBP  Lower abd pain  No fevers  Normal appetite  2 weeks ago lot of pain in the vaginal area where the stitches were.  She saw GYN, and was told had granulation tissues  She returned  again because was not  feeling better and saw NP and treated with diflucan and clindamycin vaginal.  On Sunday LBP started and worsening since the past 5 days.  Also entire lower abdomen is sore.  No dysuria, no frequency, no hematuria  could be a side effect of the Cleocin (it can cause stomach upset).  Significant PSH of vaginal hysterectomy and anterior and posterior repair 07/2022.        === 05/24/22 ===    CT ABDOMEN PELVIS W CONTRAST    - Impression -  No acute or significant abnormality is seen.      [name] 05/24/2022 6:03 PM    Objective   Visit Vitals  BP (!) 140/84   Pulse 70   Temp 36.5 C (97.7 F) (Temporal)   Ht 1.473 m   Wt 57.6 kg   LMP  (LMP Unknown)   SpO2 99%   BMI 26.55 kg/m   OB Status Postmenopausal   BSA 1.54 m       Physical Exam  Vitals reviewed.   Constitutional:       Appearance: Normal appearance.   HENT:      Head: Normocephalic.   Cardiovascular:      Rate and Rhythm: Normal rate and regular rhythm.      Heart sounds: No murmur heard.  Pulmonary:      Effort: Pulmonary effort is normal.      Breath sounds: Normal breath sounds. No wheezing.   Abdominal:      Tenderness: There is abdominal tenderness in the right lower quadrant, suprapubic area and left lower quadrant.   Musculoskeletal:      Lumbar back: Tenderness (mid lumbar back) present.   Skin:     General: Skin is warm.   Neurological:      Mental Status:  She is alert and oriented to person, place, and time.         Assessment/Plan   Problem List Items Addressed This Visit          Medium    Lower abdominal pain - Primary     Significant pain and tenderness over lower abdominal/pelvic area with radiation to lower lumbar back.  Significant PSH of vaginal hysterectomy and anterior and posterior repair 07/2022.  She has recently seen GYN with normal pelvic exam and was treated with diflucan, Cleomycin vaginal cream. She was then taken off of cleocin cream as it may have caused side effects.  Will check UA  Will get pelvic US to r/o any pelvic pathology.  Advised ER for severe pain.  She has upcoming appt with GYN on 11/19.                 Relevant Orders    Urinalysis reflex to culture    US PELVIC NON OB WITH TRANSVAGINAL  Other Visit Diagnoses       Acute midline low back pain without sciatica                    No follow-ups on file.    Patient provided verbal consent to use of HIPAA compliant Voice transcribing service.  Electronically signed by: Pat Kocher, MD 11/23/2022

## 2022-11-23 NOTE — Patient Instructions (Signed)
If you do not hear back from the office regarding your appointment for specialist or imaging, please call central scheduling to assist in booking your appointment fro [facility] at [phone].

## 2022-11-23 NOTE — Assessment & Plan Note (Signed)
Significant pain and tenderness over lower abdominal/pelvic area with radiation to lower lumbar back.  Significant PSH of vaginal hysterectomy and anterior and posterior repair 07/2022.  She has recently seen GYN with normal pelvic exam and was treated with diflucan, Cleomycin vaginal cream. She was then taken off of cleocin cream as it may have caused side effects.  Will check UA  Will get pelvic US to r/o any pelvic pathology.  Advised ER for severe pain.  She has upcoming appt with GYN on 11/19.

## 2022-11-24 ENCOUNTER — Inpatient Hospital Stay: Admit: 2022-11-24 | Payer: MEDICARE | Primary: Family Medicine

## 2022-11-24 DIAGNOSIS — R1030 Lower abdominal pain, unspecified: Secondary | ICD-10-CM

## 2022-11-24 LAB — URINALYSIS REFLEX TO CULTURE
Bilirubin Ur: NEGATIVE
Glucose Ur: NEGATIVE
Ketones Ur: NEGATIVE
Nitrite Ur: NEGATIVE
Occult Blood Ur: NEGATIVE
Protein Ur: NEGATIVE
Specific Gravity Ur: 1.012 (ref 1.005–1.030)
Urobilinogen,Semi-Qn Ur: 0.2 mg/dL (ref 0.2–1.0)
pH Ur: 7 (ref 5.0–7.5)

## 2022-11-24 LAB — URINE CULTURE, ROUTINE (REFLEX ONLY)

## 2022-11-24 LAB — URINE MICROSCOPIC (REFLEX ONLY)
Bacteria Ur: NONE SEEN
Casts Ur: NONE SEEN /LPF
RBC Ur: NONE SEEN /HPF (ref 0–2)
WBC Ur: NONE SEEN /HPF (ref 0–5)

## 2022-11-24 NOTE — Result Quicknote (Signed)
Pelvic ultrasound is normal.  Urine labs are normal.  Please keep your scheduled appointment with Dr Dorna Bloom tomorrow.  Please let me know if pain persists or changes.

## 2022-11-28 ENCOUNTER — Ambulatory Visit
Admit: 2022-11-28 | Discharge: 2022-11-28 | Payer: MEDICARE | Attending: Obstetrics & Gynecology | Primary: Family Medicine

## 2022-11-28 DIAGNOSIS — R102 Pelvic and perineal pain: Secondary | ICD-10-CM

## 2022-11-28 NOTE — Telephone Encounter (Signed)
Hi Nicole, could you please help me with this?  Thank you     MRI AUTHORIZATION     Order ID: : [id number]    Procedure with CPT:  CT: Abdomen Pelvis w and without Contrast    ICD 10 w/ description:  Pelvic pain (R10.2)  Generalized abdominal Pain (10.84)  Bloating (r14.0)     Reason for Exam:  Bloating and pelvic pain tus is inconclusive    Ordering MD: Richarda Osmond. Dorna Bloom  NPI: [id number]     Performing Facility: [facility]     Insurance:    [employer]       Member ID:  [id number]  [id number]

## 2022-11-28 NOTE — Progress Notes (Signed)
NAME:[name]   DOB:08/22/1955   GNF:[id number]    DATE OF VISIT: 11/28/2022    Chaperone offered - declined  Primary care doctor:    Problem: visit  CHIEF COMPLAINT: F/U OF VAGINAL CUFF GRANULATION TISSUE   PT SAYS THE AREA IS VERY SORE  PT ALSO SAYS SHE HAS A VERY SWOLLEN AND PAINFUL ABDOMEN      Advise GI appointment [place]  Advoid daiy and gluten    This is a follow up from last visit on         11/14/2022                Other c/o: none  LMP:No LMP recorded (lmp unknown). Patient is postmenopausal.          Last PE with pcp:     last urine with pcp:      Review of Systems   Constitutional: Negative.    HENT: Negative.     Eyes: Negative.    Respiratory: Negative.     Cardiovascular: Negative.    Gastrointestinal: Negative.    Endocrine: Negative.    Genitourinary: Negative.    Musculoskeletal: Negative.    Skin: Negative.    Allergic/Immunologic: Negative.    Neurological: Negative.       OBJECTIVE VS  BP (!) 156/76   Ht 1.473 m   Wt 58.1 kg   LMP  (LMP Unknown)   BMI 26.75 kg/m      Patient Active Problem List   Diagnosis    Osteoporosis (CMS-HCC)    Osteoarthritis    Benign neoplasm of colon, unspecified    Elevated blood pressure reading without diagnosis of hypertension    Vision disorder    Functional dyspepsia    Vitamin D deficiency    Chronic tension headache    Allergic rhinitis    RUQ pain    Gastroesophageal reflux disease    Preventative health care    S/P vaginal hysterectomy    Mixed hyperlipidemia (CMS-HCC)    Hyperglycemia    Lower abdominal pain          Current Outpatient Medications   Medication Sig Dispense Refill    alendronate (Fosamax) 70 mg tablet Take 1 tablet (70 mg) by mouth every 7 (seven) days. Take in morning with full glass of water on an empty stomach. No food, drink, or meds for 30 minutes after. Do not lie down until after first meal of day (to avoid throat irritation). 12 tablet 3    calcium citrate-vitamin D3 (Citracal + D Maximum) 315 mg-6.25 mcg (250 unit) tablet Take 1  tablet by mouth once daily.      estradiol (Estrace) 0.01 % (0.1 mg/gram) vaginal cream Insert 2 g into the vagina at bedtime. At bedtime for 2 weeks, then at bedtime twice a week. 34 g 3    multivitamin with minerals tablet Take 1 tablet by mouth once daily.      omeprazole (PriLOSEC) 20 mg DR capsule TAKE 1 CAPSULE (20 MG) BY MOUTH IF NEEDED EACH DAY (DYSPEPSIA). 90 capsule 1     No current facility-administered medications for this visit.          Abdomen: soft, non tender with no referred or palpational pain     Pelvic examination:    Vulva: Normal appearance, normal hair distribution, no lesions or masses.     Urethra: Normal, no masses, non-tender, no discharge.     Vagina: atrophic  Cervix: N status post surgery  Uterus:  N  Adnexa: Normal, no masses, mobile, nontender.     Rectum: EXTERNAL HEMORRHOIDS.      Assessment/Plan      Pelvic pain  -     CT ABDOMEN PELVIS W AND WO CONTRAST; Future  Generalized abdominal pain  -     CT ABDOMEN PELVIS W AND WO CONTRAST; Future  Bloating  -     CT ABDOMEN PELVIS W AND WO CONTRAST; Future       Orders Placed This Encounter   Procedures    CT ABDOMEN PELVIS W AND WO CONTRAST     Standing Status:   Future     Standing Expiration Date:   11/28/2023     Order Specific Question:   Is IV hydration required for this patient?     Answer:   No     Order Specific Question:   Does the patient have renal disease?     Answer:   No     Order Specific Question:   Is this a cystogram?     Answer:   No     Order Specific Question:   Is this CT for painless hematuria?     Answer:   No     Order Specific Question:   Which facilty should receive the order?     Answer:   [facility]     Order Specific Question:   Release to patient     Answer:   Immediate [1]                 Patient was advised to check with her insurance company to sure that all tests ordered today are covered by her insurance company before heading for the tests.    Instruction:     Advise void daily and  gluten  CT abdomen and pelvis  Advise make an appointment with primary care doctor and her Gi and a back doctor if all work up ere negative  Advice to return to office after CT   Advice to return to office as needed if she has any problems if desires treatment/consult/visit

## 2022-11-29 NOTE — Patient Instructions (Signed)
Spoke with patient's dtr Sonya.  Reviewed the addendum pelvic US results.  Patient continues to have lower abdominal discomfort and bloating, will have CT abd pel on 12/16 and see GI in feb.  Await CT results.  Advised ER if in severe pain.

## 2022-12-06 NOTE — Telephone Encounter (Signed)
The patient is at [facility] to get labs done before the CT scan appointment on 12/16. Please place lab orders. Please contact the patient after lab orders are placed. Please advise       Call back number: [phone]

## 2022-12-06 NOTE — Telephone Encounter (Signed)
NO lab orders in chart.     Forward to PCP for review and advise message for pt.

## 2022-12-06 NOTE — Telephone Encounter (Signed)
CT orders were from oBGYN but I ordered bmp for her just now.

## 2022-12-06 NOTE — Telephone Encounter (Signed)
Calling patient at [phone]    Called and spoke to pt.   Relayed info. Pt understood and has no questions.     No further actions.   All topics addressed.   Signing note.

## 2022-12-09 LAB — BASIC METABOLIC PANEL
Anion Gap: 16 mmol/L (ref 10.0–18.0)
BUN/Creat Ratio: 25 (ref 12–28)
BUN: 17 mg/dL (ref 8–27)
Calcium: 9.9 mg/dL (ref 8.7–10.3)
Carbon Dioxide: 25 mmol/L (ref 20–29)
Chloride: 99 mmol/L (ref 96–106)
Creat: 0.69 mg/dL (ref 0.57–1.00)
Glucose: 99 mg/dL (ref 70–99)
Potassium: 4.6 mmol/L (ref 3.5–5.2)
Sodium: 140 mmol/L (ref 134–144)
eGFR: 94 mL/min/{1.73_m2} (ref >59)

## 2022-12-20 NOTE — Telephone Encounter (Signed)
Last OV with PCP: 11/23/2022    Next OV with PCP: 10/31/2023    Last HGBA1C:   HgbA1C (%)   Date Value   10/24/2022 5.2       Last BP:   BP Readings from Last 3 Encounters:   11/28/22 (!) 156/76   11/23/22 (!) 140/84   11/16/22 135/78       TSH:   TSH   Date Value Ref Range Status   10/24/2022 0.975 0.450 - 4.500 uIU/mL Final     Comment:     No apparent thyroid disorder. Additional testing not indicated. In  rare instances, Secondary Hypothyroidism as well as Subclinical  Hypothyroidism have been reported in some patients with normal TSH  values.         Last TOX screen (if applicable):    Labs    Lab Results   Component Value Date    CALCIUM 9.9 12/08/2022    ALBUMIN 4.2 10/24/2022    NA 140 12/08/2022    K 4.6 12/08/2022    CO2 25 12/08/2022    CL 99 12/08/2022    BUN 17 12/08/2022    CREATININE 0.69 12/08/2022      AST   Date Value Ref Range Status   10/24/2022 26 0 - 40 IU/L Final   05/24/2022 22 6 - 42 U/L Final   05/02/2022 29 0 - 40 IU/L Final   02/07/2022 22 6 - 42 U/L Final     ALT   Date Value Ref Range Status   10/24/2022 23 0 - 32 IU/L Final   05/24/2022 30 0 - 55 U/L Final   05/02/2022 29 0 - 32 IU/L Final   02/07/2022 40 0 - 55 U/L Final     No components found for: "TBILI"  Hemoglobin   Date Value Ref Range Status   08/01/2022 11.7 11.0 - 16.0 g/dL Final     Hgb   Date Value Ref Range Status   10/24/2022 13.8 11.1 - 15.9 g/dL Final     Hematocrit   Date Value Ref Range Status   08/01/2022 35.7 32.0 - 47.0 % Final     Hct   Date Value Ref Range Status   10/24/2022 42.4 34.0 - 46.6 % Final     Platelets   Date Value Ref Range Status   10/24/2022 207 150 - 450 x10E3/uL Final   08/01/2022 169 150 - 400 K/uL Final     No results found for: "PTT"  INR   Date Value Ref Range Status   02/07/2022 0.95 See Interpretive Comment Final     Comment:     RECOMMENDED INR WITH WARFARIN/COUMADIN THERAPY:                                     INR: 2.00-3.00   Deep vein thrombosis           Atrial Fibrillation   Tissue  Prosthetic Valve   Stroke Prevention    INR:2.50-3.50   Mechanical Prosthetic Valve         and Recurrent Systemic Embolism       No results found for: "PROTIME"  No results found for: "PTADJUSTED"

## 2022-12-25 ENCOUNTER — Inpatient Hospital Stay: Admit: 2022-12-25 | Payer: MEDICARE | Primary: Family Medicine

## 2022-12-25 DIAGNOSIS — R102 Pelvic and perineal pain: Secondary | ICD-10-CM

## 2022-12-25 MED ORDER — iohexol (OMNIPaque) 350 mg iodine/mL solution 100 mL
350 | Freq: Once | INTRAVENOUS | Status: AC
Start: 2022-12-25 — End: 2022-12-25
  Administered 2022-12-25: 20:00:00 100 mL via INTRAVENOUS

## 2022-12-25 MED ORDER — barium sulfate (Readi-Cat 2) 2 % (w/v) suspension 900 mL
2 | Freq: Once | ORAL | Status: AC
Start: 2022-12-25 — End: 2022-12-25
  Administered 2022-12-25: 20:00:00 900 mL via ORAL

## 2023-01-01 ENCOUNTER — Encounter: Payer: MEDICARE | Attending: Obstetrics & Gynecology | Primary: Family Medicine

## 2023-01-15 ENCOUNTER — Encounter: Payer: MEDICARE | Attending: Obstetrics & Gynecology | Primary: Family Medicine

## 2023-01-16 ENCOUNTER — Ambulatory Visit: Payer: MEDICARE | Primary: Family Medicine

## 2023-01-18 ENCOUNTER — Ambulatory Visit
Admit: 2023-01-18 | Discharge: 2023-01-18 | Payer: MEDICARE | Attending: Obstetrics & Gynecology | Primary: Family Medicine

## 2023-01-18 DIAGNOSIS — K5900 Constipation, unspecified: Secondary | ICD-10-CM

## 2023-01-18 NOTE — Progress Notes (Signed)
NAME:[name]   DOB:10-May-1955   ZOX:[id number]    DATE OF VISIT: 01/18/2023    Chaperone offered - declined  Primary care doctor:    Problem: visit  CHIEF COMPLAINT:Follow up CT abd/pelvis w/contrast 12/25/2022.   Narrative & Impression   Examination: CT Abdomen and pelvis with IV contrast     Indication: Bloating and pelvic pain      Technique: Dose reduction iterative reconstruction technique was utilized during this examination. Axial images were obtained with supplemental coronal and sagittal reconstructions. Examination was performed with intravenous contrast administration.     COMPARISON: 05/24/2022 CT     Findings:   LUNG BASES: Dependent atelectasis at the lung bases.  VISUALIZED HEART: Included cardiac chambers are normal in size without pericardial effusion or thickening.     LIVER: There is mild hepatic steatosis.  GALLBLADDER: Normal  BILIARY: No biliary dilatation is identified.  SPLEEN: Homogeneous-appearing normal size spleen. A small splenule is identified at the hilum.  PANCREAS: The pancreas is normal in appearance without ductal dilatation, mass or inflammation.  ADRENALS: The bilateral adrenal glands are normal in size, shape and morphology.  KIDNEYS: The kidneys are symmetric in size and configuration.     STOMACH: The stomach is nondistended in appearance. No mural nor rugal fold thickening is identified.  BOWEL: The duodenal bulb, sweep and ligament of Treitz position are anatomically situated. There is normal small bowel rotation. No bowel obstruction is identified. No inflammatory changes are noted. The distal and terminal ileum are unremarkable. Moderate to large volume of retained stool is seen within the colon from cecum through the splenic flexure. No large bowel obstruction or inflammation is seen. There is redundancy of the transverse colon. There is colonic diverticulosis along the descending and sigmoid colon with circular muscle hypertrophy of the sigmoid.  APPENDIX: The appendix is  not visualized but no pericecal inflammation is seen.     AORTA: Moderate abdominal aortic calcification atherosclerosis is noted. No aneurysm is identified.   VEINS: Patent splenic and portal veins.  LYMPH NODES: No adenopathy by CT size criteria.     BLADDER: Unremarkable appearance of the bladder for distention. No focal mural thickening, diverticulum or calculus is noted.  REPRODUCTIVE ORGANS: Hysterectomy. No adnexal mass.  PELVIS: No free air nor free fluid. No pelvic adenopathy or mass.  MUSCULOSKELETAL: No acute nor suspicious osseous abnormalities. There is degenerative disc disease at L1-2.  BODY WALL: Soft tissues are unremarkable in appearance. No soft tissue mass or fluid collections are identified.     IMPRESSION:     1. Large volume of retained stool is seen within the redundant appearing colon. No acute bowel obstruction or inflammation is seen. There is colonic diverticulosis along the descending and sigmoid colon.  2. Status post hysterectomy. No adnexal mass. No free fluid or free air.  3. No intra-abdominal mass or adenopathy.                    **  This medical report is generated using voice recognition technology and occasional "sound-a-like" words may be substituted. Please read the report carefully in the appropriate medical context. If there is a specific question regarding the language of the report, a radiologist can be made available for clarification. Tollie Eth, MD 12/25/2022 3:16 PM     I reviewed it and probably due to constipation    She Stop estrogen cream       CT was done for bloating  and blood in stool  Next GI appointment on 03/08/2023 with Dr Madelaine Etienne, MD     Advise Benefiber 2 tsp TID+colace 1 tab bid+add 30cc MOM if no BM 2 days  She had colonoscopy 2 yrs ago   Next in 03/2023  Advise walking daily 20-30'     Advise up primary care doctor - has an appointment on 03/08/2023 with primary care doctor     This is a new gyn problem    Other c/o: none  LMP:No LMP  recorded (lmp unknown). Patient is postmenopausal.          Last PE with pcp:     last urine with pcp:      Review of Systems   Constitutional: Negative.    HENT: Negative.     Eyes: Negative.    Respiratory: Negative.     Cardiovascular: Negative.    Gastrointestinal: Negative.    Endocrine: Negative.    Genitourinary: Negative.    Musculoskeletal: Negative.    Skin: Negative.    Allergic/Immunologic: Negative.    Neurological: Negative.       OBJECTIVE VS  BP (!) 153/83   Temp 36.5 C (97.7 F)   Wt 58.6 kg   LMP  (LMP Unknown)   BMI 27.00 kg/m    Advise primary care doctor     Patient Active Problem List   Diagnosis    Osteoporosis    Osteoarthritis    Benign neoplasm of colon, unspecified    Elevated blood pressure reading without diagnosis of hypertension    Vision disorder    Functional dyspepsia    Vitamin D deficiency    Chronic tension headache    Allergic rhinitis    RUQ pain    Gastroesophageal reflux disease    Preventative health care    S/P vaginal hysterectomy    Mixed hyperlipidemia    Hyperglycemia    Lower abdominal pain        Current Outpatient Medications   Medication Sig Dispense Refill    alendronate (Fosamax) 70 mg tablet Take 1 tablet (70 mg) by mouth every 7 (seven) days. Take in morning with full glass of water on an empty stomach. No food, drink, or meds for 30 minutes after. Do not lie down until after first meal of day (to avoid throat irritation). 12 tablet 3    calcium citrate-vitamin D3 (Citracal + D Maximum) 315 mg-6.25 mcg (250 unit) tablet Take 1 tablet by mouth once daily.      estradiol (Estrace) 0.01 % (0.1 mg/gram) vaginal cream Insert 2 g into the vagina at bedtime. At bedtime for 2 weeks, then at bedtime twice a week. 34 g 3    multivitamin with minerals tablet Take 1 tablet by mouth once daily.      omeprazole (PriLOSEC) 20 mg DR capsule TAKE 1 CAPSULE (20 MG) BY MOUTH IF NEEDED EACH DAY (DYSPEPSIA). 90 capsule 1     No current facility-administered medications for  this visit.          Physical Exam      Abdomen: soft, non tender with no referred or palpational pain     Pelvic examination:    Vulva: Normal appearance, normal hair distribution, no lesions or masses.     Urethra: Normal, no masses, non-tender, no discharge.     Vagina: Normal, rugated, physiologic discharge, no lesions, no masses, adequate pelvic support.     Cervix: N  Uterus: N    Adnexa:  Normal, no masses, mobile, nontender.     Rectum: EXTERNAL HEMORRHOIDS.        Assessment/Plan      Diagnoses and all orders for this visit:  Constipation, unspecified constipation type    Relief measure discussed - will follow up with primary care doctor and GI doctor                 Patient was advised to check with her insurance company to sure that all tests ordered today are covered by her insurance company before heading for the tests.    She is advised to make an one year annual gyn examination before she leaves office today if she does not have one schedule        Instruction:     Keep annual gynecological examination on 03/27/2023

## 2023-01-26 NOTE — Telephone Encounter (Signed)
Appointment for: [name] ([id number])   Visit type: FOLLOW UP (1002)   02/02/2023 1:40 PM (20 minutes) with Dr. Pat Kocher, MD in [facility] [address]      Patient comments:   High blood pressure 198

## 2023-01-26 NOTE — Telephone Encounter (Addendum)
Daughter is calling, Wed. BP 198, yesterday was feeing better, today went to an ER in [place]- [facility] in [place]- today BP 168 in ER.  The ER did not prescribe any BP meds and told patient to f/u with PCP.      Patient is staying with brother in [place] for the winter.  Daughter says she can pick her up and take her to an appt here.  Appt. Scheduled for MOnday with Sam.      She will either message or call us back to tell us which appt is best for patient.    Records request sent to [facility].

## 2023-01-29 ENCOUNTER — Ambulatory Visit: Admit: 2023-01-29 | Discharge: 2023-01-29 | Payer: MEDICARE | Attending: Family | Primary: Family Medicine

## 2023-01-29 DIAGNOSIS — I10 Essential (primary) hypertension: Secondary | ICD-10-CM

## 2023-01-29 MED ORDER — losartan (Cozaar) 25 mg tablet
25 | ORAL_TABLET | Freq: Every day | ORAL | 1 refills | 90.00000 days | Status: DC
Start: 2023-01-29 — End: 2023-02-20

## 2023-01-29 NOTE — Addendum Note (Signed)
Addended by: Colvin Caroli on: 01/29/2023 11:14 AM     Modules accepted: Orders, Level of Service

## 2023-01-29 NOTE — Progress Notes (Addendum)
[facility]  [facility] [place]  [address]  [place] [address]  Dept: [phone]  Dept Fax: [phone]     Patient ID: [name] is a 68 y.o. female who presents for ER follow up     Subjective   Patient presents today for an ER follow-up.  Patient was seen at ER in [facility] in [place] for hypertension.  She presents with her daughter [name] (HCP)   She lives with her husband in [place]'s home [place]     Blood pressures at home are  170s-190s SBP  BP at hospitals 170s  Blood pressure in office is  BP Readings from Last 5 Encounters:  01/29/23 : (!) 148/82  01/18/23 : (!) 153/83  11/28/22 : (!) 156/76  11/23/22 : (!) 140/84  11/16/22 : 135/78    She reports occipital headache, dizziness and nausea prior to being seen in the ER    She drinks etoh occasionally  Does not smoke  She does not exercise     Wt Readings from Last 5 Encounters:  01/29/23 : 58.5 kg  01/18/23 : 58.6 kg  11/28/22 : 58.1 kg  11/23/22 : 57.6 kg  11/16/22 : 57.6 kg          Patient Active Problem List   Diagnosis    Osteoporosis    Osteoarthritis    Benign neoplasm of colon, unspecified    Vision disorder    Functional dyspepsia    Vitamin D deficiency    Chronic tension headache    Allergic rhinitis    RUQ pain    Gastroesophageal reflux disease    S/P vaginal hysterectomy    Mixed hyperlipidemia    Hyperglycemia    Lower abdominal pain    Primary hypertension    Slow transit constipation     Current Outpatient Medications   Medication Instructions    alendronate (FOSAMAX) 70 mg, oral, Every 7 days, Take in morning with full glass of water on an empty stomach. No food, drink, or meds for 30 minutes after. Do not lie down until after first meal of day (to avoid throat irritation).    calcium citrate-vitamin D3 (Citracal + D Maximum) 315 mg-6.25 mcg (250 unit) tablet 1 tablet, oral, Daily    losartan (COZAAR) 25 mg, oral,  Daily    multivitamin with minerals tablet 1 tablet, oral, Daily    omeprazole (PRILOSEC) 20 mg, oral, Daily PRN     Allergies   Allergen Reactions    Influenza Vaccine Tr-S 11 (Pf) Swelling    Penicillins Hives and Rash    Amitriptyline Palpitations    Boostrix Tdap [Diphth,Pertus(Acell),Tetanus] Rash    Flu Virus Vaccine Tv 2015-16 (18 Yr And Up),Recomb Rash    Sulfa (Sulfonamide Antibiotics) Rash     Past Medical History:   Diagnosis Date    Chronic tension headaches     Colon polyp     Dyspepsia     GERD (gastroesophageal reflux disease)     Hx of adenomatous polyp of colon     Mood disorder     Osteoarthritis     Osteoporosis     Prolapse of female bladder, acquired 06/2022    Retina disorder     bilateral,    gets injections monthly    Vitamin D deficiency      Past Surgical History:   Procedure Laterality Date  COLONOSCOPY      TOTAL VAGINAL HYSTERECTOMY       No family history on file.  Social History     Socioeconomic History    Marital status: Married     Spouse name: Not on file    Number of children: Not on file    Years of education: Not on file    Highest education level: Not on file   Occupational History    Not on file   Tobacco Use    Smoking status: Never     Passive exposure: Never    Smokeless tobacco: Never   Vaping Use    Vaping status: Never Used   Substance and Sexual Activity    Alcohol use: Not Currently     Comment: rare    Drug use: Never    Sexual activity: Defer   Other Topics Concern    Not on file   Social History Narrative    Not on file     Social Determinants of Health     Financial Resource Strain: Low Risk  (10/24/2022)    Overall Financial Resource Strain (CARDIA)     Difficulty of Paying Living Expenses: Not very hard   Food Insecurity: Unknown (10/24/2022)    Hunger Vital Sign     Worried About Running Out of Food in the Last Year: Never true     Ran Out of Food in the Last Year: Not on file   Transportation Needs: No Transportation Needs (10/24/2022)    PRAPARE -  Therapist, art (Medical): No     Lack of Transportation (Non-Medical): No   Physical Activity: Sufficiently Active (10/24/2022)    Exercise Vital Sign     Days of Exercise per Week: 5 days     Minutes of Exercise per Session: 30 min   Stress: No Stress Concern Present (10/24/2022)    [employer] of Occupational Health - Occupational Stress Questionnaire     Feeling of Stress : Not at all   Social Connections: Unknown (10/24/2022)    Social Connection and Isolation Panel [NHANES]     Frequency of Communication with Friends and Family: Never     Frequency of Social Gatherings with Friends and Family: Not on file     Attends Religious Services: Not on file     Active Member of Clubs or Organizations: No     Attends Banker Meetings: Not on file     Marital Status: Not on file   Intimate Partner Violence: Not on file   Housing Stability: Unknown (10/24/2022)    Housing Stability Vital Sign     Unable to Pay for Housing in the Last Year: Not on file     Number of Places Lived in the Last Year: Not on file     Unstable Housing in the Last Year: No     Medications Discontinued During This Encounter   Medication Reason    estradiol (Estrace) 0.01 % (0.1 mg/gram) vaginal cream Therapy completed     Outpatient Medications Prior to Visit   Medication Sig Dispense Refill    alendronate (Fosamax) 70 mg tablet Take 1 tablet (70 mg) by mouth every 7 (seven) days. Take in morning with full glass of water on an empty stomach. No food, drink, or meds for 30 minutes after. Do not lie down until after first meal of day (to avoid throat irritation). 12 tablet 3    calcium citrate-vitamin D3 (  Citracal + D Maximum) 315 mg-6.25 mcg (250 unit) tablet Take 1 tablet by mouth once daily.      multivitamin with minerals tablet Take 1 tablet by mouth once daily.      omeprazole (PriLOSEC) 20 mg DR capsule TAKE 1 CAPSULE (20 MG) BY MOUTH IF NEEDED EACH DAY (DYSPEPSIA). 90 capsule 1    estradiol  (Estrace) 0.01 % (0.1 mg/gram) vaginal cream Insert 2 g into the vagina at bedtime. At bedtime for 2 weeks, then at bedtime twice a week. 34 g 3       Objective   Visit Vitals  BP (!) 148/82 (BP Location: Left arm, Patient Position: Sitting)   Pulse 76   Temp 36.6 C (97.8 F) (Temporal)   Ht 1.473 m   Wt 58.5 kg   LMP  (LMP Unknown)   SpO2 99%   BMI 26.96 kg/m   OB Status Postmenopausal   BSA 1.55 m       Physical Exam  Vitals reviewed.   Constitutional:       Appearance: Normal appearance.   Neurological:      Mental Status: She is alert.   Psychiatric:         Mood and Affect: Mood normal.         Behavior: Behavior normal.         Thought Content: Thought content normal.         Judgment: Judgment normal.         Assessment/Plan   [name] was seen today for hypertension.  Primary hypertension  Assessment & Plan:  Presents with htn and headache and dizziness which prompted her to go to the ER   BP Readings from Last 5 Encounters:   01/29/23 (!) 148/82   01/18/23 (!) 153/83   11/28/22 (!) 156/76   11/23/22 (!) 140/84   11/16/22 135/78       Headache has improved but now reports some numbness to occiput   Plan to start Losartan  Increase exercise.  She had recent prolapse surgery and she has difficulty exercising.  Encouraged recumbant bike or walking around mall   Start Losartan 25mg      F/u 4 weeks     Your blood pressure goal is <140 and <80. To help achieve that goal please follow the Dash diet- The Dietary Approaches to Stop Hypertension diet is high in vegetable, fruits, low fat dairy products, whole grains, poultry, fish and nuts. The diet is low in sweets, sugar sweetened beverages and red meats. The DASH diet is rich in potassium, magnesium, calcium, protein and fiber but, low in saturated fat and cholesterol. Eat foods naturally high in potassium and magnesium unless otherwise advised differently.    Eat a low sodium diet, less than 2.3 grams of salt daily.  The American Heart Association recommends 150  minutes of exercise per week.  Call the office if you develop shortness of breath, chest pressure or dizziness while exercising.  Losing weight can benefit your blood pressure.  Your blood pressure can decrease by 1mm for every pound you lose.  Limit your alcohol intake     Take your blood pressure at home three times per week at different times of day and create a log.  Bring your log and blood pressure cuff with you to your next visit.    Orders:  -     losartan (Cozaar) 25 mg tablet; Take 1 tablet (25 mg) by mouth once daily.  Slow transit constipation  Assessment & Plan:  Reports constipation   Upcoming appt with Dr. Erline Levine 2/27  Dr. Dorna Bloom recommending MOM, fiber supplement, colace which caused abdominal pain     Constipation Plan:  Daily exercise  2-3L of water per day  Fiber supplement (Benefiber, or metamucil wafers)  If needing more, 1-2 caps of Miralax daily   If not BM in 2-3 days take 2 sennas at night (not to exceed once weekly)  Squatty potty

## 2023-01-29 NOTE — Assessment & Plan Note (Signed)
>>  ASSESSMENT AND PLAN FOR SLOW TRANSIT CONSTIPATION WRITTEN ON 01/29/2023 11:12 AM BY LUCIE HAGEDORN, NP    Reports constipation   Upcoming appt with Dr. Virgilio 2/27  Dr. Alena recommending MOM, fiber supplement, colace which caused abdominal pain     Constipation Plan:  Daily exercise  2-3L of water per day  Fiber supplement (Benefiber, or metamucil wafers)  If needing more, 1-2 caps of Miralax daily   If not BM in 2-3 days take 2 sennas at night (not to exceed once weekly)  Squatty potty

## 2023-01-29 NOTE — Patient Instructions (Addendum)
Your blood pressure goal is <140 and <80. To help achieve that goal please follow the Dash diet- The Dietary Approaches to Stop Hypertension diet is high in vegetable, fruits, low fat dairy products, whole grains, poultry, fish and nuts. The diet is low in sweets, sugar sweetened beverages and red meats. The DASH diet is rich in potassium, magnesium, calcium, protein and fiber but, low in saturated fat and cholesterol. Eat foods naturally high in potassium and magnesium unless otherwise advised differently.    Eat a low sodium diet, less than 2.3 grams of salt daily.  The American Heart Association recommends 150 minutes of exercise per week.  Call the office if you develop shortness of breath, chest pressure or dizziness while exercising.  Losing weight can benefit your blood pressure.  Your blood pressure can decrease by 1mm for every pound you lose.  Limit your alcohol intake     Take your blood pressure at home three times per week at different times of day and create a log.  Bring your log and blood pressure cuff with you to your next visit.    Constipation Plan:  Daily exercise  2-3L of water per day  Fiber supplement (Benefiber, or metamucil wafers)  If needing more, 1-2 caps of Miralax daily   If not BM in 2-3 days take 2 sennas at night (not to exceed once weekly)

## 2023-01-29 NOTE — Assessment & Plan Note (Addendum)
Presents with htn and headache and dizziness which prompted her to go to the ER   BP Readings from Last 5 Encounters:   01/29/23 (!) 148/82   01/18/23 (!) 153/83   11/28/22 (!) 156/76   11/23/22 (!) 140/84   11/16/22 135/78       Headache has improved but now reports some numbness to occiput   Plan to start Losartan  Increase exercise.  She had recent prolapse surgery and she has difficulty exercising.  Encouraged recumbant bike or walking around mall   Start Losartan 25mg      F/u 4 weeks     Your blood pressure goal is <140 and <80. To help achieve that goal please follow the Dash diet- The Dietary Approaches to Stop Hypertension diet is high in vegetable, fruits, low fat dairy products, whole grains, poultry, fish and nuts. The diet is low in sweets, sugar sweetened beverages and red meats. The DASH diet is rich in potassium, magnesium, calcium, protein and fiber but, low in saturated fat and cholesterol. Eat foods naturally high in potassium and magnesium unless otherwise advised differently.    Eat a low sodium diet, less than 2.3 grams of salt daily.  The American Heart Association recommends 150 minutes of exercise per week.  Call the office if you develop shortness of breath, chest pressure or dizziness while exercising.  Losing weight can benefit your blood pressure.  Your blood pressure can decrease by 1mm for every pound you lose.  Limit your alcohol intake     Take your blood pressure at home three times per week at different times of day and create a log.  Bring your log and blood pressure cuff with you to your next visit.

## 2023-01-29 NOTE — Assessment & Plan Note (Addendum)
Reports constipation   Upcoming appt with Dr. Erline Levine 2/27  Dr. Dorna Bloom recommending MOM, fiber supplement, colace which caused abdominal pain     Constipation Plan:  Daily exercise  2-3L of water per day  Fiber supplement (Benefiber, or metamucil wafers)  If needing more, 1-2 caps of Miralax daily   If not BM in 2-3 days take 2 sennas at night (not to exceed once weekly)  Squatty potty

## 2023-02-02 ENCOUNTER — Encounter: Payer: MEDICARE | Attending: Family Medicine | Primary: Family Medicine

## 2023-02-20 NOTE — Telephone Encounter (Signed)
REQUESTING 90 DAY SUPPLY    Last OV with PCP: 01/29/2023-SAM    Next OV with PCP: 03/08/23-JHA    Last HGBA1C:   HgbA1C (%)   Date Value   10/24/2022 5.2       Last BP:   BP Readings from Last 3 Encounters:   01/29/23 (!) 148/82   01/18/23 (!) 153/83   11/28/22 (!) 156/76       TSH:   TSH   Date Value Ref Range Status   10/24/2022 0.975 0.450 - 4.500 uIU/mL Final     Comment:     No apparent thyroid disorder. Additional testing not indicated. In  rare instances, Secondary Hypothyroidism as well as Subclinical  Hypothyroidism have been reported in some patients with normal TSH  values.         Last TOX screen (if applicable):    Labs    Lab Results   Component Value Date    CALCIUM 9.9 12/08/2022    ALBUMIN 4.2 10/24/2022    NA 140 12/08/2022    K 4.6 12/08/2022    CO2 25 12/08/2022    CL 99 12/08/2022    BUN 17 12/08/2022    CREATININE 0.69 12/08/2022      AST   Date Value Ref Range Status   10/24/2022 26 0 - 40 IU/L Final   05/24/2022 22 6 - 42 U/L Final   05/02/2022 29 0 - 40 IU/L Final   02/07/2022 22 6 - 42 U/L Final     ALT   Date Value Ref Range Status   10/24/2022 23 0 - 32 IU/L Final   05/24/2022 30 0 - 55 U/L Final   05/02/2022 29 0 - 32 IU/L Final   02/07/2022 40 0 - 55 U/L Final     No components found for: "TBILI"  Hemoglobin   Date Value Ref Range Status   08/01/2022 11.7 11.0 - 16.0 g/dL Final     Hgb   Date Value Ref Range Status   10/24/2022 13.8 11.1 - 15.9 g/dL Final     Hematocrit   Date Value Ref Range Status   08/01/2022 35.7 32.0 - 47.0 % Final     Hct   Date Value Ref Range Status   10/24/2022 42.4 34.0 - 46.6 % Final     Platelets   Date Value Ref Range Status   10/24/2022 207 150 - 450 x10E3/uL Final   08/01/2022 169 150 - 400 K/uL Final     No results found for: "PTT"  INR   Date Value Ref Range Status   02/07/2022 0.95 See Interpretive Comment Final     Comment:     RECOMMENDED INR WITH WARFARIN/COUMADIN THERAPY:                                     INR: 2.00-3.00   Deep vein thrombosis            Atrial Fibrillation   Tissue Prosthetic Valve   Stroke Prevention    INR:2.50-3.50   Mechanical Prosthetic Valve         and Recurrent Systemic Embolism       No results found for: "PROTIME"  No results found for: "PTADJUSTED"

## 2023-03-07 ENCOUNTER — Inpatient Hospital Stay: Admit: 2023-03-07 | Payer: MEDICARE | Primary: Family Medicine

## 2023-03-07 DIAGNOSIS — Z1231 Encounter for screening mammogram for malignant neoplasm of breast: Secondary | ICD-10-CM

## 2023-03-08 ENCOUNTER — Ambulatory Visit: Admit: 2023-03-08 | Discharge: 2023-03-08 | Payer: MEDICARE | Attending: Internal Medicine | Primary: Family Medicine

## 2023-03-08 ENCOUNTER — Ambulatory Visit: Admit: 2023-03-08 | Discharge: 2023-03-08 | Payer: MEDICARE | Attending: Family Medicine | Primary: Family Medicine

## 2023-03-08 DIAGNOSIS — K5904 Chronic idiopathic constipation: Secondary | ICD-10-CM

## 2023-03-08 DIAGNOSIS — I10 Essential (primary) hypertension: Secondary | ICD-10-CM

## 2023-03-08 MED ORDER — losartan (Cozaar) 25 mg tablet
25 | ORAL_TABLET | Freq: Every day | ORAL | 3 refills | 90.00000 days | Status: AC
Start: 2023-03-08 — End: ?

## 2023-03-08 MED ORDER — linaCLOtide (Linzess) 72 mcg capsule
72 | ORAL_CAPSULE | Freq: Every day | ORAL | 11 refills | Status: DC
Start: 2023-03-08 — End: 2023-04-03

## 2023-03-08 NOTE — Progress Notes (Signed)
 [facility] - Clinic Note  Patient name: [name]  DOB: Jan 17, 1955  PCP: Pat Kocher, MD    CC:  New referral - abd pain, gas    HPI:    August 2024 - bladder prolapse, ?rectocele    But having epigastric + lower abd pains -  seen PCP and gynecology  Had CT scan show a lot of stool  Began after surgery  Dr has started fiber and miralax daily, stool softener   -metamucil daily  -1-2 capful miralax daily  -1 colace daily   PAIN HAS RESOLVED since starting bowel regimen    Denies heartburn, regurgitation, abdominal distension, dysphagia, odynophagia, n/v, hematemesis, diarrhea, hematochezia, melena, unintentional wt loss.    GI Hx/Data Reviewed:  CTAP 12/24:  1. Large volume of retained stool is seen within the redundant appearing colon. No acute bowel obstruction or inflammation is seen. There is colonic diverticulosis along the descending and sigmoid colon.  2. Status post hysterectomy. No adnexal mass. No free fluid or free air.  3. No intra-abdominal mass or adenopathy.    EGD 7/14: LA A esophagitis, gastritis, bx neg for BE/HP/celiac    COL 1/23: tics, - 5 yrs  COL 9/19: tics, 3 SSL, 1 TA  COL 8/14: nml TI, 1 TA, tics    Pt's problem list, PMHx, surgical hx, social hx, meds, allergies were reviewed.  Objective:   Vitals:   BP 138/82   Pulse 71   Wt 58.1 kg   LMP  (LMP Unknown)   BMI 26.75 kg/m   Wt Readings from Last 3 Encounters:   03/08/23 58.1 kg   01/29/23 58.5 kg   01/18/23 58.6 kg     Physical Exam:  Gen: well-developed, NAD, alert and oriented  Skin: no jaundice or rashes  HEENT: no scleral icterus, MMM  Abd: +BS, soft, NT, ND, no rebound/guarding  Psych: normal mood and affect    A/P:   Diagnoses and all orders for this visit:  Chronic idiopathic constipation  -     CBC; Future  -     TSH with reflex; Future  -     Calcium, total; Future  -     linaCLOtide (Linzess) 72 mcg capsule; Take 1 capsule (72 mcg) by mouth before breakfast. Do not crush or chew.    #Chronic  constipation  Recurrent epigastric and lower abd pain that has resolved since starting bowel regimen. Sx likely 2/2 constipation - suspect CIC or functional/adhesional dz given onset after recent GYN surgery. Will r/o secondary causes. Although sx are controlled with fiber, osmotic laxative, and stool softener, can trial secretagogue to minimize medication burden for pt. Up to date on colon cancer screening as below.    -CBC, TSH, Ca  -Take Linzess daily. After 2-4 weeks, call us to let us know how you are doing. We can adjust the dose if too weak or strong.  -Can stop metamucil, miralax, docusate once starts Linzess    #hx of colon polyps  COL 1/23: tics, - 5 yrs  -Next COL due 1/28    RTC in 4 mo

## 2023-03-08 NOTE — Assessment & Plan Note (Signed)
 Doing much better, headaches have resolved.  Home BP are wnl.  Current regimen: losartan 25mg   BMP ordered  Hypertension is improving with treatment.  Continue current treatment regimen.  Regular aerobic exercise.  Ambulatory blood pressure monitoring.  Blood pressure will be reassessed in 3 months    BP Readings from Last 3 Encounters:   03/08/23 126/72   03/08/23 138/82   01/29/23 (!) 148/82     .

## 2023-03-08 NOTE — Progress Notes (Signed)
 [facility]IC[facility]  [facility] [place]  [address]  [place] [address]  Dept: [phone]  Dept Fax: [phone]     Patient ID: [name] is a 68 y.o. female who presents for Follow-up (Bp check).  N/A was present who provided provided independent history during this visit.        Subjective   HPI      #HTN  Started on losartan 25mg  on 01/2023 by Horton Marshall, NP  Patient was seen at ER in [facility] in [place] for hypertension. She reports occipital headache, dizziness and nausea prior to being seen in the ER     Since starting losartan, she reports   Home machine read: 109/69  Home ranges: 118-120/60-70s    BP Readings from Last 3 Encounters:   03/08/23 126/72   03/08/23 138/82   01/29/23 (!) 148/82         Objective   Visit Vitals  BP 126/72 (BP Location: Left arm, Patient Position: Sitting, BP Cuff Size: Adult)   Pulse 84   Temp 36.4 C (97.5 F) (Temporal)   Ht 1.473 m   Wt 58.1 kg   LMP  (LMP Unknown)   SpO2 97%   BMI 26.75 kg/m   OB Status Postmenopausal   BSA 1.54 m       Physical Exam  Vitals reviewed.   Constitutional:       Appearance: Normal appearance.   HENT:      Head: Normocephalic.   Cardiovascular:      Rate and Rhythm: Normal rate and regular rhythm.      Heart sounds: No murmur heard.  Pulmonary:      Effort: Pulmonary effort is normal.      Breath sounds: Normal breath sounds. No wheezing.   Skin:     General: Skin is warm.   Neurological:      Mental Status: She is alert and oriented to person, place, and time.         Assessment/Plan   Problem List Items Addressed This Visit          Medium    Primary hypertension - Primary     Doing much better, headaches have resolved.  Home BP are wnl.  Current regimen: losartan 25mg   BMP ordered  Hypertension is improving with treatment.  Continue current treatment regimen.  Regular aerobic exercise.  Ambulatory blood pressure  monitoring.  Blood pressure will be reassessed in 3 months    BP Readings from Last 3 Encounters:   03/08/23 126/72   03/08/23 138/82   01/29/23 (!) 148/82     .         Relevant Medications    losartan (Cozaar) 25 mg tablet    Other Relevant Orders    Basic metabolic panel           Follow up in about 3 months (around 06/05/2023) for est care/HTN follow up with [facility]    Patient provided verbal consent to use of HIPAA compliant Voice transcribing service.  Electronically signed by: Pat Kocher, MD 03/08/2023

## 2023-03-08 NOTE — Patient Instructions (Signed)
 Blood work    Take Linzess daily. After 2-4 weeks, call us to let us know how you are doing. We can adjust the dose if too weak or strong.

## 2023-03-08 NOTE — Progress Notes (Signed)
 Veni completed.

## 2023-03-09 LAB — BASIC METABOLIC PANEL
Anion Gap: 16 mmol/L (ref 10.0–18.0)
BUN/Creat Ratio: 19 (ref 12–28)
BUN: 14 mg/dL (ref 8–27)
Calcium: 10.3 mg/dL (ref 8.7–10.3)
Carbon Dioxide: 25 mmol/L (ref 20–29)
Chloride: 101 mmol/L (ref 96–106)
Creat: 0.74 mg/dL (ref 0.57–1.00)
Glucose: 133 mg/dL — ABNORMAL HIGH (ref 70–99)
Potassium: 4.4 mmol/L (ref 3.5–5.2)
Sodium: 142 mmol/L (ref 134–144)
eGFR: 88 mL/min/{1.73_m2} (ref >59)

## 2023-03-09 NOTE — Telephone Encounter (Signed)
 Please place new order for BMD

## 2023-03-12 ENCOUNTER — Ambulatory Visit: Payer: MEDICARE | Primary: Family Medicine

## 2023-03-12 NOTE — Telephone Encounter (Signed)
 All set!

## 2023-03-20 ENCOUNTER — Ambulatory Visit: Payer: MEDICARE | Attending: Internal Medicine | Primary: Family Medicine

## 2023-03-27 ENCOUNTER — Encounter: Payer: MEDICARE | Attending: Obstetrics & Gynecology | Primary: Family Medicine

## 2023-03-27 NOTE — Telephone Encounter (Signed)
 Patient booked herself in My chart :   [facility] triage     Appointment for: [name] ([id number])  Visit type: FOLLOW UP (1002)  04/03/2023 2:30 PM (30 minutes) with Colvin Caroli, NP in [facility] IM [phone]    Patient comments:  Pain in liver

## 2023-03-27 NOTE — Telephone Encounter (Signed)
 Spoke to pt she has had pain RT side front discomfort where liver is for 1 week -has had this pain before  she ate something spicy that didn't agree with her last week also had a little alcohol diluted. she had some nausea and trouble going to the bathroom feels a bit better today  Had a BM after taking 2 dulcolax feels like she needs to go more to cleanse her system  Pt has stomach issues and on omeprazole

## 2023-04-03 ENCOUNTER — Inpatient Hospital Stay: Admit: 2023-04-03 | Payer: MEDICARE | Primary: Family Medicine

## 2023-04-03 ENCOUNTER — Ambulatory Visit: Admit: 2023-04-03 | Discharge: 2023-04-03 | Payer: MEDICARE | Attending: Family | Primary: Family Medicine

## 2023-04-03 DIAGNOSIS — M810 Age-related osteoporosis without current pathological fracture: Secondary | ICD-10-CM

## 2023-04-03 DIAGNOSIS — R1011 Right upper quadrant pain: Secondary | ICD-10-CM

## 2023-04-03 NOTE — Patient Instructions (Addendum)
 Ultrasound scheduled for Thurs 3/27  Nothing to eat or drink after midnight     Omeprazole daily 30 mins before breakfast

## 2023-04-03 NOTE — Assessment & Plan Note (Signed)
>>  ASSESSMENT AND PLAN FOR SLOW TRANSIT CONSTIPATION WRITTEN ON 04/03/2023  2:30 PM BY Antion Andres, NP    Controlled on probiotic and Miralax   Colonic stool burden noted on abdominal CT scan from 12/24   Followed by Satyam who recommended Linzess , patient did not start due to cost

## 2023-04-03 NOTE — Progress Notes (Signed)
 Lab in office

## 2023-04-03 NOTE — Assessment & Plan Note (Signed)
 Controlled on Losartan   BP Readings from Last 4 Encounters:   04/03/23 110/70   03/08/23 126/72   03/08/23 138/82   01/29/23 (!) 148/82

## 2023-04-03 NOTE — Assessment & Plan Note (Addendum)
 Patient presents with 1 month of RUQ pain   Symptoms began after she went to a buffet   History of chronic abdominal pain   Denies nausea, vomiting, weight loss, decreased appetite     On exam:  Soft, undistended, good BS, focally tender at mid right side of abdomen      F/u amylase, lipase, cbc, cmp,   [place] scheduled for thurs 3/27 at 9am  NPO after midnight   Omeprazole daily

## 2023-04-03 NOTE — Progress Notes (Signed)
 [facility] [place]  [address]  [place] [address]  Dept: [phone]  Dept Fax: [phone]     Patient ID: [name] is a 68 y.o. female who presents for "pain in liver"     Subjective   #RUQ pain   Presents with RUQ pain   History of chronic abdominal pain  Noted a couple months ago patient was at a buffet and drank alcohol.  She experienced nausea, vomiting, GERD and abdominal pain.  Improved with alka seltzer.    RUQ pain persists for the past month  Reports dull aching pain 9/10  Pepto Bismol is mildly effective  Denies vomiting, weight loss  She is having normal BMs, taking probiotics and miralax   Followed by [facility] for constipation, ordered linzess but patient is not using due to cost    CT abdomen 12/2022:  IMPRESSION:  1. Large volume of retained stool is seen within the redundant appearing colon. No acute bowel obstruction or inflammation is seen. There is colonic diverticulosis along the descending and sigmoid colon.  2. Status post hysterectomy. No adnexal mass. No free fluid or free air.  3. No intra-abdominal mass or adenopathy.    #HTN  BP Readings from Last 4 Encounters:  04/03/23 : 110/70  03/08/23 : 126/72  03/08/23 : 138/82  01/29/23 : (!) 148/82  She is on losartan 25mg        Patient Active Problem List   Diagnosis    Osteoporosis    Osteoarthritis    Benign neoplasm of colon, unspecified    Vision disorder    Functional dyspepsia    Vitamin D deficiency    Chronic tension headache    Allergic rhinitis    RUQ pain    Gastroesophageal reflux disease    S/P vaginal hysterectomy    Mixed hyperlipidemia    Hyperglycemia    Lower abdominal pain    Primary hypertension    Slow transit constipation     Current Outpatient Medications   Medication Instructions    alendronate (FOSAMAX) 70 mg, oral, Every 7 days, Take in morning with full glass of water on an empty stomach. No food, drink, or meds for 30 minutes after. Do not lie down until after  first meal of day (to avoid throat irritation).    calcium citrate-vitamin D3 (Citracal + D Maximum) 315 mg-6.25 mcg (250 unit) tablet 1 tablet, oral, Daily    losartan (COZAAR) 25 mg, oral, Daily    multivitamin with minerals tablet 1 tablet, oral, Daily    omeprazole (PRILOSEC) 20 mg, oral, Daily PRN     Allergies   Allergen Reactions    Influenza Vaccine Tr-S 11 (Pf) Swelling    Penicillins Hives and Rash    Amitriptyline Palpitations    Boostrix Tdap [Diphth,Pertus(Acell),Tetanus] Rash    Flu Virus Vaccine Tv 2015-16 (18 Yr And Up),Recomb Rash    Sulfa (Sulfonamide Antibiotics) Rash     Past Medical History:   Diagnosis Date    Chronic tension headaches     Colon polyp     Dyspepsia     GERD (gastroesophageal reflux disease)     Hx of adenomatous polyp of colon     Mood disorder     Osteoarthritis     Osteoporosis     Prolapse of female bladder, acquired 06/2022    Retina disorder     bilateral,    gets injections monthly    Vitamin D deficiency  Past Surgical History:   Procedure Laterality Date    COLONOSCOPY      TOTAL VAGINAL HYSTERECTOMY       Family History   Problem Relation Name Age of Onset    Breast cancer Neg Hx       Social History     Socioeconomic History    Marital status: Married     Spouse name: Not on file    Number of children: Not on file    Years of education: Not on file    Highest education level: Not on file   Occupational History    Not on file   Tobacco Use    Smoking status: Never     Passive exposure: Never    Smokeless tobacco: Never   Vaping Use    Vaping status: Never Used   Substance and Sexual Activity    Alcohol use: Not Currently     Comment: rare    Drug use: Never    Sexual activity: Defer   Other Topics Concern    Not on file   Social History Narrative    Not on file     Social Determinants of Health     Financial Resource Strain: Low Risk  (10/24/2022)    Overall Financial Resource Strain (CARDIA)     Difficulty of Paying Living Expenses: Not very hard   Food  Insecurity: Unknown (10/24/2022)    Hunger Vital Sign     Worried About Running Out of Food in the Last Year: Never true     Ran Out of Food in the Last Year: Not on file   Transportation Needs: No Transportation Needs (10/24/2022)    PRAPARE - Therapist, art (Medical): No     Lack of Transportation (Non-Medical): No   Physical Activity: Sufficiently Active (10/24/2022)    Exercise Vital Sign     Days of Exercise per Week: 5 days     Minutes of Exercise per Session: 30 min   Stress: No Stress Concern Present (10/24/2022)    [employer] of Occupational Health - Occupational Stress Questionnaire     Feeling of Stress : Not at all   Social Connections: Unknown (10/24/2022)    Social Connection and Isolation Panel [NHANES]     Frequency of Communication with Friends and Family: Never     Frequency of Social Gatherings with Friends and Family: Not on file     Attends Religious Services: Not on file     Active Member of Clubs or Organizations: No     Attends Banker Meetings: Not on file     Marital Status: Not on file   Intimate Partner Violence: Not on file   Housing Stability: Unknown (10/24/2022)    Housing Stability Vital Sign     Unable to Pay for Housing in the Last Year: Not on file     Number of Places Lived in the Last Year: Not on file     Unstable Housing in the Last Year: No     Medications Discontinued During This Encounter   Medication Reason    linaCLOtide (Linzess) 72 mcg capsule Therapy completed     Outpatient Medications Prior to Visit   Medication Sig Dispense Refill    alendronate (Fosamax) 70 mg tablet Take 1 tablet (70 mg) by mouth every 7 (seven) days. Take in morning with full glass of water on an empty stomach. No food, drink, or meds for 30  minutes after. Do not lie down until after first meal of day (to avoid throat irritation). 12 tablet 3    calcium citrate-vitamin D3 (Citracal + D Maximum) 315 mg-6.25 mcg (250 unit) tablet Take 1 tablet by mouth  once daily.      losartan (Cozaar) 25 mg tablet Take 1 tablet (25 mg) by mouth once daily. 90 tablet 3    multivitamin with minerals tablet Take 1 tablet by mouth once daily.      omeprazole (PriLOSEC) 20 mg DR capsule TAKE 1 CAPSULE (20 MG) BY MOUTH IF NEEDED EACH DAY (DYSPEPSIA). 90 capsule 1    linaCLOtide (Linzess) 72 mcg capsule Take 1 capsule (72 mcg) by mouth before breakfast. Do not crush or chew. 30 capsule 11       Objective   Visit Vitals  BP 110/70 (BP Location: Right arm, Patient Position: Sitting, BP Cuff Size: Adult)   Pulse 88   Temp 36.6 C (97.9 F) (Temporal)   Ht 1.473 m   Wt 58.5 kg   LMP  (LMP Unknown)   SpO2 98%   BMI 26.96 kg/m   OB Status Postmenopausal   BSA 1.55 m     Physical Exam  Abdominal:      General: Abdomen is flat. Bowel sounds are normal. There is no distension.      Palpations: Abdomen is soft.      Tenderness: There is abdominal tenderness in the right upper quadrant and epigastric area. There is no right CVA tenderness or left CVA tenderness.             Assessment/Plan   [name] was seen today for abdominal pain.  RUQ pain  Assessment & Plan:  Patient presents with 1 month of RUQ pain   Symptoms began after she went to a buffet   History of chronic abdominal pain   Denies nausea, vomiting, weight loss, decreased appetite     On exam:  Soft, undistended, good BS, focally tender at mid right side of abdomen      F/u amylase, lipase, cbc, cmp,   [place] scheduled for thurs 3/27 at 9am  NPO after midnight   Omeprazole daily   Orders:  -     Amylase  -     Lipase  -     Comprehensive metabolic panel  -     CBC  -     US ABDOMEN COMPLETE; Future  Primary hypertension  Assessment & Plan:  Controlled on Losartan   BP Readings from Last 4 Encounters:   04/03/23 110/70   03/08/23 126/72   03/08/23 138/82   01/29/23 (!) 148/82       Slow transit constipation  Assessment & Plan:  Controlled on probiotic and Miralax   Colonic stool burden noted on abdominal CT scan from 12/24   Followed by  Satyam who recommended Linzess, patient did not start due to cost

## 2023-04-03 NOTE — Assessment & Plan Note (Signed)
 Controlled on probiotic and Miralax   Colonic stool burden noted on abdominal CT scan from 12/24   Followed by Satyam who recommended Linzess, patient did not start due to cost

## 2023-04-03 NOTE — Result Quicknote (Signed)
 Did BMD needs appnt, tele ok

## 2023-04-04 LAB — COMPREHENSIVE METABOLIC PANEL
ALT: 21 IU/L (ref 0–32)
AST: 19 IU/L (ref 0–40)
Albumin: 4.2 g/dL (ref 3.9–4.9)
Alk Phosphatase: 66 IU/L (ref 44–121)
Anion Gap: 16 mmol/L (ref 10.0–18.0)
BUN/Creat Ratio: 21 (ref 12–28)
BUN: 16 mg/dL (ref 8–27)
Bili Total: 0.2 mg/dL (ref 0.0–1.2)
Calcium: 8.8 mg/dL (ref 8.7–10.3)
Carbon Dioxide: 23 mmol/L (ref 20–29)
Chloride: 103 mmol/L (ref 96–106)
Creat: 0.75 mg/dL (ref 0.57–1.00)
Globulin Total: 2.5 g/dL (ref 1.5–4.5)
Glucose: 127 mg/dL — ABNORMAL HIGH (ref 70–99)
Potassium: 4.1 mmol/L (ref 3.5–5.2)
Protein Total: 6.7 g/dL (ref 6.0–8.5)
Sodium: 142 mmol/L (ref 134–144)
eGFR: 86 mL/min/{1.73_m2} (ref >59)

## 2023-04-04 LAB — CBC
Hct: 41.8 % (ref 34.0–46.6)
Hgb: 13.9 g/dL (ref 11.1–15.9)
MCH: 29.5 pg (ref 26.6–33.0)
MCHC: 33.3 g/dL (ref 31.5–35.7)
MCV: 89 fL (ref 79–97)
Platelets: 221 10*3/uL (ref 150–450)
RBC: 4.71 x10E6/uL (ref 3.77–5.28)
RDW: 12.4 % (ref 11.7–15.4)
WBC: 5.1 10*3/uL (ref 3.4–10.8)

## 2023-04-04 LAB — LIPASE: Lipase: 37 U/L (ref 14–72)

## 2023-04-04 LAB — AMYLASE: Amylase: 73 U/L (ref 31–110)

## 2023-04-05 ENCOUNTER — Inpatient Hospital Stay: Admit: 2023-04-05 | Payer: MEDICARE | Primary: Family Medicine

## 2023-04-05 DIAGNOSIS — R1011 Right upper quadrant pain: Secondary | ICD-10-CM

## 2023-04-06 NOTE — H&P (Signed)
 Called patient's daughter to review normal labs and normal abdominal US   Continue omeprazole daily   F/u with GI

## 2023-04-18 ENCOUNTER — Ambulatory Visit: Admit: 2023-04-18 | Discharge: 2023-04-18 | Payer: MEDICARE | Attending: Internal Medicine | Primary: Family Medicine

## 2023-04-18 DIAGNOSIS — E559 Vitamin D deficiency, unspecified: Secondary | ICD-10-CM

## 2023-04-18 NOTE — Progress Notes (Signed)
 [facility]  [address]  [place] [address]  [phone]    Endocrinology Clinic Note    Reason for Referral:  Referring Physician: Rosy Cooper, MD    History of present illness:  [name] is a 68 y.o. female with a history of GERD referred in endocrine consultation for osteoporosis at the request of PCP.   She was on alendronate from 2009 to 03-2013.She has been on drug holiday till 03-2018.  Had been on risedronate before that.     Restarted fosamax 70 mg weekly 2020 by PCP and has been on it for 3 years with improvement in BMD. misunderstood and stopped fosamax nov 2023 but then restarred and has been on it since. BMD slightly worst in hip/spine, now has drug coverage which she didnt have before and could not get anabolics approved  No new fractures/falls    BMD  T score spine  FN   Total hip  2025           -2.9       -2.0/-1.4  2023           -2.8      -1.7    -1.4  2021           -3        -1.9     -1.2  2019           -3.3      -2.3    -1.7  2016           -2.9      -1.7  She is on  vitamin D 1000 IU daily., no falls/fractures  02-2017: BMD reviewed and worsening scores, spine -3.3, Hip -2.3, We had discussed doing Reclast last year but she was traveling to [place] and did not reschedule her Reclast.  No new fractures or falls.Most recently we discussed doing Prolia but it turns out she does not have drug coverage.  Patient went to [facility] and she would told that Fosamax will be covered.      The patient reports puberty occurred at age 37, similar to peers. Periods were regular.  She is G5P4.  Menopause occurred at age 20.  Was briefly on HRT at that time.    At around age 10 she was diagnosed with osteoporosis.  Her dairy intake is minimal: occasional yogurt, has almond milk every night.     She reports no extended period of prednisone use. She has no history of kidney stones.  She reports no constipation.   No polyuria.          Assessment/Plan:    [name] is a 68 y.o. female with a history of GERD referred in endocrine consultation for osteoporosis at the request of PCP.   She was on alendronate from 2009 to 03-2013.  Had been on risedronate before that. 09-2014 BMD with T scores -2.9 in AP spine and -1.7 in left femur.02-2017 spine -3.3, Hip -2.3 , PC we did follow bone density in 2021 with just 1 year of Fosamax and was already improving as above She has no history of fracture.  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   She was on alendronate for 6 years.  Her spinal BMD has declined since 2014 but probably not significant--it is still higher that it was compared to 2011 as bisphos worked. Hip BMD now worst.   .  she  did not have a drug coverage . So remained on fosamax untill 4025, now reporting has drug coverage and wants to start evenity, understand small but possible CVS risk     In the meantime, discussed goal of 1200 mg daily calcium via diet or pill. Gets vitamin D via citracal-D and MVI. Recent kidney function, calcium and vitamin D     RTC 70m w/labs, start evenity soon after PA     Thank you for Involving in this patient's care. Please do not hesitate to contact with questions or concerns      Rosina Lowenstein, MD     Pertinent lab review:    Lab Results   Component Value Date    CALCIUM 8.8 04/03/2023      Lab Results   Component Value Date    TSH 0.975 10/24/2022      Lab Results   Component Value Date    CALCIUM 8.8 04/03/2023    CALCIUM 10.3 03/08/2023    CALCIUM 9.9 12/08/2022    CALCIUM 9.4 10/24/2022    VITD25 47.4 03/03/2022    VITD25 48 03/10/2021    ALBUMIN 4.2 04/03/2023    ALBUMIN 4.2 10/24/2022        BMD:    === 03/09/21 ===    BD DEXA AXIAL    - Impression -  Osteoporosis        [name] 03/10/2021 7:45 AM     Medical History:    Past Medical History:   Diagnosis Date    Chronic tension headaches     Colon polyp     Dyspepsia     GERD (gastroesophageal reflux  disease)     Hx of adenomatous polyp of colon     Mood disorder      Osteoarthritis     Osteoporosis      Prolapse of female bladder, acquired 06/2022    Retina disorder     bilateral,    gets injections monthly    Vitamin D deficiency          Surgical History:    Past Surgical History:   Procedure Laterality Date    COLONOSCOPY      TOTAL VAGINAL HYSTERECTOMY            Family History:    @BIMFAMHISTORY @       Social History:    No valid surgical or medical questions entered.        Active medical problems:    Problem List Items Addressed This Visit    None          Current medications:      Current Outpatient Medications:     alendronate (Fosamax) 70 mg tablet, Take 1 tablet (70 mg) by mouth every 7 (seven) days. Take in morning with full glass of water on an empty stomach. No food, drink, or meds for 30 minutes after. Do not lie down until after first meal of day (to avoid throat irritation)., Disp: 12 tablet, Rfl: 3    calcium citrate-vitamin D3 (Citracal + D Maximum) 315 mg-6.25 mcg (250 unit) tablet, Take 1 tablet by mouth once daily., Disp: , Rfl:     losartan (Cozaar) 25 mg tablet, Take 1 tablet (25 mg) by mouth once daily., Disp: 90 tablet, Rfl: 3    multivitamin with minerals tablet, Take 1 tablet by mouth once daily., Disp: , Rfl:     omeprazole (PriLOSEC) 20 mg DR capsule, TAKE 1 CAPSULE (20 MG) BY MOUTH  IF NEEDED EACH DAY (DYSPEPSIA)., Disp: 90 capsule, Rfl: 1       Allergies:    Allergies   Allergen Reactions    Influenza Vaccine Tr-S 11 (Pf) Swelling    Penicillins Hives and Rash    Amitriptyline Palpitations    Boostrix Tdap [Diphth,Pertus(Acell),Tetanus] Rash    Flu Virus Vaccine Tv 2015-16 (18 Yr And Up),Recomb Rash    Sulfa (Sulfonamide Antibiotics) Rash            ROS:    No palpitations, no tremor, no heat intolerance. No cold intolerance.  No diarrhea or constipation. No change in hair or skin. No dysphagia, no odynophagia, no solid food or pill dysphagia. No diplopia/blurry vision.No  fevers/chills.No change in voice.No anterior neck pressure. No increase in neck size.No wheeze or DOE.No rash.No edema. No cough.No anxiety/Depression.Regular menses Rest of ROS negative or non-contributory.       Physical Exam:    There were no vitals taken for this visit.     ZOX:WRUEA alert no acute distress  HEENT: head atraumatic, normocephalic, no redness in eyes no discharge from nose  SKIN: facial skin does not show any cyanosis  RESP: speaking in full sentences, no increased respiratory effort seen  MSK: normal ROM in fingers  Neuro: facial nerves intact  Pysch: appearance, behavior and attitude-pleasant, cooperative and groomed               This real-time, interactive virtual Telehealth encounter was done by video with the patient's verbal consent.  with the patient's verbal consent. Two patient identifiers were used and confirmed. Physical location of the patient: home  Patient resides in: [place]  Physical location of the provider: office  Other participants/involvement: none       This note has been created with voice recognition software.  While this note has been edited for accuracy, this software periodically misinterprets speech resulting in errors that might not have been caught in editing.  In the event an unusual error is found in this record, please notify us  at  [phone] to resolve the error.

## 2023-05-04 ENCOUNTER — Encounter: Payer: MEDICARE | Attending: Family | Primary: Family Medicine

## 2023-05-16 ENCOUNTER — Ambulatory Visit: Admit: 2023-05-16 | Discharge: 2023-05-16

## 2023-05-16 DIAGNOSIS — M810 Age-related osteoporosis without current pathological fracture: Secondary | ICD-10-CM

## 2023-05-16 MED ORDER — romosozumab (Evenity) injection 210 mg
Freq: Once | SUBCUTANEOUS | Status: AC
Start: 2023-05-16 — End: 2023-05-16
  Administered 2023-05-16: 14:00:00 210 mg via SUBCUTANEOUS

## 2023-05-16 MED FILL — ROMOSOZUMAB-AQQG 210 MG/2.34 ML(105 MG/1.17 ML X2)SUBCUTANEOUS SYRINGE: SUBCUTANEOUS | Qty: 2 | Fill #0

## 2023-05-16 NOTE — Progress Notes (Signed)
 [name] is a 68 y.o. in office today for dose #1 of Evenity.  Authorization: per documentation on 04/18/2023, no authorization needed for medical buy and bill     Side effects reported from previous doses? N/A    Administered 2 syringes subcutaneously.     Next Evenity dose to be scheduled: 06/13/2023        [name] is a prescription medication to treat osteoporosis (thinning and weakening of bone) in women after menopause who are at high risk of fracture (broken bone), and/or cannot use another osteoporosis medicine or other osteoporosis medicines did not work well.     EVENITY is an injection that will be given to you by your healthcare provider. EVENITY is injected under your skin (subcutaneous).   You will receive an EVENITY dose (2 injections) 1 time every month for 12 doses.    You should take calcium and vitamin D while you receive EVENITY (if/as directed by your endocrine provider).   If you miss a dose of EVENITY, contact your healthcare provider as soon as possible to schedule your next dose. Your next dose of EVENITY should then be scheduled every month from the date of the last injection.   You should take good care of your teeth and gums while you receive EVENITY. You should tell your dentist that you are receiving EVENITY before you have dental work.     The most common side effects of EVENITY include:   joint pain    headache  less common side effects include injection site irritation, rash, hair loss, pain with urination, unusual thigh fractures. Risk of severe jaw bone problems (osteonecrosis) is rare. The most common risk factor associated with ONJ is tooth extraction. Call your doctor right away if you think you may be having any of these side effects.

## 2023-05-17 LAB — CALCIUM, TOTAL: Calcium: 9.5 mg/dL (ref 8.7–10.3)

## 2023-05-17 LAB — ALBUMIN: Albumin: 4.5 g/dL (ref 3.9–4.9)

## 2023-06-01 ENCOUNTER — Ambulatory Visit: Admit: 2023-06-01 | Discharge: 2023-06-01 | Payer: MEDICARE | Attending: Obstetrics & Gynecology

## 2023-06-01 DIAGNOSIS — Z01419 Encounter for gynecological examination (general) (routine) without abnormal findings: Secondary | ICD-10-CM

## 2023-06-01 NOTE — Progress Notes (Signed)
 [facility] [place]  [address] [place] [address]  [phone]    Old  [name] is a 68 y.o. female who presents for NEW PT for last two days burning when urinating , today notice a bump in vagina.  Benafiber2 tsp TID+colace 1 tab bid+add 30cc MOM if no BM 2 days for constipation  She is a Conservation officer, nature   She hold her urine while she is working and advise to stop this Publishing rights manager offered and declined  Primary care doctor:  Geophysicist/field seismologist  Neg for Tobacco, Drugs, vape and occ ETOH, does SBE   LMP  Current medication:  Allergy n  Medical history - n  Surgical history - n  Family history  - n  Work Conservation officer, nature  Colonoscopy   In 2023     next colonoscopy expected to be in  Mammogram uptdap  Last pap   @ 61 negative period pt  Last HPV -  Any history of abnormal pap:  N told that she does not need pap by her previous md  Marital status: MW G87P4, HT -4-11  Lives:  husb  Sexually active  N     HPV vaccine:  no  Emergency contact  myriad faminly history screening done on 05/16/2022  plan: she does not meet current guidelines for this test. She declined this test today.      NEW  Patient ID: [name] is a 68 y.o. female who presents for No chief complaint on file..  Status post hysterectomy - still have varies     chaperone offered and declined  OB History       Gravida   4    Para   4    Term   4    Preterm        AB        Living             SAB        IAB        Ectopic        Multiple        Live Births                   Mild cystocele  Primary care doctor: Rosy Cooper, MD   Neg for Tobacco, Drugs, vape and occ ETOH, does SBE     LMP status post hysterectomy   Current medication: monthly injection with shot monthly for one year under [facility]   Allergy n  Medical history -  Surgical history - status post tvh/anterior and posterior repaired in 07/2022 by Dr. Kemper Patterson  Family history  -  Work [employer]    Colonoscopy    01/23/2021 by Dr. Fulton Job @ [facility]-next  colonoscopy expected to be in 2028  Mammogram 01/10/2022  Last pap     Last HPV -  Any history of abnormal pap:  N told that she does not need pap by her previous md  Marital status: MW G19P4, HT -4-11  Lives:  husb  Sexually active  N     HPV vaccine:  no  Emergency contact  myriad faminly history screening done on 05/16/2022  plan: she does not meet current guidelines for this test. She declined this test today.         HPI        Review of Systems   Constitutional: Negative.    HENT: Negative.  Eyes: Negative.    Respiratory: Negative.     Cardiovascular: Negative.    Gastrointestinal: Negative.    Endocrine: Negative.    Genitourinary: Negative.    Musculoskeletal: Negative.    Skin: Negative.    Allergic/Immunologic: Negative.    Neurological: Negative.       Objective   Visit Vitals  LMP  (LMP Unknown)   OB Status Postmenopausal           11/28/2022    11:22 AM 01/18/2023    10:35 AM 01/29/2023    10:49 AM 01/29/2023    11:18 AM 03/08/2023    10:03 AM 03/08/2023     2:42 PM 04/03/2023     2:17 PM   Vitals   Systolic 156 153 161 148 138 126 110   Diastolic 76 83 90 82 82 72 70   Pulse   76  71 84 88   Temp  36.5 C (97.7 F) 36.6 C (97.8 F)   36.4 C (97.5 F) 36.6 C (97.9 F)   Height (in) 1.473 m  1.473 m   1.473 m 1.473 m   Weight (lb) 128 129.2 129  128 128 129   SpO2   99 %   97 % 98 %   BMI 26.75 kg/m2 27 kg/m2 26.96 kg/m2  26.75 kg/m2 26.75 kg/m2 26.96 kg/m2   BSA (m2) 1.54 m2 1.55 m2 1.55 m2  1.54 m2 1.54 m2 1.55 m2   Visit Report Report Report Report Report Report    Report Report    Report Report       Physical Exam  Nursing note reviewed.   Constitutional:       Appearance: Normal appearance.   HENT:      Head: Normocephalic and atraumatic.      Right Ear: External ear normal.      Left Ear: External ear normal.      Nose: Nose normal.   Cardiovascular:      Rate and Rhythm: Normal rate and regular rhythm.      Heart sounds: Normal heart sounds.   Pulmonary:      Effort: Pulmonary effort is normal.       Breath sounds: Normal breath sounds.   Chest:   Breasts:     Right: Normal.      Left: Normal.   Abdominal:      General: Bowel sounds are normal.      Palpations: Abdomen is soft.   Genitourinary:     General: Normal vulva.      Vagina: Normal.      Uterus: Absent.       Adnexa: Right adnexa normal and left adnexa normal.      Rectum: External hemorrhoid present.      Comments: Cervix absent s/p surgery  Musculoskeletal:      Cervical back: Normal range of motion and neck supple.   Skin:     General: Skin is warm and dry.   Psychiatric:         Mood and Affect: Mood normal.     Assessment/Plan   Visit for gynecologic examination  -     Pap Smear and Aptima HPV w/ Rflx HPV Genotypes 16/18, 45 on High Risk Positive  Other orders  -     PDF Report:    The patient was counseled regarding regular monthly self-examinations of the breasts, regular sustained exercise for @ least 30 minutes 3-4 times per week, routine screening interval for mammogram,  prevention of osteoporosis, importance of regular PAP smears, regular use of lap and shoulder seat belts.    Advice her to make an annual gynecological examination in one year before leaving office today    Patient was advised to check with her insurance company to sure that all tests ordered today are covered by her insurance company before heading for the tests.      She feels great now after her TVH/anterior and posterior repaired

## 2023-06-03 LAB — PAP SMEAR AND APTIMA HPV W RFLX TO HPV GENOTYPES 16/18, 45 ON HIGH RISK POS: HPV Aptima: NEGATIVE

## 2023-06-13 ENCOUNTER — Ambulatory Visit: Admit: 2023-06-13 | Discharge: 2023-06-13 | Payer: MEDICARE

## 2023-06-13 DIAGNOSIS — M810 Age-related osteoporosis without current pathological fracture: Secondary | ICD-10-CM

## 2023-06-13 MED ORDER — romosozumab (Evenity) injection 210 mg
Freq: Once | SUBCUTANEOUS | Status: AC
Start: 2023-06-13 — End: 2023-06-13
  Administered 2023-06-13: 12:00:00 210 mg via SUBCUTANEOUS

## 2023-06-13 MED FILL — ROMOSOZUMAB-AQQG 210 MG/2.34 ML(105 MG/1.17 ML X2)SUBCUTANEOUS SYRINGE: SUBCUTANEOUS | Qty: 2 | Fill #0

## 2023-06-13 NOTE — Progress Notes (Signed)
 [name] is a 68 y.o. in office today for dose #2 of Evenity.  Authorization: per documentation on 05/16/2023 referral tab, no authorization needed for medical buy and bill     Side effects reported from previous doses? reports slight lightheadedness    Administered 2 syringes subcutaneously.     Next Evenity dose to be scheduled: 07/11/2023        [name] is a prescription medication to treat osteoporosis (thinning and weakening of bone) in women after menopause who are at high risk of fracture (broken bone), and/or cannot use another osteoporosis medicine or other osteoporosis medicines did not work well.     EVENITY is an injection that will be given to you by your healthcare provider. EVENITY is injected under your skin (subcutaneous).   You will receive an EVENITY dose (2 injections) 1 time every month for 12 doses.    You should take calcium and vitamin D while you receive EVENITY (if/as directed by your endocrine provider).   If you miss a dose of EVENITY, contact your healthcare provider as soon as possible to schedule your next dose. Your next dose of EVENITY should then be scheduled every month from the date of the last injection.   You should take good care of your teeth and gums while you receive EVENITY. You should tell your dentist that you are receiving EVENITY before you have dental work.     The most common side effects of EVENITY include:   joint pain    headache  less common side effects include injection site irritation, rash, hair loss, pain with urination, unusual thigh fractures. Risk of severe jaw bone problems (osteonecrosis) is rare. The most common risk factor associated with ONJ is tooth extraction. Call your doctor right away if you think you may be having any of these side effects.

## 2023-07-11 ENCOUNTER — Ambulatory Visit: Admit: 2023-07-11 | Discharge: 2023-07-11 | Payer: MEDICARE

## 2023-07-11 DIAGNOSIS — M810 Age-related osteoporosis without current pathological fracture: Principal | ICD-10-CM

## 2023-07-11 MED ORDER — romosozumab (Evenity) injection 210 mg
Freq: Once | SUBCUTANEOUS | Status: AC
Start: 2023-07-11 — End: 2023-07-11
  Administered 2023-07-11: 12:00:00 210 mg via SUBCUTANEOUS

## 2023-07-11 MED FILL — ROMOSOZUMAB-AQQG 210 MG/2.34 ML(105 MG/1.17 ML X2)SUBCUTANEOUS SYRINGE: SUBCUTANEOUS | Qty: 2 | Fill #0

## 2023-07-11 NOTE — Progress Notes (Signed)
 [name] is a 68 y.o. in office today for dose #3 of Evenity .  Authorization: per documentation on 06/13/23 referral tab, no authorization needed for medical buy and bill     Side effects reported from previous doses? reports red and sore at injection sites    Administered 2 syringes subcutaneously.     Next Evenity  dose to be scheduled: 08/08/23        Evenity  is a prescription medication to treat osteoporosis (thinning and weakening of bone) in women after menopause who are at high risk of fracture (broken bone), and/or cannot use another osteoporosis medicine or other osteoporosis medicines did not work well.     EVENITY  is an injection that will be given to you by your healthcare provider. EVENITY  is injected under your skin (subcutaneous).   You will receive an EVENITY  dose (2 injections) 1 time every month for 12 doses.    You should take calcium  and vitamin D  while you receive EVENITY  (if/as directed by your endocrine provider).   If you miss a dose of EVENITY , contact your healthcare provider as soon as possible to schedule your next dose. Your next dose of EVENITY  should then be scheduled every month from the date of the last injection.   You should take good care of your teeth and gums while you receive EVENITY . You should tell your dentist that you are receiving EVENITY  before you have dental work.     The most common side effects of EVENITY  include:   joint pain    headache  less common side effects include injection site irritation, rash, hair loss, pain with urination, unusual thigh fractures. Risk of severe jaw bone problems (osteonecrosis) is rare. The most common risk factor associated with ONJ is tooth extraction. Call your doctor right away if you think you may be having any of these side effects.

## 2023-07-12 ENCOUNTER — Ambulatory Visit: Payer: MEDICARE | Attending: Internal Medicine

## 2023-07-19 ENCOUNTER — Ambulatory Visit: Admit: 2023-07-19 | Discharge: 2023-07-19 | Payer: MEDICARE

## 2023-07-19 DIAGNOSIS — I10 Essential (primary) hypertension: Principal | ICD-10-CM

## 2023-07-19 MED ORDER — losartan (Cozaar) 25 mg tablet
25 | ORAL_TABLET | Freq: Every day | ORAL | 3 refills | 90.00000 days | Status: AC
Start: 2023-07-19 — End: ?

## 2023-07-19 NOTE — Progress Notes (Signed)
 [facility]  [address]  [place] [address]  Dept: [phone]  Dept Fax: [phone]     Patient ID: [name] is a 68 y.o. female who presents to the office for Follow-up (BP, Meds)    Subjective    HPI    #transferring care  Previous PCP: Dr. Vita  Last OV: 04/03/23 w/ [name]  Last MAW Initial: 10/24/22    #HTN  BP Readings from Last 5 Encounters:   07/19/23 112/62   06/01/23 117/64   04/03/23 110/70   03/08/23 126/72   03/08/23 138/82     Meds: losartan  25 mg    Pt reports that she has gotten SBP high 90s 1-2x, reports that it doesn't happen often  Usually average SBPs are in the 110s    #osteoporosis  Follows Dr. Tsosie  Med: Evenity - mostly doing well  Belly sore and stomach feels funny then goes away x 1-2 days    Problem List[1]    Current Outpatient Medications   Medication Instructions    calcium  citrate-vitamin D3 (Citracal + D Maximum) 315 mg-6.25 mcg (250 unit) tablet 1 tablet, oral, Daily    losartan  (COZAAR ) 12.5 mg, oral, Daily    multivitamin with minerals tablet 1 tablet, oral, Daily    omeprazole  (PRILOSEC) 20 mg, oral, Daily PRN        Allergies[2]    Medical History[3]  Surgical History[4]    Family History[5]   Social History[6]    ROS  See HPI    Objective    Visit Vitals  BP 112/62 (BP Location: Left arm, Patient Position: Sitting, BP Cuff Size: Adult)   Pulse 73   Temp 36.3 C (97.4 F) (Temporal)   Wt 58.5 kg   LMP  (LMP Unknown)   SpO2 96%   BMI 26.05 kg/m   OB Status Postmenopausal   BSA 1.56 m       Physical Exam  Vitals reviewed.   Constitutional:       Appearance: Normal appearance.   Pulmonary:      Effort: Pulmonary effort is normal.   Neurological:      Mental Status: She is alert and oriented to person, place, and time.   Psychiatric:         Mood and Affect: Mood normal.         Behavior: Behavior normal.       Assessment/Plan   [name] was seen today for follow-up.  Primary hypertension   Assessment & Plan:  Well  controlled    BP Readings from Last 5 Encounters:   07/19/23 112/62   06/01/23 117/64   04/03/23 110/70   03/08/23 126/72   03/08/23 138/82     Secondary to a couple of SBPs <100  Plan to decrease losartan  to 12.5 mg daily  Encourage to optimize nutrition low in sodium  Continue to check BP daily  Pt will reach out to NP if SBP continuous >=130 and we can discuss potentially increasing losartan  back to 25 mg  Orders:  -     losartan  (Cozaar ) 25 mg tablet; Take 0.5 tablets (12.5 mg) by mouth once daily.         [1]   Patient Active Problem List  Diagnosis    Osteoporosis     Osteoarthritis    Benign neoplasm of colon, unspecified    Vision disorder    Functional dyspepsia    Vitamin D  deficiency    Chronic  tension headache    Allergic rhinitis    RUQ pain    Gastroesophageal reflux disease    S/P vaginal hysterectomy    Mixed hyperlipidemia     Hyperglycemia    Lower abdominal pain    Primary hypertension     Slow transit constipation   [2]   Allergies  Allergen Reactions    Influenza Vaccine Tr-S 11 (Pf) Swelling    Penicillins Hives and Rash    Amitriptyline  Palpitations    Boostrix Tdap [Diphth,Pertus(Acell),Tetanus] Rash    Flu Virus Vaccine Tv 2015-16 (18 Yr And Up),Recomb Rash    Sulfa (Sulfonamide Antibiotics) Rash   [3]   Past Medical History:  Diagnosis Date    Chronic tension headaches     Colon polyp     Dyspepsia     GERD (gastroesophageal reflux disease)     Hx of adenomatous polyp of colon     Mood disorder      Osteoarthritis     Osteoporosis      Prolapse of female bladder, acquired 06/2022    Retina disorder     bilateral,    gets injections monthly    Vitamin D  deficiency    [4]   Past Surgical History:  Procedure Laterality Date    COLONOSCOPY      TOTAL VAGINAL HYSTERECTOMY     [5]   Family History  Problem Relation Name Age of Onset    Breast cancer Neg Hx     [6]   Social History  Tobacco Use    Smoking status: Never     Passive exposure: Never    Smokeless tobacco: Never   Vaping Use    Vaping  status: Never Used   Substance Use Topics    Alcohol use: Not Currently     Comment: rare    Drug use: Never

## 2023-07-19 NOTE — Assessment & Plan Note (Signed)
 Well controlled    BP Readings from Last 5 Encounters:   07/19/23 112/62   06/01/23 117/64   04/03/23 110/70   03/08/23 126/72   03/08/23 138/82     Secondary to a couple of SBPs <100  Plan to decrease losartan  to 12.5 mg daily  Encourage to optimize nutrition low in sodium  Continue to check BP daily  Pt will reach out to NP if SBP continuous >=130 and we can discuss potentially increasing losartan  back to 25 mg

## 2023-07-27 IMAGING — MR e+1 OMBRO DIR
4 of 5 series · 19 of 40 positions shown · non-contrast
Comparison: none

[Series 104: (id)/ax fse-ir · axial · 5.0mm · 0.39mm/px · z∈[-30,+39]mm · 3 of 17 slices shown]
[im 3/17]
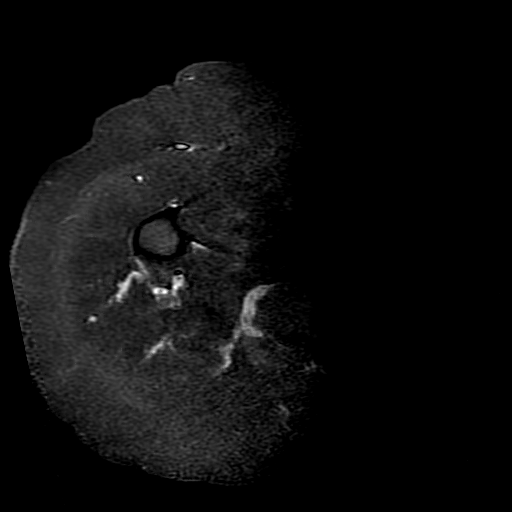
[im 9/17]
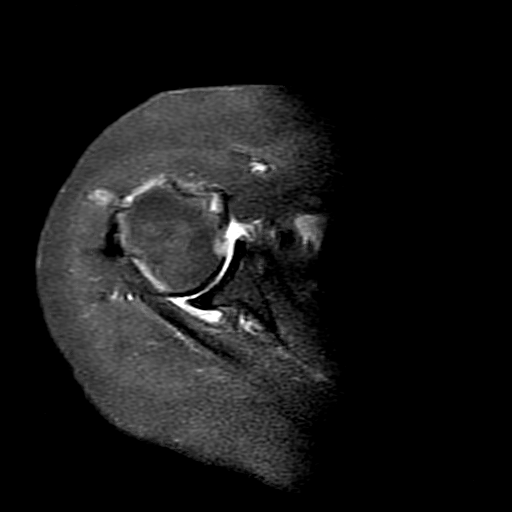
[im 15/17]
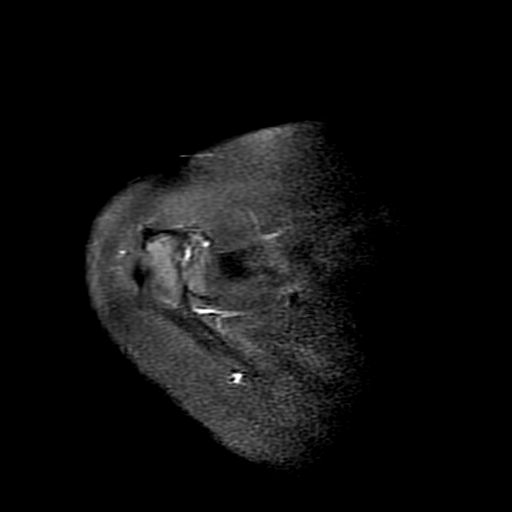

[Series 105: T1 · oblique · 5.0mm · 0.43mm/px · 7 of 12 slices shown (1 of 2)]
[im 1/12]
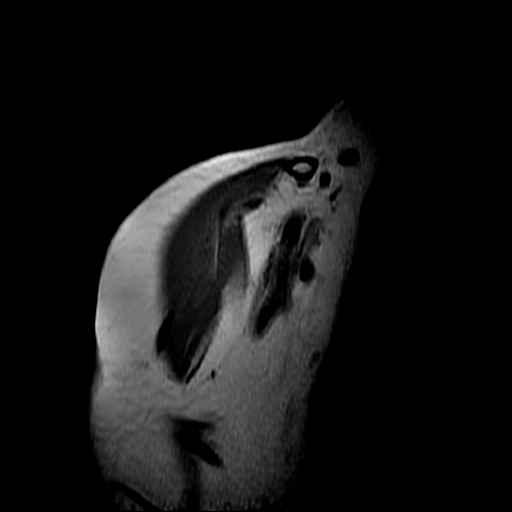
[im 2/12]
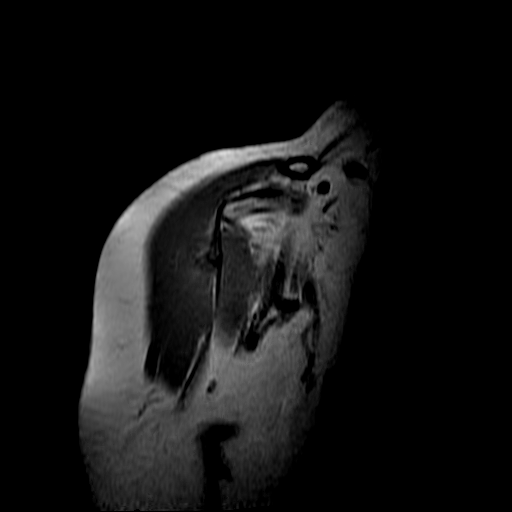
[im 4/12]
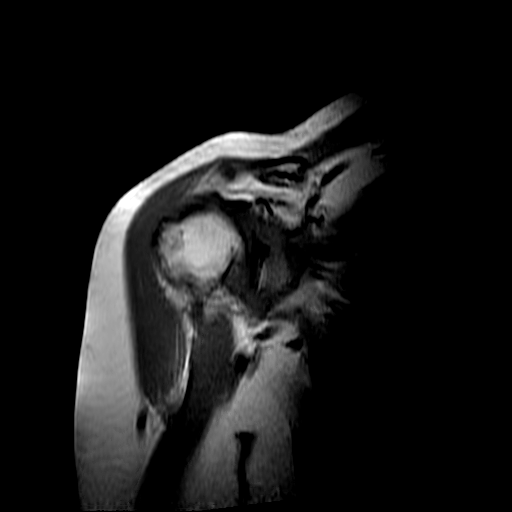
[im 6/12]
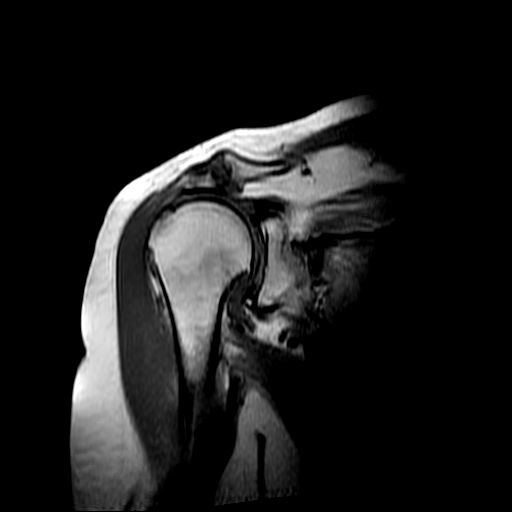
[im 8/12]
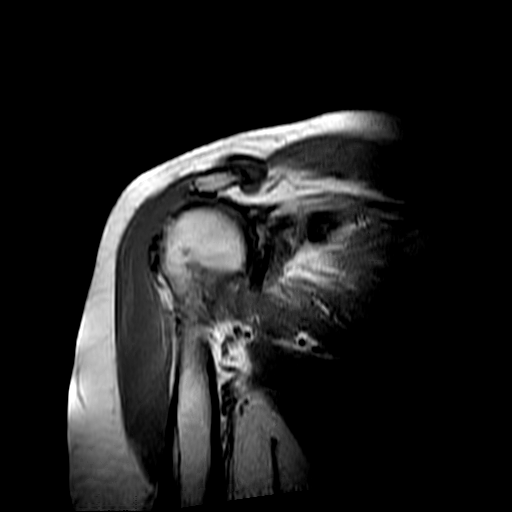
[im 10/12]
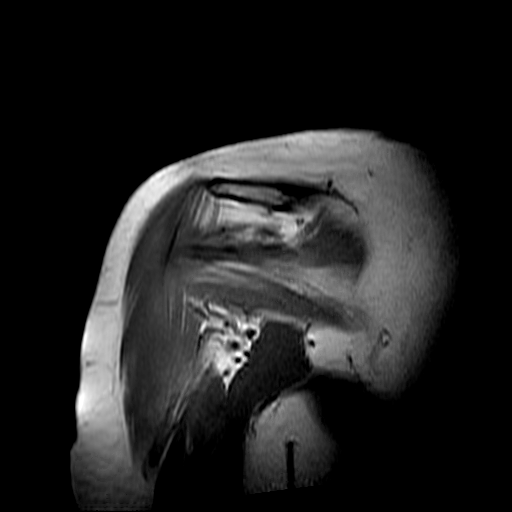
[im 12/12]
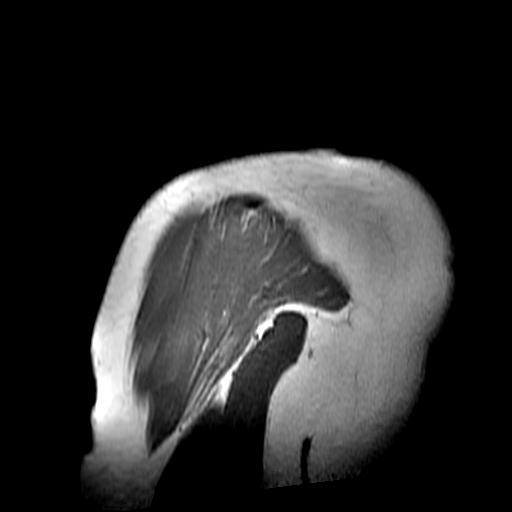

[Series 106: (id)/cor fse-ir · oblique · 5.0mm · 0.43mm/px · 3 of 12 slices shown]
[im 2/12]
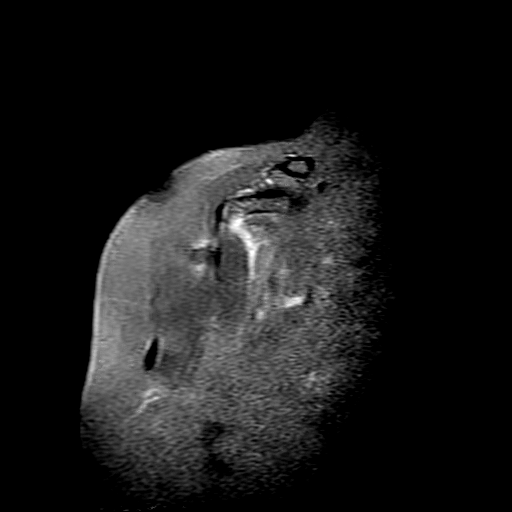
[im 6/12]
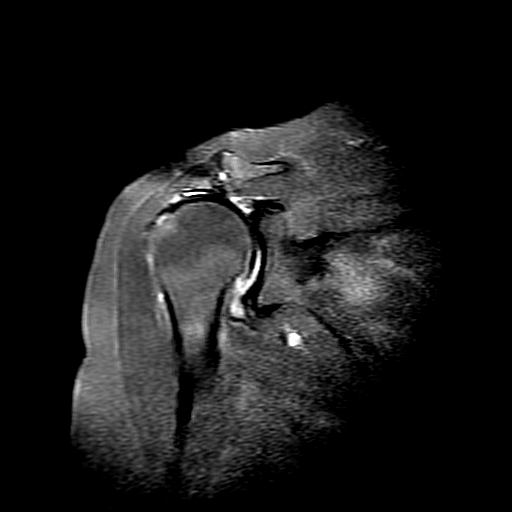
[im 10/12]
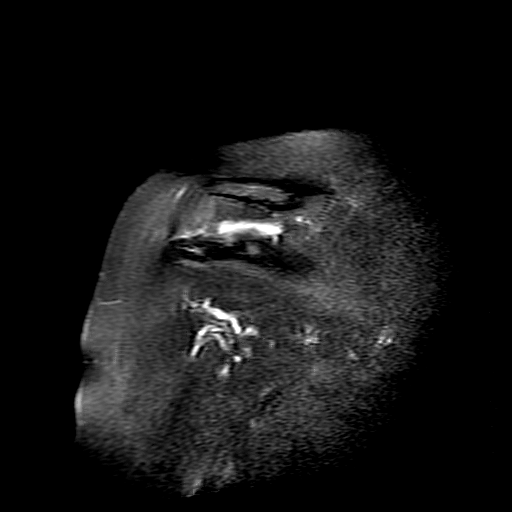

[Series 108: T1 · oblique · 5.0mm · 0.35mm/px · 6 of 16 slices shown (2 of 2)]
[im 1/16]
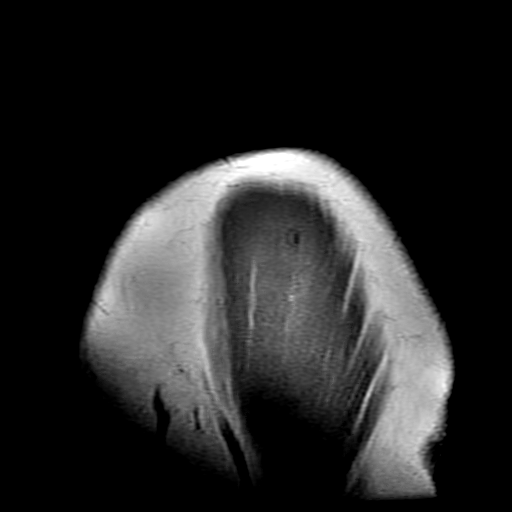
[im 2/16]
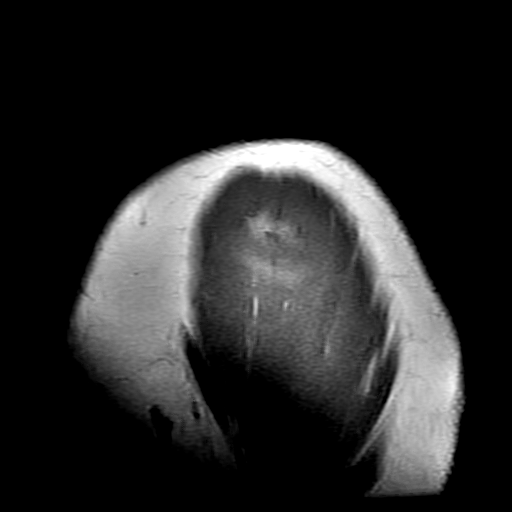
[im 4/16]
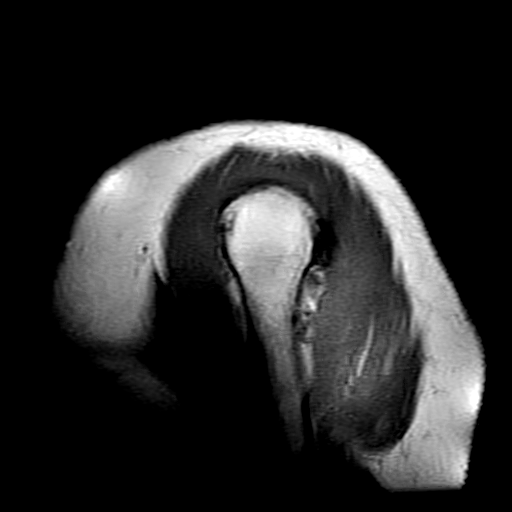
[im 6/16]
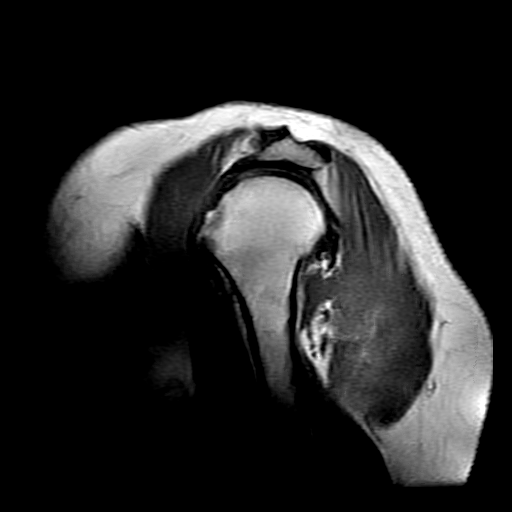
[im 8/16]
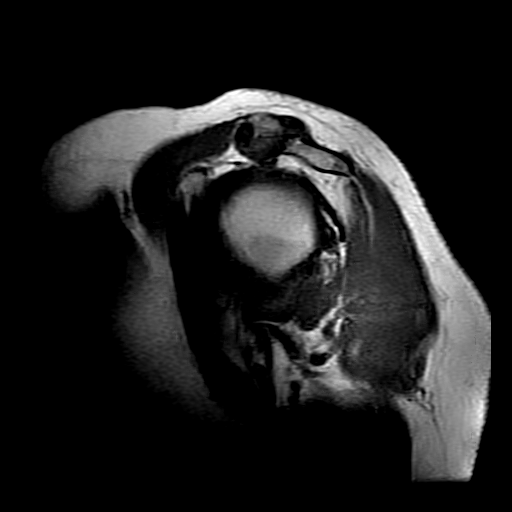
[im 14/16]
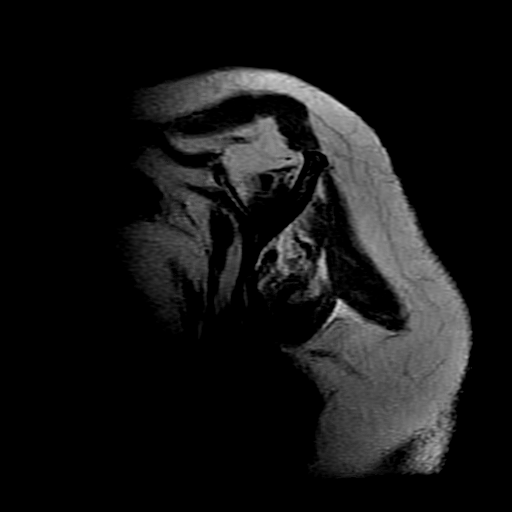

[19 of 40 positions shown; findings below may reference images not displayed]

RESSONÂNCIA MAGNÉTICA DO OMBRO DIREITO

TÉCNICA:
Exame realizado em equipamento de ressonância magnética com sequências, ponderações e planos específicos para o segmento de interesse, sem a administração endovenosa do meio de contraste.

RESULTADO:
Acrômio tipo II de Qismret.
Redução do espaço articular acrômio umeral.
Alterações degenerativas acromioclaviculares e glenoumerais com osteófitos marginais.
Moderada distensão líquida da bursa subacromial / subdeltoidea.
Moderado derrame articular glenoumeral.
Rotura transfixante comprometendo toda a extensão do supraespinhal com coto tendíneo distando 4,1 cm da sua inserção habitual e medindo até 0,4 cm de espessura máxima.
Indefinição das fibras distais do infraespinhal.
Tendão do subescapular apresenta irregularidade de contornos do terço superior e médio, distalmente, com fibras remanescentes no terço inferior que apresentam-se espessadas e heterogêneas apresentando ainda imagens lineares de hipersinal em T2 que se estende intrassubstancialmente.
Indefinição das fibras intra-articulares e deflexionais do cabo longo do bíceps.
Redução volumétrica associada a lipossubstituição dos ventres musculares do supraespinhal, infraespinhal e subescapular (Sekiko modificado grau IV).

CONCLUSÃO:
Estudo de ressonância magnética do ombro direito mostra:
Alterações degenerativas acromioclavicular e glenoumeral.
Moderada bursite subacromial / subdeltoidea.
Derrame articular glenoumeral.
Rotura transfixante comprometendo toda a extensão do supraespinhal.
Rotura transfixante comprometendo as fibras superiores e do terço médio do infraespinhal e do subescapular.
Indefinição das fibras intra-articulares do cabo longo do bíceps.

## 2023-08-08 ENCOUNTER — Ambulatory Visit: Admit: 2023-08-08 | Discharge: 2023-08-08 | Payer: MEDICARE

## 2023-08-08 DIAGNOSIS — M810 Age-related osteoporosis without current pathological fracture: Principal | ICD-10-CM

## 2023-08-08 MED ORDER — romosozumab (Evenity) injection 210 mg
Freq: Once | SUBCUTANEOUS | Status: AC
Start: 2023-08-08 — End: 2023-08-08
  Administered 2023-08-08: 12:00:00 210 mg via SUBCUTANEOUS

## 2023-08-08 MED FILL — ROMOSOZUMAB-AQQG 210 MG/2.34 ML(105 MG/1.17 ML X2)SUBCUTANEOUS SYRINGE: SUBCUTANEOUS | Qty: 2 | Fill #0

## 2023-08-08 NOTE — Progress Notes (Signed)
 [name] is a 68 y.o. in office today for dose #4 of Evenity .  Authorization: per documentation on 06/13/23 (in referrals), no authorization needed for medical buy and bill     Side effects reported from previous doses? denies    Administered 2 syringes subcutaneously.     Next Evenity  dose to be scheduled: 09/05/23        Evenity  is a prescription medication to treat osteoporosis (thinning and weakening of bone) in women after menopause who are at high risk of fracture (broken bone), and/or cannot use another osteoporosis medicine or other osteoporosis medicines did not work well.     EVENITY  is an injection that will be given to you by your healthcare provider. EVENITY  is injected under your skin (subcutaneous).   You will receive an EVENITY  dose (2 injections) 1 time every month for 12 doses.    You should take calcium  and vitamin D  while you receive EVENITY  (if/as directed by your endocrine provider).   If you miss a dose of EVENITY , contact your healthcare provider as soon as possible to schedule your next dose. Your next dose of EVENITY  should then be scheduled every month from the date of the last injection.   You should take good care of your teeth and gums while you receive EVENITY . You should tell your dentist that you are receiving EVENITY  before you have dental work.     The most common side effects of EVENITY  include:   joint pain    headache  less common side effects include injection site irritation, rash, hair loss, pain with urination, unusual thigh fractures. Risk of severe jaw bone problems (osteonecrosis) is rare. The most common risk factor associated with ONJ is tooth extraction. Call your doctor right away if you think you may be having any of these side effects.

## 2023-08-09 ENCOUNTER — Ambulatory Visit: Payer: MEDICARE | Attending: Internal Medicine

## 2023-09-04 NOTE — Telephone Encounter (Signed)
 Department Name: GI    Patient: [name]  MRN: [id number]  Agent: Rosaria Sites    [facility] Scheduling Message    Patient requesting call back from: Doctor/Nurse    In regards to: Bloating and stomach pains for over a week now and would like a call back to discuss what they can do.    Best call back number: [phone]

## 2023-09-04 NOTE — Telephone Encounter (Addendum)
 SWP. For the last couple of months pt is  experiencing terrible burning/ reflux symptoms 1 hour after she eats anything. Pt gets short term relief when she takes 2 Tums. Pt has also tried Mylanta and alka- seltzer, but they don't help.   Pt reports she has been on omeprazole  since 07/2012 EGD showed esophagitis and gastritis. Pt stopped taking it 1 week ago because she is very nervous about side effects of long term use. Pt states it wasn't helping at the time she stopped.   Pt eats a bland diet - no acidic foods, fatty or spicy foods, avoids chocolate, onions, garlic and carbonated drinks. Pt does not drink alcohol. Pt has one cup of coffee in the morning.   Pt has OV with VS on 1/22, but can not wait until then.     CC VS- please advise.

## 2023-09-05 ENCOUNTER — Ambulatory Visit: Admit: 2023-09-05 | Discharge: 2023-09-05 | Payer: MEDICARE

## 2023-09-05 DIAGNOSIS — M810 Age-related osteoporosis without current pathological fracture: Principal | ICD-10-CM

## 2023-09-05 MED ORDER — pantoprazole (ProtoNix) 40 mg EC tablet
40 | ORAL_TABLET | Freq: Every day | ORAL | 3 refills | 30.00000 days | Status: DC
Start: 2023-09-05 — End: 2023-10-24

## 2023-09-05 MED ORDER — romosozumab (Evenity) injection 210 mg
Freq: Once | SUBCUTANEOUS | Status: AC
Start: 2023-09-05 — End: 2023-09-05
  Administered 2023-09-05: 13:00:00 210 mg via SUBCUTANEOUS

## 2023-09-05 MED FILL — ROMOSOZUMAB-AQQG 210 MG/2.34 ML(105 MG/1.17 ML X2)SUBCUTANEOUS SYRINGE: SUBCUTANEOUS | Qty: 2 | Fill #0

## 2023-09-05 NOTE — Telephone Encounter (Signed)
 Recommend trying an alternate PPI until we can see pt in clinic to determine best next steps.    -Lifestyle modifications to minimize reflux: avoid potential food triggers such as caffeine, chocolates, peppermint, alcohol, foods high in acidic and fat content; elevate the head of your bed; eat small frequent meals; avoid eating late (within 3-4 hours of bedtime), maintain healthy weight  -continue tums prn  -Take pantoprazole  40mg  daily. For best results, take on an empty stomach 30 minutes before first meal of the day.    Clinical staff - pls call pt with above  SP - can we move pt's OV up to a sooner open slot?

## 2023-09-05 NOTE — Progress Notes (Signed)
 [name] is a 68 y.o. in office today for dose #5 of Evenity .  Authorization: per documentation on 07/11/23 (in referrals), no authorization needed for medical buy and bill     Side effects reported from previous doses? denies    Administered 2 syringes subcutaneously.     Next Evenity  dose to be scheduled: 10/03/23        Evenity  is a prescription medication to treat osteoporosis (thinning and weakening of bone) in women after menopause who are at high risk of fracture (broken bone), and/or cannot use another osteoporosis medicine or other osteoporosis medicines did not work well.     EVENITY  is an injection that will be given to you by your healthcare provider. EVENITY  is injected under your skin (subcutaneous).   You will receive an EVENITY  dose (2 injections) 1 time every month for 12 doses.    You should take calcium  and vitamin D  while you receive EVENITY  (if/as directed by your endocrine provider).   If you miss a dose of EVENITY , contact your healthcare provider as soon as possible to schedule your next dose. Your next dose of EVENITY  should then be scheduled every month from the date of the last injection.   You should take good care of your teeth and gums while you receive EVENITY . You should tell your dentist that you are receiving EVENITY  before you have dental work.     The most common side effects of EVENITY  include:   joint pain    headache  less common side effects include injection site irritation, rash, hair loss, pain with urination, unusual thigh fractures. Risk of severe jaw bone problems (osteonecrosis) is rare. The most common risk factor associated with ONJ is tooth extraction. Call your doctor right away if you think you may be having any of these side effects.

## 2023-09-05 NOTE — Telephone Encounter (Addendum)
 SWP relayed below provider message and recommendation. Pt verbalized understanding and agreed to plan.   Also pt stated to make a note that it is Ok to speak with her daughter regarding her medical information.

## 2023-09-06 NOTE — Telephone Encounter (Signed)
 SWP & daughter to move up OV  R/s to 9/24 at 9:30 AM with [facility]

## 2023-10-03 ENCOUNTER — Ambulatory Visit: Admit: 2023-10-03 | Discharge: 2023-10-03 | Payer: MEDICARE

## 2023-10-03 DIAGNOSIS — K219 Gastro-esophageal reflux disease without esophagitis: Principal | ICD-10-CM

## 2023-10-03 DIAGNOSIS — M810 Age-related osteoporosis without current pathological fracture: Principal | ICD-10-CM

## 2023-10-03 MED ORDER — romosozumab (Evenity) injection 210 mg
Freq: Once | SUBCUTANEOUS | Status: AC
Start: 2023-10-03 — End: 2023-10-03
  Administered 2023-10-03: 12:00:00 210 mg via SUBCUTANEOUS

## 2023-10-03 MED FILL — ROMOSOZUMAB-AQQG 210 MG/2.34 ML(105 MG/1.17 ML X2)SUBCUTANEOUS SYRINGE: SUBCUTANEOUS | Qty: 2 | Fill #0

## 2023-10-03 NOTE — Progress Notes (Signed)
 [facility] - Clinic Note    Patient name: [name]  DOB: 01-07-1956  PCP: PARTICIA LIZETTE CORNERS, NP    CC:  Established pt - GERD, constipation, hx of colon polyps    HPI:  Accompanied by daughter. Patient reports no upper GI symptoms with the change of PPI to pantoprazole  40mg /day. Was on omeprazole  for 10+ years. No abd pain, N/V.Bowel habits stable with metamucil 1tsp daily. Will stool softener PRN. Not using miralax currently, did not find effective. Averages 1 Bm every other day. No blood, no melena. Wt stable     Denies heartburn, regurgitation, abdominal pain, abdominal distension, dysphagia, odynophagia, n/v, hematemesis, constipation, diarrhea, hematochezia, melena, unintentional wt loss.    GI Hx/Data:  Nml Calcium  05/2023  Nml CBC, lipase, amylase, CMP 03/2023  Abd US  03/2023  No evidence of a gallstone.  Visualized liver is normal in echotexture without evidence of a mass lesion.    CTAP 12/24:  1. Large volume of retained stool is seen within the redundant appearing colon. No acute bowel obstruction or inflammation is seen. There is colonic diverticulosis along the descending and sigmoid colon.  2. Status post hysterectomy. No adnexal mass. No free fluid or free air.  3. No intra-abdominal mass or adenopathy.    EGD 7/14: LA A esophagitis, gastritis, bx neg for BE/HP/celiac    COL 1/23: tics, - 5 yrs  COL 9/19: tics, 3 SSL, 1 TA  COL 8/14: nml TI, 1 TA, tics    Current Problems: Problem List[1]    Current Medications: Current Medications[2]    Past Medical History: Medical History[3]    Objective:     Recent Labs:  No visits with results within 3 Month(s) from this visit.   Latest known visit with results is:   Office Visit on 06/01/2023   Component Date Value Ref Range Status    Interpretation 06/01/2023 NILM   Final    Category: 06/01/2023 NIL   Final    Adequacy: 06/01/2023 SECNI   Final    Clinician provided ICD10: 06/01/2023    Final    Performed by: 06/01/2023    Final    Note: 06/01/2023     Final    TEST METHODOLOGY: 06/01/2023    Final    HPV Aptima 06/01/2023 Negative  Negative Final       Vitals:   BP 126/74   Pulse 66   Ht 1.499 m   Wt 58.5 kg   LMP  (LMP Unknown)   BMI 26.05 kg/m   Wt Readings from Last 3 Encounters:   10/03/23 58.5 kg   07/19/23 58.5 kg   06/01/23 59 kg       ROS:  See HPI. All other systems negative.     Physical Exam:  Gen: well-developed, NAD, alert and oriented  Skin: no jaundice, lesions or visible rashes  HEENT: no scleral icterus, MMM  Respiratory: no accessory muscle use  Abd: soft, NT, ND, no rebound/guarding  Psych: normal mood and affect    A/P:   Diagnoses and all orders for this visit:  Gastroesophageal reflux disease, unspecified whether esophagitis present  Resolution of upper GI symptoms with initiation of pantoprazole  40 mg once a day replacing omeprazole  20 mg a day.  Unclear why omeprazole  became ineffective after many years use.  Will patient continue with pantoprazole  40 mg once a day. We discussed the potential risks of long-term use of PPI including decreased bone strength, CKD, dementia, hypomagnesemia and risk of  certain infections, although data supporting the potential for some of these side effects remains limited.   Chronic idiopathic constipation  Stable with metamucil daily and colace PRN. Miralax was not effective, thus stopped. Continue with current regimen  History of colon polyps  UTD with CRC screeing, due 01/2026      Total time spent (excluding billable services): 30  Preparing to see the patient.  Time the provider spends taking a patient's history.  Performing a medically appropriate exam and/or evaluation.  Counseling and educating the patient/family/caregiver.  Ordering medications, tests, or procedures.  Referring and communicating with other healthcare professionals.  Time spent documenting clinical information.    This record was dictated using voice recognition software.  It may contain inherent spelling or dictation inaccuracies  due to this system.  Please do not hesitate to contact our office for clarification or questions regarding the contents of this document.      RTC in 4 mo         [1]   Patient Active Problem List  Diagnosis    Osteoporosis     Osteoarthritis    Benign neoplasm of colon, unspecified    Vision disorder    Functional dyspepsia    Vitamin D  deficiency    Chronic tension headache    Allergic rhinitis    RUQ pain    Gastroesophageal reflux disease    S/P vaginal hysterectomy    Mixed hyperlipidemia     Hyperglycemia    Lower abdominal pain    Primary hypertension     Slow transit constipation    Chronic idiopathic constipation    History of colon polyps   [2]   Current Outpatient Medications:     calcium  citrate-vitamin D3 (Citracal + D Maximum) 315 mg-6.25 mcg (250 unit) tablet, Take 1 tablet by mouth once daily., Disp: , Rfl:     losartan  (Cozaar ) 25 mg tablet, Take 0.5 tablets (12.5 mg) by mouth once daily., Disp: 90 tablet, Rfl: 3    multivitamin with minerals tablet, Take 1 tablet by mouth once daily., Disp: , Rfl:     pantoprazole  (ProtoNix ) 40 mg EC tablet, Take 1 tablet (40 mg) by mouth once daily. Do not crush, chew, or split. Take on an empty stomach about 30 minutes before a meal, Disp: 90 tablet, Rfl: 3  No current facility-administered medications for this visit.  [3]   Past Medical History:  Diagnosis Date    Chronic tension headaches     Colon polyp     Dyspepsia     GERD (gastroesophageal reflux disease)     Hx of adenomatous polyp of colon     Mood disorder      Osteoarthritis     Osteoporosis      Prolapse of female bladder, acquired 06/2022    Retina disorder     bilateral,    gets injections monthly    Vitamin D  deficiency

## 2023-10-03 NOTE — Progress Notes (Signed)
 [name] is a 68 y.o. in office today for dose #6 of Evenity .  Authorization: per documentation on 07/11/23 (in referrals), no authorization needed for medical buy and bill     Side effects reported from previous doses? denies    Administered 2 syringes subcutaneously.     Next Evenity  dose to be scheduled: 10/31/23        Evenity  is a prescription medication to treat osteoporosis (thinning and weakening of bone) in women after menopause who are at high risk of fracture (broken bone), and/or cannot use another osteoporosis medicine or other osteoporosis medicines did not work well.     EVENITY  is an injection that will be given to you by your healthcare provider. EVENITY  is injected under your skin (subcutaneous).   You will receive an EVENITY  dose (2 injections) 1 time every month for 12 doses.    You should take calcium  and vitamin D  while you receive EVENITY  (if/as directed by your endocrine provider).   If you miss a dose of EVENITY , contact your healthcare provider as soon as possible to schedule your next dose. Your next dose of EVENITY  should then be scheduled every month from the date of the last injection.   You should take good care of your teeth and gums while you receive EVENITY . You should tell your dentist that you are receiving EVENITY  before you have dental work.     The most common side effects of EVENITY  include:   joint pain    headache  less common side effects include injection site irritation, rash, hair loss, pain with urination, unusual thigh fractures. Risk of severe jaw bone problems (osteonecrosis) is rare. The most common risk factor associated with ONJ is tooth extraction. Call your doctor right away if you think you may be having any of these side effects.

## 2023-10-03 NOTE — Patient Instructions (Addendum)
 [place] recall 01/2026  Continue pantoprazole  40mg /day, 30 minutes before breakfast or dinner  GERD lifestyle modifications  Avoid known trigger foods, common ones are chocolate, caffeine, spicy food, citrus fruits, tomatoes, peppers, onions, garlic, fried foods, alcohol.  Avoid over eating  Avoid eating close to bedtime  Avoid NSAIDs such as ibuprofen, motrin, aleve, advil, naproxen  Recommend daily Vitamin D3 supplement, 1000-2000iu  Colace 1-2 tablets daily or as needed  2 kiwis daily can help with constipation   High fiber diet, 25-30 grams/day  Daily powdered fiber supplement   Adequate water intake, at least 64oz of fluids a day  Frequent exercise as tolerated  Avoid straining  Consider using a squatty potty or small stool when going to the bathroom  Follow up with Dr. Virgilio in 4 months

## 2023-10-22 ENCOUNTER — Ambulatory Visit: Payer: MEDICARE | Attending: Internal Medicine

## 2023-10-24 MED ORDER — pantoprazole (ProtoNix) 40 mg EC tablet
40 | ORAL_TABLET | Freq: Every day | ORAL | 3 refills | 30.00000 days | Status: DC
Start: 2023-10-24 — End: 2023-10-30

## 2023-10-24 NOTE — Telephone Encounter (Signed)
 Please confirm that patient is taking this medication correctly.     Protonix  should be once daily 30 min before eating.     If having breakthrough symptoms can try Tums, Gaviscon, Maalox or even pepcid.    Will cc Josh as he was last to meet with this patient

## 2023-10-30 NOTE — Telephone Encounter (Signed)
 Patient stated that she was taking this medication incorrectly and now is completely out of medication. Please assist.     Pantoprazole  40mg        Pharmacy: [id number] - [place], [place] - [address] [phone]     Best call back: [phone]

## 2023-10-31 ENCOUNTER — Ambulatory Visit: Admit: 2023-10-31 | Discharge: 2023-10-31 | Payer: MEDICARE

## 2023-10-31 ENCOUNTER — Encounter: Payer: MEDICARE | Attending: Family Medicine

## 2023-10-31 ENCOUNTER — Ambulatory Visit: Payer: MEDICARE

## 2023-10-31 DIAGNOSIS — M810 Age-related osteoporosis without current pathological fracture: Secondary | ICD-10-CM

## 2023-10-31 MED ORDER — romosozumab (Evenity) injection 210 mg
Freq: Once | SUBCUTANEOUS | Status: AC
Start: 2023-10-31 — End: 2023-10-31
  Administered 2023-10-31: 12:00:00 210 mg via SUBCUTANEOUS

## 2023-10-31 MED ORDER — pantoprazole (ProtoNix) 40 mg EC tablet
40 | ORAL_TABLET | Freq: Every day | ORAL | 3 refills | 30.00000 days | Status: AC
Start: 2023-10-31 — End: ?

## 2023-10-31 MED FILL — ROMOSOZUMAB-AQQG 210 MG/2.34 ML(105 MG/1.17 ML X2)SUBCUTANEOUS SYRINGE: SUBCUTANEOUS | Qty: 2 | Fill #0

## 2023-10-31 NOTE — Telephone Encounter (Signed)
**Note De-identified  [place] Obfuscation** Please advise 

## 2023-10-31 NOTE — Telephone Encounter (Signed)
 Patient calling in as they missed call   CB# [phone]  Thank you

## 2023-10-31 NOTE — Progress Notes (Signed)
 [name] is a 68 y.o. in office today for dose #7 of Evenity .  Authorization: per documentation on 07/11/23 (in referrals), no authorization needed for medical buy and bill     Side effects reported from previous doses? denies    Administered 2 syringes subcutaneously.     Next Evenity  dose to be scheduled: 11/28/2023        Evenity  is a prescription medication to treat osteoporosis (thinning and weakening of bone) in women after menopause who are at high risk of fracture (broken bone), and/or cannot use another osteoporosis medicine or other osteoporosis medicines did not work well.     EVENITY  is an injection that will be given to you by your healthcare provider. EVENITY  is injected under your skin (subcutaneous).   You will receive an EVENITY  dose (2 injections) 1 time every month for 12 doses.    You should take calcium  and vitamin D  while you receive EVENITY  (if/as directed by your endocrine provider).   If you miss a dose of EVENITY , contact your healthcare provider as soon as possible to schedule your next dose. Your next dose of EVENITY  should then be scheduled every month from the date of the last injection.   You should take good care of your teeth and gums while you receive EVENITY . You should tell your dentist that you are receiving EVENITY  before you have dental work.     The most common side effects of EVENITY  include:   joint pain    headache  less common side effects include injection site irritation, rash, hair loss, pain with urination, unusual thigh fractures. Risk of severe jaw bone problems (osteonecrosis) is rare. The most common risk factor associated with ONJ is tooth extraction. Call your doctor right away if you think you may be having any of these side effects.

## 2023-10-31 NOTE — Telephone Encounter (Signed)
 Called PT unable to reach LVM to call us back.

## 2023-10-31 NOTE — Telephone Encounter (Signed)
 SWP and relayed provider message, patient verbalized understanding and confirmed she will only take prescribed pantoprazole  once per day from now on.

## 2023-11-02 NOTE — Telephone Encounter (Signed)
 I called the pharmacy to double check Rx they stated its there just to early to fill please deny

## 2023-11-08 ENCOUNTER — Ambulatory Visit: Admit: 2023-11-08 | Discharge: 2023-11-08 | Payer: MEDICARE

## 2023-11-08 NOTE — Assessment & Plan Note (Signed)
 Lab Results   Component Value Date    CHOL 226 (H) 10/24/2022     Lab Results   Component Value Date    HDL 94 10/24/2022     Lab Results   Component Value Date    LDLCALC 115 (H) 10/24/2022     Lab Results   Component Value Date    TRIG 97 10/24/2022     The 10-year ASCVD risk score (Arnett DK, et al., 2019) is: 10.4%    Values used to calculate the score:      Age: 68 years      Clinically relevant sex: Female      Is Non-Hispanic African American: No      Diabetic: No      Tobacco smoker: No      Systolic Blood Pressure: 120 mmHg      Is BP treated: Yes      HDL Cholesterol: 94 mg/dL      Total Cholesterol: 226 mg/dL    Not currently on medications  Fasting lipid panel ordered  Consider discussing rx statin

## 2023-11-08 NOTE — Assessment & Plan Note (Addendum)
 Stable  Follows GI  Med: pantoprazole  40 mg  Pt reports that she is mindful of her diet to avoid triggers

## 2023-11-08 NOTE — Assessment & Plan Note (Signed)
 Stable  Follows GI  Med: pantoprazole  40 mg  Pt reports that she is mindful of her diet to avoid triggers

## 2023-11-08 NOTE — Assessment & Plan Note (Signed)
 Labs: ordered  Immunizations: shingrix utd. Recommend tdap and PCV20  Screenings: pap smear/mammo/colonoscopy/dexa utd    Patient Health Questionnaire-9 Score: 0   Interpretation: Negative screening.     Follow-up & Interventions:   - Maintain annual screening - No additional Follow-up required.    Generalized Anxiety Disorder Screening - GAD-7 Score: 0  Interpretation: Negative screening.  Follow up & Intervention: Maintain annual screening - No additional Follow-up required
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
## 2023-11-08 NOTE — Assessment & Plan Note (Signed)
 Well controlled    BP Readings from Last 5 Encounters:   11/08/23 120/70   10/03/23 126/74   07/19/23 112/62   06/01/23 117/64   04/03/23 110/70     Med: losartan  12.5 mg  Encourage to optimize nutrition low in sodium and increase physical activity  Plan to decrease losartan  to 12.5 mg daily  Encourage to optimize nutrition low in sodium  Continue to check BP daily  Pt will reach out to NP if SBP continuous >=130 and we can discuss potentially increasing losartan  back to 25 mg

## 2023-11-08 NOTE — Assessment & Plan Note (Signed)
 Follows endocrinology  Dexa scan: 04/03/2023: osteoporosis  Per previous PCP: She was on alendronate  from 2009 to 03-2013.She has been on drug holiday till 03-2018.  Had been on risedronate before that.   Restarted fosamax  70 mg weekly 2020 by PCP and has been on it for 3 years with improvement in BMD. Her spinal BMD has declined since 2014 but probably not significant--it is still higher that it was compared to 2011 as bisphos worked. Hip BMD now worst. Endo discussed options of both Reclast versus Prolia .  She had initially wanted to do Reclast but is very afraid of side effects and today wanted Prolia.  But it appears that she does not have a drug coverage.  She has been oral Fosamax  again since 03-2019, F/U BMD 2023,Improved.   total 5 years of oral bisphos. Misunderstood and stopped nov 2023 and asked to restart    Med: evenity   Encourage daily vit D, calcium , and regular weight bearing exercises

## 2023-11-08 NOTE — Assessment & Plan Note (Signed)
 Stable  Follows GI  Med: metamucil

## 2023-11-08 NOTE — Assessment & Plan Note (Addendum)
 Lab Results   Component Value Date    HGBA1C 5.2 10/24/2022     Lab Results   Component Value Date    LDLCALC 115 (H) 10/24/2022    CREATININE 0.75 04/03/2023     A1C ordered  Encouraged to optimize nutrition and physical activity as much as possible

## 2023-11-23 ENCOUNTER — Other Ambulatory Visit: Admit: 2023-11-23 | Discharge: 2023-11-23 | Payer: MEDICARE

## 2023-11-23 DIAGNOSIS — E559 Vitamin D deficiency, unspecified: Principal | ICD-10-CM

## 2023-11-23 NOTE — Progress Notes (Signed)
 Unable to draw. Pt will go to [facility] for lab work

## 2023-11-25 LAB — COMPREHENSIVE METABOLIC PANEL
ALT: 24 IU/L (ref 0–32)
AST: 24 IU/L (ref 0–40)
Albumin: 4.4 g/dL (ref 3.9–4.9)
Alk Phosphatase: 73 IU/L (ref 49–135)
Anion Gap: 10 mmol/L (ref 10.0–18.0)
BUN/Creat Ratio: 18 (ref 12–28)
BUN: 11 mg/dL (ref 8–27)
Bili Total: 0.5 mg/dL (ref 0.0–1.2)
Calcium: 9.1 mg/dL (ref 8.7–10.3)
Carbon Dioxide: 27 mmol/L (ref 20–29)
Chloride: 102 mmol/L (ref 96–106)
Creat: 0.61 mg/dL (ref 0.57–1.00)
Globulin Total: 2.8 g/dL (ref 1.5–4.5)
Glucose: 95 mg/dL (ref 70–99)
Potassium: 4.3 mmol/L (ref 3.5–5.2)
Protein Total: 7.2 g/dL (ref 6.0–8.5)
Sodium: 139 mmol/L (ref 134–144)
eGFR: 96 mL/min/1.73 (ref >59)

## 2023-11-25 LAB — CBC
Hct: 43.5 % (ref 34.0–46.6)
Hgb: 14.3 g/dL (ref 11.1–15.9)
MCH: 29.7 pg (ref 26.6–33.0)
MCHC: 32.9 g/dL (ref 31.5–35.7)
MCV: 90 fL (ref 79–97)
Platelets: 203 x10E3/uL (ref 150–450)
RBC: 4.82 x10E6/uL (ref 3.77–5.28)
RDW: 12.8 % (ref 11.7–15.4)
WBC: 5.2 x10E3/uL (ref 3.4–10.8)

## 2023-11-25 LAB — VITAMIN D 25 HYDROXY: Vitamin D, 25-Hydroxy: 44.7 ng/mL (ref 30.0–100.0)

## 2023-11-25 LAB — HEMOGLOBIN A1C: HgbA1C: 5.4 % (ref 4.8–5.6)

## 2023-11-25 LAB — LIPID PANEL
Cholesterol: 237 mg/dL — ABNORMAL HIGH (ref 100–199)
HDL Cholesterol: 77 mg/dL (ref >39)
LDLc Calc (NIH): 141 mg/dL — ABNORMAL HIGH (ref 0–99)
Non-HDL Chol: 160 mg/dL — ABNORMAL HIGH (ref 0–129)
Triglycerides: 110 mg/dL (ref 0–149)
VLDLc Calc: 19 mg/dL (ref 5–40)

## 2023-11-25 LAB — TSH WITH REFLEX: TSH: 0.723 u[IU]/mL (ref 0.450–4.500)

## 2023-11-28 ENCOUNTER — Ambulatory Visit: Admit: 2023-11-28 | Discharge: 2023-11-28 | Payer: MEDICARE

## 2023-11-28 DIAGNOSIS — M810 Age-related osteoporosis without current pathological fracture: Principal | ICD-10-CM

## 2023-11-28 MED ORDER — ROMOSOZUMAB-AQQG 210 MG/2.34 ML(105 MG/1.17 ML X2)SUBCUTANEOUS SYRINGE
Freq: Once | SUBCUTANEOUS | Status: AC
Start: 2023-11-28 — End: 2023-11-28
  Administered 2023-11-28: 13:00:00 210 mg via SUBCUTANEOUS

## 2023-11-28 MED FILL — ROMOSOZUMAB-AQQG 210 MG/2.34 ML(105 MG/1.17 ML X2)SUBCUTANEOUS SYRINGE: SUBCUTANEOUS | Qty: 2 | Fill #0

## 2023-11-28 NOTE — Progress Notes (Signed)
 [name] is a 68 y.o. in office today for dose #8 of Evenity .  Authorization: per documentation on 07/11/23 (in referrals), no authorization needed for medical buy and bill     Side effects reported from previous doses? denies    Administered 2 syringes subcutaneously.     Next Evenity  dose to be scheduled: 12/26/2023        Evenity  is a prescription medication to treat osteoporosis (thinning and weakening of bone) in women after menopause who are at high risk of fracture (broken bone), and/or cannot use another osteoporosis medicine or other osteoporosis medicines did not work well.     EVENITY  is an injection that will be given to you by your healthcare provider. EVENITY  is injected under your skin (subcutaneous).   You will receive an EVENITY  dose (2 injections) 1 time every month for 12 doses.    You should take calcium  and vitamin D  while you receive EVENITY  (if/as directed by your endocrine provider).   If you miss a dose of EVENITY , contact your healthcare provider as soon as possible to schedule your next dose. Your next dose of EVENITY  should then be scheduled every month from the date of the last injection.   You should take good care of your teeth and gums while you receive EVENITY . You should tell your dentist that you are receiving EVENITY  before you have dental work.     The most common side effects of EVENITY  include:   joint pain    headache  less common side effects include injection site irritation, rash, hair loss, pain with urination, unusual thigh fractures. Risk of severe jaw bone problems (osteonecrosis) is rare. The most common risk factor associated with ONJ is tooth extraction. Call your doctor right away if you think you may be having any of these side effects.

## 2023-12-26 ENCOUNTER — Ambulatory Visit: Admit: 2023-12-26 | Discharge: 2023-12-26 | Payer: MEDICARE

## 2023-12-26 DIAGNOSIS — M810 Age-related osteoporosis without current pathological fracture: Secondary | ICD-10-CM

## 2023-12-26 MED ORDER — ROMOSOZUMAB-AQQG 210 MG/2.34 ML(105 MG/1.17 ML X2)SUBCUTANEOUS SYRINGE
Freq: Once | SUBCUTANEOUS | Status: AC
Start: 2023-12-26 — End: 2023-12-26
  Administered 2023-12-26: 13:00:00 210 mg via SUBCUTANEOUS

## 2023-12-26 MED FILL — ROMOSOZUMAB-AQQG 210 MG/2.34 ML(105 MG/1.17 ML X2)SUBCUTANEOUS SYRINGE: SUBCUTANEOUS | Qty: 2 | Fill #0

## 2023-12-26 NOTE — Progress Notes (Signed)
 [name] is a 68 y.o. in office today for dose #9 of Evenity .  Authorization: per documentation on 07/11/23 (in referrals), no authorization needed for medical buy and bill     Side effects reported from previous doses? denies    Administered 2 syringes subcutaneously.     Next Evenity  dose to be scheduled: 01/23/2024        Evenity  is a prescription medication to treat osteoporosis (thinning and weakening of bone) in women after menopause who are at high risk of fracture (broken bone), and/or cannot use another osteoporosis medicine or other osteoporosis medicines did not work well.     EVENITY  is an injection that will be given to you by your healthcare provider. EVENITY  is injected under your skin (subcutaneous).   You will receive an EVENITY  dose (2 injections) 1 time every month for 12 doses.    You should take calcium  and vitamin D  while you receive EVENITY  (if/as directed by your endocrine provider).   If you miss a dose of EVENITY , contact your healthcare provider as soon as possible to schedule your next dose. Your next dose of EVENITY  should then be scheduled every month from the date of the last injection.   You should take good care of your teeth and gums while you receive EVENITY . You should tell your dentist that you are receiving EVENITY  before you have dental work.     The most common side effects of EVENITY  include:   joint pain    headache  less common side effects include injection site irritation, rash, hair loss, pain with urination, unusual thigh fractures. Risk of severe jaw bone problems (osteonecrosis) is rare. The most common risk factor associated with ONJ is tooth extraction. Call your doctor right away if you think you may be having any of these side effects.

## 2024-01-23 ENCOUNTER — Ambulatory Visit: Admit: 2024-01-23 | Discharge: 2024-01-23 | Payer: MEDICARE

## 2024-01-23 DIAGNOSIS — M810 Age-related osteoporosis without current pathological fracture: Principal | ICD-10-CM

## 2024-01-23 MED ORDER — ROMOSOZUMAB-AQQG 210 MG/2.34 ML(105 MG/1.17 ML X2)SUBCUTANEOUS SYRINGE
Freq: Once | SUBCUTANEOUS | Status: AC
Start: 2024-01-23 — End: 2024-01-23
  Administered 2024-01-23: 13:00:00 210 mg via SUBCUTANEOUS

## 2024-01-23 MED FILL — ROMOSOZUMAB-AQQG 210 MG/2.34 ML(105 MG/1.17 ML X2)SUBCUTANEOUS SYRINGE: SUBCUTANEOUS | Qty: 2 | Fill #0

## 2024-01-23 NOTE — Progress Notes (Signed)
 [name] is a 69 y.o. in office today for dose #10 of Evenity .  Authorization: per documentation on 07/11/23 (in referrals), no authorization needed for medical buy and bill     Side effects reported from previous doses? denies    Administered 2 syringes subcutaneously.     Next Evenity  dose to be scheduled: 02/2024        Evenity  is a prescription medication to treat osteoporosis (thinning and weakening of bone) in women after menopause who are at high risk of fracture (broken bone), and/or cannot use another osteoporosis medicine or other osteoporosis medicines did not work well.     EVENITY  is an injection that will be given to you by your healthcare provider. EVENITY  is injected under your skin (subcutaneous).   You will receive an EVENITY  dose (2 injections) 1 time every month for 12 doses.    You should take calcium  and vitamin D  while you receive EVENITY  (if/as directed by your endocrine provider).   If you miss a dose of EVENITY , contact your healthcare provider as soon as possible to schedule your next dose. Your next dose of EVENITY  should then be scheduled every month from the date of the last injection.   You should take good care of your teeth and gums while you receive EVENITY . You should tell your dentist that you are receiving EVENITY  before you have dental work.     The most common side effects of EVENITY  include:   joint pain    headache  less common side effects include injection site irritation, rash, hair loss, pain with urination, unusual thigh fractures. Risk of severe jaw bone problems (osteonecrosis) is rare. The most common risk factor associated with ONJ is tooth extraction. Call your doctor right away if you think you may be having any of these side effects.

## 2024-02-20 ENCOUNTER — Ambulatory Visit: Payer: MEDICARE
